# Patient Record
Sex: Female | Born: 1945 | Race: White | Hispanic: No | Marital: Married | Smoking: Never smoker
Health system: Southern US, Community
[De-identification: ages and names within clinical notes are randomized; demographics above are authoritative.]

## PROBLEM LIST (undated history)

## (undated) DIAGNOSIS — M1990 Unspecified osteoarthritis, unspecified site: Secondary | ICD-10-CM

## (undated) DIAGNOSIS — I639 Cerebral infarction, unspecified: Secondary | ICD-10-CM

## (undated) DIAGNOSIS — T7840XA Allergy, unspecified, initial encounter: Secondary | ICD-10-CM

## (undated) HISTORY — DX: Unspecified osteoarthritis, unspecified site: M19.90

## (undated) HISTORY — DX: Allergy, unspecified, initial encounter: T78.40XA

## (undated) HISTORY — DX: Cerebral infarction, unspecified: I63.9

---

## 1998-08-07 ENCOUNTER — Ambulatory Visit (HOSPITAL_BASED_OUTPATIENT_CLINIC_OR_DEPARTMENT_OTHER): Admission: RE | Admit: 1998-08-07 | Discharge: 1998-08-07 | Payer: Self-pay | Admitting: Plastic Surgery

## 1999-01-28 ENCOUNTER — Other Ambulatory Visit: Admission: RE | Admit: 1999-01-28 | Discharge: 1999-01-28 | Payer: Self-pay | Admitting: Obstetrics & Gynecology

## 1999-04-08 ENCOUNTER — Emergency Department (HOSPITAL_COMMUNITY): Admission: EM | Admit: 1999-04-08 | Discharge: 1999-04-08 | Payer: Self-pay | Admitting: Emergency Medicine

## 1999-04-08 ENCOUNTER — Encounter: Payer: Self-pay | Admitting: Emergency Medicine

## 2000-03-09 ENCOUNTER — Other Ambulatory Visit: Admission: RE | Admit: 2000-03-09 | Discharge: 2000-03-09 | Payer: Self-pay | Admitting: Obstetrics & Gynecology

## 2000-03-10 ENCOUNTER — Other Ambulatory Visit: Admission: RE | Admit: 2000-03-10 | Discharge: 2000-03-10 | Payer: Self-pay | Admitting: Obstetrics & Gynecology

## 2000-03-10 ENCOUNTER — Encounter (INDEPENDENT_AMBULATORY_CARE_PROVIDER_SITE_OTHER): Payer: Self-pay

## 2001-07-20 ENCOUNTER — Other Ambulatory Visit: Admission: RE | Admit: 2001-07-20 | Discharge: 2001-07-20 | Payer: Self-pay | Admitting: Obstetrics & Gynecology

## 2001-07-27 ENCOUNTER — Ambulatory Visit (HOSPITAL_BASED_OUTPATIENT_CLINIC_OR_DEPARTMENT_OTHER): Admission: RE | Admit: 2001-07-27 | Discharge: 2001-07-27 | Payer: Self-pay | Admitting: Plastic Surgery

## 2001-07-27 ENCOUNTER — Encounter (INDEPENDENT_AMBULATORY_CARE_PROVIDER_SITE_OTHER): Payer: Self-pay | Admitting: *Deleted

## 2002-07-18 ENCOUNTER — Ambulatory Visit (HOSPITAL_BASED_OUTPATIENT_CLINIC_OR_DEPARTMENT_OTHER): Admission: RE | Admit: 2002-07-18 | Discharge: 2002-07-18 | Payer: Self-pay | Admitting: Plastic Surgery

## 2002-07-19 ENCOUNTER — Encounter (INDEPENDENT_AMBULATORY_CARE_PROVIDER_SITE_OTHER): Payer: Self-pay | Admitting: *Deleted

## 2003-01-21 ENCOUNTER — Other Ambulatory Visit: Admission: RE | Admit: 2003-01-21 | Discharge: 2003-01-21 | Payer: Self-pay | Admitting: Obstetrics & Gynecology

## 2004-04-22 ENCOUNTER — Other Ambulatory Visit: Admission: RE | Admit: 2004-04-22 | Discharge: 2004-04-22 | Payer: Self-pay | Admitting: Obstetrics & Gynecology

## 2004-06-19 ENCOUNTER — Ambulatory Visit (HOSPITAL_COMMUNITY): Admission: RE | Admit: 2004-06-19 | Discharge: 2004-06-19 | Payer: Self-pay | Admitting: Obstetrics & Gynecology

## 2004-06-19 ENCOUNTER — Encounter (INDEPENDENT_AMBULATORY_CARE_PROVIDER_SITE_OTHER): Payer: Self-pay | Admitting: Specialist

## 2004-10-26 ENCOUNTER — Other Ambulatory Visit: Admission: RE | Admit: 2004-10-26 | Discharge: 2004-10-26 | Payer: Self-pay | Admitting: Obstetrics & Gynecology

## 2005-04-22 ENCOUNTER — Ambulatory Visit (HOSPITAL_BASED_OUTPATIENT_CLINIC_OR_DEPARTMENT_OTHER): Admission: RE | Admit: 2005-04-22 | Discharge: 2005-04-22 | Payer: Self-pay | Admitting: Plastic Surgery

## 2005-04-22 ENCOUNTER — Encounter (INDEPENDENT_AMBULATORY_CARE_PROVIDER_SITE_OTHER): Payer: Self-pay | Admitting: Specialist

## 2007-01-07 ENCOUNTER — Emergency Department (HOSPITAL_COMMUNITY): Admission: EM | Admit: 2007-01-07 | Discharge: 2007-01-07 | Payer: Self-pay | Admitting: Emergency Medicine

## 2007-01-25 ENCOUNTER — Ambulatory Visit (HOSPITAL_COMMUNITY): Admission: RE | Admit: 2007-01-25 | Discharge: 2007-01-25 | Payer: Self-pay | Admitting: Cardiology

## 2010-02-14 ENCOUNTER — Encounter: Payer: Self-pay | Admitting: Family Medicine

## 2010-06-09 NOTE — Cardiovascular Report (Signed)
Latoya Wright, Latoya Wright                 ACCOUNT NO.:  [id number]   MEDICAL RECORD NO.:  [id number]          PATIENT TYPE:  OIB   LOCATION:  2899                         FACILITY:  [facility]   PHYSICIAN:  Armanda Magic, M.D.     DATE OF BIRTH:  [birth year]   DATE OF PROCEDURE:  01/25/2007  DATE OF DISCHARGE:                            CARDIAC CATHETERIZATION   PROCEDURE PERFORMED:  Left heart catheterization, coronary  angiography,left ventriculography.   INDICATIONS:  Chest pain.   COMPLICATIONS:  None.   IV ACCESS:  Via right femoral artery 6-French sheath.   IV MEDICATIONS:  Fentanyl 50 mcg IV.   INDICATIONS FOR PROCEDURE:  This is a very pleasant 65 year old female  with no prior cardiac history who presented with episodes of substernal  chest pain felt initially to be musculoskeletal but she did not any  better with Motrin.  Stress Cardiolite study did not show any  significant ischemic area as there was a very small fixed defect in the  inferior apex. Given her persistence of chest pain she now presents for  cardiac catheterization.   DESCRIPTION OF PROCEDURE:  The patient is brought to cardiac  catheterization laboratory in a fasting, nonsedated state.  Informed  consent was obtained.  The patient was connected to continuous heart  rate, pulse oximetry monitoring and blood pressure monitor.  The right  groin was prepped and draped in sterile fashion.  A 1% Xylocaine was  used for local anesthesia.  Using modified Seldinger technique a 6-  French sheath was placed in right femoral artery.  Under fluoroscopic  guidance a 6-French JL-4 catheter was placed left coronary artery.  Multiple cine films were taken at 30 degree  RAO, 40 degree LAO views.  Catheter was exchanged out over a guidewire for 6-French JR-4 catheter  which was placed in fluoroscopic guidance the right coronary artery.  Multiple cine films were taken at 30 degree RAO, 40 degree LAO views.  Catheter was exchanged out  over a guidewire for a 6-French angled  pigtail catheter which was placed with fluoroscopic guidance  into the  left ventricular cavity.  Left ventriculography was performed in the 30  degree RAO view using total of 30 mL of contrast at 15 mL per second.  The catheter was then pulled back across the aortic valve with no  significant gradient noted.  At the end procedure all catheters and  sheaths were removed.  Manual compression was performed until adequate  hemostasis was obtained.  The patient transferred back to room in stable  condition.   RESULTS:  1. The left main coronary artery is widely patent and bifurcates into      left anterior descending artery and left circumflex artery.  2. The left anterior descending artery is widely patent throughout its      course.  The apex giving rise to diagonal branches both of which      are widely patent.  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  3. The left  circumflex is widely patent and traverses the AV groove,      it gives rise to a moderate size OM1 branch which bifurcates into      daughter branches and is widely patent.  4. The right coronary artery is widely patent throughout its course      and bifurcates distally in a posterior descending artery and      posterior lateral artery, both of which are widely patent.  5. Left ventriculography shows hyperdynamic LV function, EF 70%, LV      pressure 148 over 3 mmHg, aortic pressure 147/64 mmHg, LVEDP 10      mmHg.   ASSESSMENT:  1. Normal  coronary arteries.  2. Hyperdynamic left ventricle function.  3. 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   PLAN:  Discharge to home after IV fluid and bedrest. Add Toprol XL 25 mg  a day for LV relaxation to help decrease the extent of the myocardial  bridging of  the left anterior descending artery, start Nexium 40 mg a  day.  Follow up with me in 2 weeks.      Armanda Magic, M.D.  Electronically Signed     TT/MEDQ  D:  01/25/2007  T:  01/25/2007  Job:  [id number]

## 2010-06-12 NOTE — Op Note (Signed)
NAMEAINSLIE, MAZUREK                 ACCOUNT NO.:  [id number]   MEDICAL RECORD NO.:  [id number]          PATIENT TYPE:  AMB   LOCATION:  SDC                           FACILITY:  [facility]   PHYSICIAN:  Gerrit Friends. Aldona Bar, M.D.   DATE OF BIRTH:  March 18, 1945   DATE OF PROCEDURE:  06/19/2004  DATE OF DISCHARGE:                                 OPERATIVE REPORT   The patient is age 65.   PREOPERATIVE DIAGNOSIS:  Dysplasia of the cervix.   POSTOPERATIVE DIAGNOSES:  Dysplasia of the cervix.  Pathology pending.   PROCEDURE:  Conization of the cervix.   SURGEON:  Gerrit Friends. Aldona Bar, M.D.   ANESTHESIA:  Intravenous conscious sedation and local with 1% Xylocaine with  epinephrine circumferentially about the cervix.   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 Colposcopy, biopsy and ECC was  carried out in April 2006. 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 She is now being taken to the operating room  for definitive diagnosis and therapy by having conization of the cervix.   DESCRIPTION OF PROCEDURE:  The patient was taken to the operating room and  after the induction of intravenous conscious sedation, she was placed in the  modified lithotomy position short in short Allen stirrups. A speculum was  placed in the vagina, single tooth tenaculum placed on the anterior lip. The  cervix at this time was circumferentially injected with approximately 18 mL  of 1% Xylocaine with epinephrine. At this time using the large loop, the  exocervix was removed and appropriately identified - the anterior portion  the exocervix was removed followed by the posterior portion of the exocervix  and these were pinned out and appropriately labeled. The os was sounded at  this time and using the medium loop, an endocervical button was taken to  likewise appropriately pinned out. The os was noted to be patent and  essentially the endocervix almost was entirely removed.  Thereafter using  the coagulating ball, the cervical bed was adequately coagulated. Hemostasis  was very adequate.  For insurance, Monsel solution was placed in the cervix.  The procedure at this time was felt to be complete and was terminated.  The  patient was taken to the recovery room in satisfactory condition having  tolerated the procedure well. Estimated blood loss negligible. All counts  correct x2. Pathologic specimen consisted of the cone appropriately pinned  out.  The patient will be discharged to home with appropriate instructions and  return to the office for follow-up in approximately three weeks' time or as  needed.   CONDITION ON ARRIVAL TO RECOVERY:  Satisfactory.      RMW/MEDQ  D:  06/19/2004  T:  06/19/2004  Job:  [id number]

## 2010-11-02 LAB — POCT I-STAT CREATININE
Creatinine, Ser: 0.9
Operator id: 196461

## 2010-11-02 LAB — I-STAT 8, (EC8 V) (CONVERTED LAB)
BUN: 14
Chloride: 109
Hemoglobin: 14.6
Operator id: 196461
Sodium: 140

## 2010-11-02 LAB — CBC
HCT: 41
Hemoglobin: 14.1
MCHC: 34.3
MCV: 88.4
Platelets: 290
RBC: 4.64
RDW: 12.6
WBC: 7.3

## 2010-11-02 LAB — POCT CARDIAC MARKERS
Operator id: 196461
Troponin i, poc: <0.05

## 2011-10-11 ENCOUNTER — Other Ambulatory Visit: Payer: Self-pay | Admitting: Plastic Surgery

## 2012-09-06 LAB — HM DEXA SCAN

## 2012-09-12 LAB — HM COLONOSCOPY

## 2013-01-16 LAB — HM MAMMOGRAPHY

## 2013-10-31 ENCOUNTER — Ambulatory Visit (HOSPITAL_COMMUNITY)
Admission: RE | Admit: 2013-10-31 | Discharge: 2013-10-31 | Disposition: A | Discharge status: Home / Self Care | Payer: Medicare Other | Source: Ambulatory Visit | Attending: Emergency Medicine | Admitting: Emergency Medicine

## 2013-10-31 ENCOUNTER — Ambulatory Visit (INDEPENDENT_AMBULATORY_CARE_PROVIDER_SITE_OTHER): Payer: Medicare Other

## 2013-10-31 ENCOUNTER — Ambulatory Visit (INDEPENDENT_AMBULATORY_CARE_PROVIDER_SITE_OTHER): Payer: Medicare Other | Admitting: Emergency Medicine

## 2013-10-31 ENCOUNTER — Encounter (HOSPITAL_COMMUNITY): Payer: Self-pay

## 2013-10-31 VITALS — BP 138/60 | HR 76 | Temp 98.1°F | Resp 16 | Ht 67.5 in | Wt 110.8 lb

## 2013-10-31 DIAGNOSIS — R0781 Pleurodynia: Secondary | ICD-10-CM | POA: Diagnosis not present

## 2013-10-31 DIAGNOSIS — R05 Cough: Secondary | ICD-10-CM

## 2013-10-31 DIAGNOSIS — R7989 Other specified abnormal findings of blood chemistry: Secondary | ICD-10-CM

## 2013-10-31 DIAGNOSIS — R059 Cough, unspecified: Secondary | ICD-10-CM

## 2013-10-31 DIAGNOSIS — J040 Acute laryngitis: Secondary | ICD-10-CM

## 2013-10-31 DIAGNOSIS — R791 Abnormal coagulation profile: Secondary | ICD-10-CM

## 2013-10-31 LAB — POCT CBC
GRANULOCYTE PERCENT: 76.1 % (ref 37–80)
HEMATOCRIT: 37.7 % (ref 37.7–47.9)
HEMOGLOBIN: 12.1 g/dL — AB (ref 12.2–16.2)
Lymph, poc: 1.9 (ref 0.6–3.4)
MCH, POC: 29 pg (ref 27–31.2)
MCHC: 32.2 g/dL (ref 31.8–35.4)
MCV: 90 fL (ref 80–97)
MID (cbc): 0.8 (ref 0–0.9)
MPV: 8.3 fL (ref 0–99.8)
POC GRANULOCYTE: 8.5 — AB (ref 2–6.9)
POC LYMPH PERCENT: 16.7 %L (ref 10–50)
POC MID %: 7.2 % (ref 0–12)
Platelet Count, POC: 210 10*3/uL (ref 142–424)
RBC: 4.19 M/uL (ref 4.04–5.48)
RDW, POC: 14.4 %
WBC: 11.2 10*3/uL — AB (ref 4.6–10.2)

## 2013-10-31 LAB — D-DIMER, QUANTITATIVE (NOT AT ARMC): D DIMER QUANT: 1.13 ug{FEU}/mL — AB (ref 0.00–0.48)

## 2013-10-31 LAB — POCT I-STAT CREATININE: CREATININE: 0.7 mg/dL (ref 0.50–1.10)

## 2013-10-31 MED ORDER — IOHEXOL 350 MG/ML SOLN
100.0000 mL | Freq: Once | INTRAVENOUS | Status: AC | PRN
  Administered 2013-10-31: 100 mL via INTRAVENOUS

## 2013-10-31 MED ORDER — BENZONATATE 100 MG PO CAPS: 100.0000 mg | ORAL_CAPSULE | Freq: Three times a day (TID) | ORAL | Status: DC | PRN

## 2013-10-31 MED ORDER — LEVOFLOXACIN 500 MG PO TABS: 500.0000 mg | ORAL_TABLET | Freq: Every day | ORAL | Status: AC

## 2013-10-31 NOTE — Patient Instructions (Signed)
Cough, Adult  A cough is a reflex that helps clear your throat and airways. It can help heal the body or may be a reaction to an irritated airway. A cough may only last 2 or 3 weeks (acute) or may last more than 8 weeks (chronic).  CAUSES Acute cough:  Viral or bacterial infections. Chronic cough:  Infections.  Allergies.  Asthma.  Post-nasal drip.  Smoking.  Heartburn or acid reflux.  Some medicines.  Chronic lung problems (COPD).  Cancer. SYMPTOMS   Cough.  Fever.  Chest pain.  Increased breathing rate.  High-pitched whistling sound when breathing (wheezing).  Colored mucus that you cough up (sputum). TREATMENT   A bacterial cough may be treated with antibiotic medicine.  A viral cough must run its course and will not respond to antibiotics.  Your caregiver may recommend other treatments if you have a chronic cough. HOME CARE INSTRUCTIONS   Only take over-the-counter or prescription medicines for pain, discomfort, or fever as directed by your caregiver. Use cough suppressants only as directed by your caregiver.  Use a cold steam vaporizer or humidifier in your bedroom or home to help loosen secretions.  Sleep in a semi-upright position if your cough is worse at night.  Rest as needed.  Stop smoking if you smoke. SEEK IMMEDIATE MEDICAL CARE IF:   You have pus in your sputum.  Your cough starts to worsen.  You cannot control your cough with suppressants and are losing sleep.  You begin coughing up blood.  You have difficulty breathing.  You develop pain which is getting worse or is uncontrolled with medicine.  You have a fever. MAKE SURE YOU:   Understand these instructions.  Will watch your condition.  Will get help right away if you are not doing well or get worse. Document Released: 07/10/2010 Document Revised: 04/05/2011 Document Reviewed: 07/10/2010 [facility] Patient Information 2015 [place], [place]. This information is not intended  to replace advice given to you by your health care provider. Make sure you discuss any questions you have with your health care provider. Pleurisy Pleurisy is an inflammation and swelling of the lining of the lungs (pleura). Because of this inflammation, it hurts to breathe. It can be aggravated by coughing, laughing, or deep breathing. Pleurisy is often caused by an underlying infection or disease.  HOME CARE INSTRUCTIONS  Monitor your pleurisy for any changes. The following actions may help to alleviate any discomfort you are experiencing:  Medicine may help with pain. Only take over-the-counter or prescription medicines for pain, discomfort, or fever as directed by your health care provider.  Only take antibiotic medicine as directed. Make sure to finish it even if you start to feel better. SEEK MEDICAL CARE IF:   Your pain is not controlled with medicine or is increasing.  You have an increase in pus-like (purulent) secretions brought up with coughing. SEEK IMMEDIATE MEDICAL CARE IF:   You have blue or dark lips, fingernails, or toenails.  You are coughing up blood.  You have increased difficulty breathing.  You have continuing pain unrelieved by medicine or pain lasting more than 1 week.  You have pain that radiates into your neck, arms, or jaw.  You develop increased shortness of breath or wheezing.  You develop a fever, rash, vomiting, fainting, or other serious symptoms. MAKE SURE YOU:  Understand these instructions.   Will watch your condition.   Will get help right away if you are not doing well or get worse.  Document Released:  01/11/2005 Document Revised: 09/13/2012 Document Reviewed: 06/25/2012 [facility] Patient Information 2015 [place], [place]. This information is not intended to replace advice given to you by your health care provider. Make sure you discuss any questions you have with your health care provider.

## 2013-10-31 NOTE — Progress Notes (Signed)
Subjective:    Patient ID: Latoya Wright, female    DOB: 10-04-1945, 68 y.o.   MRN: [id number] This chart was scribed for Arlyss Queen, MD by Marti Sleigh, Medical Scribe. This patient was seen in Room 13 and the patient's care was started at 10:55 AM.  HPI HPI Comments: Latoya Wright is a 68 y.o. female who presents to [facility] complaining of constant right-sided chest pain that started last night. Pt states she had sinus congestion and lost her voice on three days ago. Pt states that she also experienced severe constipation that started two weeks ago, and has since improved with regular laxatives. Pt states she is experiencing SOB as an associated symptom of her chest pain. Pt denies long travel by car/train/plane, leg swelling, fever, or chills. Pt endorses sick contacts with two young children.    Review of Systems  Constitutional: Negative for chills, diaphoresis, appetite change and fatigue.  HENT: Positive for congestion, rhinorrhea, sinus pressure and voice change. Negative for ear discharge.   Eyes: Negative for discharge.  Respiratory: Negative for cough.   Cardiovascular: Negative for chest pain.  Gastrointestinal: Positive for abdominal pain (Mild) and constipation. Negative for diarrhea.  Genitourinary: Negative for frequency and hematuria.  Musculoskeletal: Negative for back pain.  Skin: Negative for rash.  Neurological: Negative for seizures and headaches.  Psychiatric/Behavioral: Negative for hallucinations.       Objective:   Physical Exam  Constitutional: She is oriented to person, place, and time. She appears well-developed and well-nourished. No distress.  HENT:  Head: Normocephalic.  Eyes: Conjunctivae and EOM are normal. No scleral icterus.  Neck: Neck supple. No thyromegaly present.  Cardiovascular: Normal rate and regular rhythm.  Exam reveals no gallop and no friction rub.   No murmur heard. Pulmonary/Chest: No stridor.  Decreased breath sounds on right, with  occasional rhonchi.  Abdominal: She exhibits no distension. There is no tenderness. There is no rebound.  Musculoskeletal: Normal range of motion. She exhibits no edema.  Lymphadenopathy:    She has no cervical adenopathy.  Neurological: She is oriented to person, place, and time. She exhibits normal muscle tone. Coordination normal.  Skin: No rash noted. No erythema.  Psychiatric: She has a normal mood and affect. Her behavior is normal.   Results for orders placed in visit on 10/31/13  POCT CBC      Result Value Ref Range   WBC 11.2 (*) 4.6 - 10.2 K/uL   Lymph, poc 1.9  0.6 - 3.4   POC LYMPH PERCENT 16.7  10 - 50 %L   MID (cbc) 0.8  0 - 0.9   POC MID % 7.2  0 - 12 %M   POC Granulocyte 8.5 (*) 2 - 6.9   Granulocyte percent 76.1  37 - 80 %G   RBC 4.19  4.04 - 5.48 M/uL   Hemoglobin 12.1 (*) 12.2 - 16.2 g/dL   HCT, POC 37.7  37.7 - 47.9 %   MCV 90.0  80 - 97 fL   MCH, POC 29.0  27 - 31.2 pg   MCHC 32.2  31.8 - 35.4 g/dL   RDW, POC 14.4     Platelet Count, POC 210  142 - 424 K/uL   MPV 8.3  0 - 99.8 fL   [facility] reading (PRIMARY) by  Dr.Tishawn Friedhoff there is increased AP diameter. There are increased markings seen on the lateral with what appears to be scarring in the right apices. Please comment. She is not a  smoker        Assessment & Plan:  I suspect the patient has a bowel illness with Thursday. We'll do a d-dimer to be sure there is no clot. She will be treated with Tessalon Perles fluids and Aleve 2 twice a day. She will followup with her PCP and pulmonary  function testing done in about 2 weeks. I personally performed the services described in this documentation, which was scribed in my presence. The recorded information has been reviewed and is accurate. D. dimer which was done returned elevated patient sent for CT angiogram to rule out clot  CT scan shows multiple pulmonary nodules with underlying emphysema. There is a infiltrate in the right middle lobe and both lower lobes. 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 I placed pt on Levaquin 500 qd #10.   Pt will followup with Dr. Laney Pastor on Friday if not improving.Marland Kitchen She will return to clinic sooner if worsening.

## 2013-11-01 ENCOUNTER — Telehealth: Payer: Self-pay | Admitting: Emergency Medicine

## 2013-11-01 NOTE — Telephone Encounter (Signed)
Called and spoke with the patient. sHe is feeling much better today. She will followup tomorrow if worsening. if she continues to do well I will see her on Monday

## 2013-11-05 ENCOUNTER — Ambulatory Visit (INDEPENDENT_AMBULATORY_CARE_PROVIDER_SITE_OTHER): Payer: Medicare Other

## 2013-11-05 ENCOUNTER — Ambulatory Visit (INDEPENDENT_AMBULATORY_CARE_PROVIDER_SITE_OTHER): Payer: Medicare Other | Admitting: Emergency Medicine

## 2013-11-05 VITALS — BP 132/68 | HR 91 | Temp 97.8°F | Resp 18 | Ht 67.5 in | Wt 110.0 lb

## 2013-11-05 DIAGNOSIS — J438 Other emphysema: Secondary | ICD-10-CM | POA: Insufficient documentation

## 2013-11-05 DIAGNOSIS — J189 Pneumonia, unspecified organism: Secondary | ICD-10-CM

## 2013-11-05 DIAGNOSIS — R160 Hepatomegaly, not elsewhere classified: Secondary | ICD-10-CM

## 2013-11-05 DIAGNOSIS — E041 Nontoxic single thyroid nodule: Secondary | ICD-10-CM

## 2013-11-05 LAB — POCT CBC
GRANULOCYTE PERCENT: 74.2 % (ref 37–80)
HEMATOCRIT: 40.3 % (ref 37.7–47.9)
HEMOGLOBIN: 12.7 g/dL (ref 12.2–16.2)
LYMPH, POC: 1.5 (ref 0.6–3.4)
MCH, POC: 28.6 pg (ref 27–31.2)
MCHC: 31.6 g/dL — AB (ref 31.8–35.4)
MCV: 90.4 fL (ref 80–97)
MID (cbc): 0.5 (ref 0–0.9)
MPV: 7.5 fL (ref 0–99.8)
POC GRANULOCYTE: 5.8 (ref 2–6.9)
POC LYMPH %: 19.8 % (ref 10–50)
POC MID %: 6 %M (ref 0–12)
Platelet Count, POC: 329 10*3/uL (ref 142–424)
RBC: 4.45 M/uL (ref 4.04–5.48)
RDW, POC: 14.2 %
WBC: 7.8 10*3/uL (ref 4.6–10.2)

## 2013-11-05 LAB — POCT SEDIMENTATION RATE: POCT SED RATE: 40 mm/h — AB (ref 0–22)

## 2013-11-05 LAB — ANGIOTENSIN CONVERTING ENZYME: ANGIOTENSIN-CONVERTING ENZYME: 40 U/L (ref 8–52)

## 2013-11-05 NOTE — Progress Notes (Signed)
Subjective:    Patient ID: Latoya Wright, female    DOB: May 24, 1945, 68 y.o.   MRN: [id number]  This chart was scribed for Arlyss Queen, MD by Marti Sleigh, Medical Scribe. This patient was seen in Room 2 and the patient's care was started at 9:07 AM.   HPI HPI Comments: Latoya Wright is a 68 y.o. female who presents to [facility] for a follow up appointment. Pt complains of continued cough that is worst at night. Pt endorses intermittent CP, and coughing up purulent drainage in the morning, as associated symptoms. Pt states she has been compliant with her abx medication. Pt denies SOB. Pt states she is taking Biotin 1000 mg, Calcium 1200 mg, Vitimin D3 2000 iu, all daily.    Review of Systems  Constitutional: Negative for fever.  HENT: Positive for congestion and voice change.   Respiratory: Positive for chest tightness. Negative for shortness of breath.   Cardiovascular: Positive for chest pain. Negative for leg swelling.  Gastrointestinal: Negative for constipation.  Genitourinary: Negative for dysuria and difficulty urinating.  Musculoskeletal: Negative for neck pain.  Skin: Negative for rash.  Neurological: Negative for numbness and headaches.  Psychiatric/Behavioral: Negative for agitation.       Objective:   Physical Exam  Nursing note and vitals reviewed. Constitutional: She is oriented to person, place, and time. She appears well-developed and well-nourished.  HENT:  Head: Normocephalic and atraumatic.  Eyes: Pupils are equal, round, and reactive to light.  Neck: No JVD present.  No thyroid nodule pappable.  Cardiovascular: Normal rate, regular rhythm and normal heart sounds.   Pulmonary/Chest: Effort normal and breath sounds normal.  Dry rales in both bases.  Neurological: She is alert and oriented to person, place, and time.  Skin: Skin is warm and dry.  Psychiatric: She has a normal mood and affect. Her behavior is normal.   Results for orders placed in visit on 11/05/13    POCT CBC      Result Value Ref Range   WBC 7.8  4.6 - 10.2 K/uL   Lymph, poc 1.5  0.6 - 3.4   POC LYMPH PERCENT 19.8  10 - 50 %L   MID (cbc) 0.5  0 - 0.9   POC MID % 6.0  0 - 12 %M   POC Granulocyte 5.8  2 - 6.9   Granulocyte percent 74.2  37 - 80 %G   RBC 4.45  4.04 - 5.48 M/uL   Hemoglobin 12.7  12.2 - 16.2 g/dL   HCT, POC 40.3  37.7 - 47.9 %   MCV 90.4  80 - 97 fL   MCH, POC 28.6  27 - 31.2 pg   MCHC 31.6 (*) 31.8 - 35.4 g/dL   RDW, POC 14.2     Platelet Count, POC 329  142 - 424 K/uL   MPV 7.5  0 - 99.8 fL   [facility] reading (PRIMARY) by  Dr. Everlene Farrier this shows changes of COPD there is a minimal right middle lobe infiltrate adjacent to the lower right heart border       Assessment & Plan:  Patient is improved on Levaquin and Tessalon Perles. 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 The recorded information has been reviewed and is accurate.

## 2013-11-07 LAB — ALPHA-1-ANTITRYPSIN: A-1 Antitrypsin, Ser: 185 mg/dL (ref 83–199)

## 2013-11-09 ENCOUNTER — Ambulatory Visit (INDEPENDENT_AMBULATORY_CARE_PROVIDER_SITE_OTHER): Payer: Medicare Other | Admitting: Pulmonary Disease

## 2013-11-09 ENCOUNTER — Encounter: Payer: Self-pay | Admitting: Pulmonary Disease

## 2013-11-09 VITALS — BP 122/64 | HR 78 | Ht 68.25 in | Wt 114.0 lb

## 2013-11-09 DIAGNOSIS — R059 Cough, unspecified: Secondary | ICD-10-CM

## 2013-11-09 DIAGNOSIS — J189 Pneumonia, unspecified organism: Secondary | ICD-10-CM

## 2013-11-09 DIAGNOSIS — R05 Cough: Secondary | ICD-10-CM

## 2013-11-09 DIAGNOSIS — J438 Other emphysema: Secondary | ICD-10-CM

## 2013-11-09 NOTE — Assessment & Plan Note (Signed)
Voice rest encouraged

## 2013-11-09 NOTE — Progress Notes (Signed)
Subjective:    Patient ID: Latoya Wright, female    DOB: December 31, 1945, 68 y.o.   MRN: [id number]  HPI Chief Complaint  Patient presents with  . Advice Only    Referred by Latoya Wright for pna Xfew weeks.    Latoya Wright is here to see me for pneumonia.  She had a CT scan recently that showed pneumonia so she was referred to me for the same.  She had a list of other abnormalities from the CT as well..  She started having a hacking cough about 2-3 weeks ago and she had some hoarseness.  She had been to [facility] urgent care at the time for abdominal complaints around this time.  The abdominal complaints resolved but the sinus congestion, hoarseness and cough persisted.  Then suddenly last Tuesday she developed the sudden onset of pressure throughout her right chest.  She had a Chest X-ray that showed pneumonia.  She then had a CT angiogram of her chest that was negative for PE but showed RML pneumonia and possibly emphysema and mild bronchiectasis.  She has had complete resolution of her symptoms with the exception of a mild nagging dry cough.    She had pneumonia many years ago about thirty years ago but was not hospitalized for this.    Her childhood was normal without respiratory illnesses.   Past Medical History  Diagnosis Date  . Allergy   . Arthritis      Family History  Problem Relation Age of Onset  . Heart disease Father   . Heart disease Maternal Grandmother   . Heart disease Maternal Grandfather   . Heart disease Paternal Grandmother   . Heart disease Paternal Grandfather   . Rheum arthritis Sister   . Rheum arthritis Brother      History   Social History  . Marital Status: Married    Spouse Name: N/A    Number of Children: N/A  . Years of Education: N/A   Occupational History  . Not on file.   Social History Main Topics  . Smoking status: Never Smoker   . Smokeless tobacco: Never Used  . Alcohol Use: No  . Drug Use: No  . Sexual Activity: Not on file   Other  Topics Concern  . Not on file   Social History Narrative  . No narrative on file     Allergies  Allergen Reactions  . Dayquil [Pseudoephedrine-Apap-Dm]     jittery     Outpatient Prescriptions Prior to Visit  Medication Sig Dispense Refill  . aspirin 81 MG tablet Take 81 mg by mouth daily.      . benzonatate (TESSALON) 100 MG capsule Take 1-2 capsules (100-200 mg total) by mouth 3 (three) times daily as needed for cough.  40 capsule  0  . Biotin 1 MG CAPS Take by mouth.      . Calcium Carbonate-Vit D-Min (CALCIUM 1200 PO) Take by mouth.      . levofloxacin (LEVAQUIN) 500 MG tablet Take 1 tablet (500 mg total) by mouth daily.  10 tablet  0   No facility-administered medications prior to visit.       Review of Systems  Constitutional: Negative for fever and unexpected weight change.  HENT: Negative for congestion, dental problem, ear pain, nosebleeds, postnasal drip, rhinorrhea, sinus pressure, sneezing, sore throat and trouble swallowing.   Eyes: Negative for redness and itching.  Respiratory: Positive for cough. Negative for chest tightness, shortness of breath and wheezing.  Cardiovascular: Negative for palpitations and leg swelling.  Gastrointestinal: Negative for nausea and vomiting.  Genitourinary: Negative for dysuria.  Musculoskeletal: Negative for joint swelling.  Skin: Negative for rash.  Neurological: Negative for headaches.  Hematological: Does not bruise/bleed easily.  Psychiatric/Behavioral: Negative for dysphoric mood. The patient is not nervous/anxious.        Objective:   Physical Exam Filed Vitals:   11/09/13 1118  BP: 122/64  Pulse: 78  Height: 5' 8.25" (1.734 m)  Weight: 114 lb (51.71 kg)  SpO2: 100%    RA  Gen: well appearing, no acute distress HEENT: NCAT, PERRL, EOMi, OP clear, neck supple without masses PULM: CTA B CV: RRR, no mgr, no JVD AB: BS+, soft, nontender, no hsm Ext: warm, no edema, no clubbing, no cyanosis Derm: no rash or  skin breakdown Neuro: A&Ox4, CN II-XII intact, strength 5/5 in all 4 extremities  10/2013 CXR and CT chest reviewed> RML pneumonia, RLL mild ground glass and nodularity in one focal "patch", no clear emphysema or significant bronchiectasis per my review      Assessment & Plan:   Other emphysema I really cannot appreciate much bronchiectasis or emphysema on the CT chest as suggested by radiology. If she has either they are subtle.  If she does have bronchiectasis it is very subtle and likely due to a case of pneumonia she had about 25 years ago.  Because she is healthy aside from the recent pneumonia I don't see an indication to work this up further aside from an alpha-1 level.  Plan: -send alpha-1 anti-trypsin  CAP (community acquired pneumonia) She had right middle lobe pneumonia seen on her recent chest x-ray.  Currently her symptoms fit the normal natural history of community acquired pneumonia and she is currently getting better.  Looking back, it appears that she had pneumonia in 2009 in the right middle lobe as well.  I wonder if we are looking at a scar that is just not getting better.  It can take weeks for pneumonia to resolve on a chest x-ray.  Plan: -repeat CXR in 2-3 weeks then follow up with me after that -if scar still there, discuss serial CT Scans vs bronchoscopy  Cough Voice rest encouraged    Updated Medication List Outpatient Encounter Prescriptions as of 11/09/2013  Medication Sig  . aspirin 81 MG tablet Take 81 mg by mouth daily.  . benzonatate (TESSALON) 100 MG capsule Take 1-2 capsules (100-200 mg total) by mouth 3 (three) times daily as needed for cough.  . Biotin 1 MG CAPS Take by mouth.  . Calcium Carbonate-Vit D-Min (CALCIUM 1200 PO) Take by mouth.  . cholecalciferol (VITAMIN D) 1000 UNITS tablet Take 2,000 Units by mouth daily.  Marland Kitchen levofloxacin (LEVAQUIN) 500 MG tablet Take 1 tablet (500 mg total) by mouth daily.

## 2013-11-09 NOTE — Assessment & Plan Note (Addendum)
I really cannot appreciate much bronchiectasis or emphysema on the CT chest as suggested by radiology. If she has either they are subtle.  If she does have bronchiectasis it is very subtle and likely due to a case of pneumonia she had about 25 years ago.  Because she is healthy aside from the recent pneumonia I don't see an indication to work this up further aside from an alpha-1 level.  Plan: -send alpha-1 anti-trypsin

## 2013-11-09 NOTE — Patient Instructions (Signed)
We will get a chest x-ray at the end of the month. We will see you back after that.

## 2013-11-09 NOTE — Assessment & Plan Note (Signed)
She had right middle lobe pneumonia seen on her recent chest x-ray.  Currently her symptoms fit the normal natural history of community acquired pneumonia and she is currently getting better.  Looking back, it appears that she had pneumonia in 2009 in the right middle lobe as well.  I wonder if we are looking at a scar that is just not getting better.  It can take weeks for pneumonia to resolve on a chest x-ray.  Plan: -repeat CXR in 2-3 weeks then follow up with me after that -if scar still there, discuss serial CT Scans vs bronchoscopy

## 2013-11-19 ENCOUNTER — Ambulatory Visit
Admission: RE | Admit: 2013-11-19 | Discharge: 2013-11-19 | Disposition: A | Discharge status: Home / Self Care | Payer: Medicare Other | Source: Ambulatory Visit | Attending: Emergency Medicine | Admitting: Emergency Medicine

## 2013-11-19 ENCOUNTER — Ambulatory Visit
Admission: RE | Admit: 2013-11-19 | Discharge: 2013-11-19 | Disposition: A | Discharge status: Home / Self Care | Payer: 59 | Source: Ambulatory Visit | Attending: Emergency Medicine | Admitting: Emergency Medicine

## 2013-11-19 ENCOUNTER — Other Ambulatory Visit: Payer: Self-pay | Admitting: Radiology

## 2013-11-19 DIAGNOSIS — R935 Abnormal findings on diagnostic imaging of other abdominal regions, including retroperitoneum: Secondary | ICD-10-CM

## 2013-11-19 DIAGNOSIS — E041 Nontoxic single thyroid nodule: Secondary | ICD-10-CM

## 2013-11-19 DIAGNOSIS — R160 Hepatomegaly, not elsewhere classified: Secondary | ICD-10-CM

## 2013-11-21 ENCOUNTER — Ambulatory Visit (INDEPENDENT_AMBULATORY_CARE_PROVIDER_SITE_OTHER)
Admission: RE | Admit: 2013-11-21 | Discharge: 2013-11-21 | Disposition: A | Discharge status: Home / Self Care | Payer: Medicare Other | Source: Ambulatory Visit | Attending: Pulmonary Disease | Admitting: Pulmonary Disease

## 2013-11-21 DIAGNOSIS — J438 Other emphysema: Secondary | ICD-10-CM

## 2013-11-27 ENCOUNTER — Encounter: Payer: Self-pay | Admitting: Pulmonary Disease

## 2013-11-27 ENCOUNTER — Ambulatory Visit (INDEPENDENT_AMBULATORY_CARE_PROVIDER_SITE_OTHER): Payer: Medicare Other | Admitting: Pulmonary Disease

## 2013-11-27 VITALS — BP 136/70 | HR 67 | Temp 98.0°F | Ht 68.0 in | Wt 116.0 lb

## 2013-11-27 DIAGNOSIS — J189 Pneumonia, unspecified organism: Secondary | ICD-10-CM

## 2013-11-27 NOTE — Patient Instructions (Signed)
We will see you back in a year with a chest x-ray If you have a case of bronchitis between now and then come back to see [place]

## 2013-11-27 NOTE — Progress Notes (Signed)
   Subjective:    Patient ID: MILYN STAPLETON, female    DOB: 1946/01/19, 68 y.o.   MRN: [id number]  Synopsis: Referred in 2015 for evaluation of right middle lobe and right lower lobe pneumonia. Per radiology there is a suggestion of bronchiectasis and emphysema, however these changes were subtle and not evident on my review (McQuaid). Alpha-1 antitrypsin testing sent and was normal. Lifelong nonsmoker.  HPI  Chief Complaint  Patient presents with  . Follow-up    Pt denies cough, SOB. Pt still has some hoarseness.    11/27/2013 routine follow-up> Mrs. Stonesifer has been doing well since the last visit. She has no shortness of breath, no cough.  She has been active and is feeling well.she does not have chest pain, her weight has been stable.  Past Medical History  Diagnosis Date  . Allergy   . Arthritis      Review of Systems     Objective:   Physical Exam Filed Vitals:   11/27/13 1520  BP: 136/70  Pulse: 67  Temp: 98 F (36.7 C)  TempSrc: Oral  Height: 5\' 8"  (1.727 m)  Weight: 116 lb (52.617 kg)  SpO2: 100%  RA  Gen: well appearing, no acute distress HEENT: NCAT, EOMi, OP clear,  PULM: CTA B CV: RRR, no mgr, no JVD AB: BS+, soft, nontender Ext: warm, no edema, no clubbing, no cyanosis Derm: no rash or skin breakdown Neuro: A&Ox4, MAEW  11/21/2013 CXR> near complete resolution of RML pneumonia      Assessment & Plan:   CAP (community acquired pneumonia) This problem has resolved. She has a mild scar in her right lung which I believe is related to an episode of pneumonia 25 years ago. I do not believe she has bronchiectasis or emphysema as these were not evident to me on her chest CT and her alpha-1 antitrypsin level was normal.  Because of the mild scar in her right lung I would like for her to follow-up in one year with a repeat chest x-ray to make sure that there is no evidence of change.    Updated Medication List Outpatient Encounter Prescriptions as of  11/27/2013  Medication Sig  . aspirin 81 MG tablet Take 81 mg by mouth daily.  . Biotin 1 MG CAPS Take by mouth.  . Calcium Carbonate-Vit D-Min (CALCIUM 1200 PO) Take by mouth.  . cholecalciferol (VITAMIN D) 1000 UNITS tablet Take 2,000 Units by mouth daily.  . benzonatate (TESSALON) 100 MG capsule Take 1-2 capsules (100-200 mg total) by mouth 3 (three) times daily as needed for cough.

## 2013-11-27 NOTE — Progress Notes (Signed)
Quick Note:  Spoke with pt, she is aware of results. ______

## 2013-11-27 NOTE — Assessment & Plan Note (Signed)
This problem has resolved. She has a mild scar in her right lung which I believe is related to an episode of pneumonia 25 years ago. I do not believe she has bronchiectasis or emphysema as these were not evident to me on her chest CT and her alpha-1 antitrypsin level was normal.  Because of the mild scar in her right lung I would like for her to follow-up in one year with a repeat chest x-ray to make sure that there is no evidence of change.

## 2013-12-04 ENCOUNTER — Ambulatory Visit
Admission: RE | Admit: 2013-12-04 | Discharge: 2013-12-04 | Disposition: A | Discharge status: Home / Self Care | Payer: 59 | Source: Ambulatory Visit | Attending: Emergency Medicine | Admitting: Emergency Medicine

## 2013-12-04 DIAGNOSIS — R935 Abnormal findings on diagnostic imaging of other abdominal regions, including retroperitoneum: Secondary | ICD-10-CM

## 2013-12-04 MED ORDER — GADOBENATE DIMEGLUMINE 529 MG/ML IV SOLN
10.0000 mL | Freq: Once | INTRAVENOUS | Status: AC | PRN
  Administered 2013-12-04: 10 mL via INTRAVENOUS

## 2013-12-10 ENCOUNTER — Telehealth: Payer: Self-pay

## 2013-12-10 NOTE — Telephone Encounter (Signed)
When do you recommend follow up?

## 2013-12-10 NOTE — Telephone Encounter (Signed)
Pt will be in on Wednesday to see Dr. Everlene Farrier

## 2013-12-10 NOTE — Telephone Encounter (Signed)
Maybe she can come in Wednesday or this weekend and we can review everything that is been done and arrange for follow-up

## 2013-12-10 NOTE — Telephone Encounter (Signed)
Patient wants to know if Dr. Everlene Farrier needs her to RTC after her latest round of CT Scans and tests.    [phone]

## 2013-12-12 ENCOUNTER — Ambulatory Visit (INDEPENDENT_AMBULATORY_CARE_PROVIDER_SITE_OTHER): Payer: Medicare Other | Admitting: Emergency Medicine

## 2013-12-12 ENCOUNTER — Encounter: Payer: Self-pay | Admitting: Emergency Medicine

## 2013-12-12 VITALS — BP 120/77 | HR 72 | Temp 98.6°F | Resp 12 | Ht 67.75 in | Wt 114.0 lb

## 2013-12-12 DIAGNOSIS — J189 Pneumonia, unspecified organism: Secondary | ICD-10-CM

## 2013-12-12 DIAGNOSIS — Z23 Encounter for immunization: Secondary | ICD-10-CM

## 2013-12-12 DIAGNOSIS — E079 Disorder of thyroid, unspecified: Secondary | ICD-10-CM

## 2013-12-12 DIAGNOSIS — D1803 Hemangioma of intra-abdominal structures: Secondary | ICD-10-CM

## 2013-12-12 DIAGNOSIS — E041 Nontoxic single thyroid nodule: Secondary | ICD-10-CM

## 2013-12-12 LAB — T4, FREE: FREE T4: 0.82 ng/dL (ref 0.80–1.80)

## 2013-12-12 LAB — TSH: TSH: 0.535 u[IU]/mL (ref 0.350–4.500)

## 2013-12-12 NOTE — Progress Notes (Signed)
   Subjective:  This chart was scribed for Darlyne Russian, MD by Ladene Artist, ED Scribe. The patient was seen in room 13. Patient's care was started at 8:44 AM.   Patient ID: Latoya Wright, female    DOB: 03-16-1945, 68 y.o.   MRN: [id number]  Chief Complaint  Patient presents with  . Follow-up    follow up from CT, MRI referral visits for PNA, blood clot, liver issues   HPI HPI Comments: Latoya Wright is a 68 y.o. female who presents to the [facility] for follow-up regarding CT. Pt had MRI of abdomen and [place] of neck done. 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 Pt denies family h/o thyroid disease. She also denies major exposure to radiation or extensive dental work. Pt was also referred to pulmonologist Dr. Lake Bells. Pt reports that she was told that pneumonia had cleared up. He advises that pt have another CXR and [place] in 1 year. Pt reports that she is doing fine overall. She denies SOB or any other complications at this time.   Immunizations  Pt states that she does not get the flu vaccine. Pt has had shingle vaccine. She reports that she had previous pneumococcal vaccine a few years ago. Pt has not had Prevnar but wishes to get vaccine today.   Preventative Maintenance  Pt is scheduled to see GYN in January 2016.   Past Medical History  Diagnosis Date  . Allergy   . Arthritis    Current Outpatient Prescriptions on File Prior to Visit  Medication Sig Dispense Refill  . aspirin 81 MG tablet Take 81 mg by mouth daily.    . Biotin 1 MG CAPS Take by mouth.    . Calcium Carbonate-Vit D-Min (CALCIUM 1200 PO) Take by mouth.     No current facility-administered medications on file prior to visit.   Allergies  Allergen Reactions  . Dayquil [Pseudoephedrine-Apap-Dm]     jittery    Review of Systems  Constitutional: Negative for fever.  Respiratory: Negative  for shortness of breath.       Objective:   Physical Exam CONSTITUTIONAL: Well developed/well nourished HEAD: Normocephalic/atraumatic EYES: EOMI/PERRL ENMT: Mucous membranes moist NECK: supple no meningeal signs, no thyroid nodule palpable  SPINE/BACK:entire spine nontender CV: S1/S2 noted, no murmurs/rubs/gallops noted LUNGS: Lungs are clear to auscultation bilaterally, no apparent distress ABDOMEN: soft, nontender, no rebound or guarding, bowel sounds noted throughout abdomen GU:no cva tenderness NEURO: Pt is awake/alert/appropriate, moves all extremitiesx4.  No facial droop.   EXTREMITIES: pulses normal/equal, full ROM SKIN: warm, color normal PSYCH: no abnormalities of mood noted, alert and oriented to situation    Assessment & Plan:  Pt doing well post treatment for pneumonia. She has had pulmonary follow-up with Dr. Lake Bells. Her MRI of abdomen showed hemangioma of liver and benign renal lesion but no suspicious lesion. [place] of neck showed dominant R sided nodule. [place] recommended to be repeated in 1 year. Prevnar to be given today. She has had the previous pneumococcal vaccine.   I personally performed the services described in this documentation, which was scribed in my presence. The recorded information has been reviewed and is accurate.

## 2013-12-31 ENCOUNTER — Telehealth: Payer: Self-pay | Admitting: Radiology

## 2013-12-31 NOTE — Telephone Encounter (Signed)
Latoya Wright has Left message for patient to see if she wants to come in for her flu shot

## 2014-03-06 ENCOUNTER — Other Ambulatory Visit: Payer: Self-pay | Admitting: Plastic Surgery

## 2014-03-12 ENCOUNTER — Telehealth: Payer: Self-pay

## 2014-03-12 NOTE — Telephone Encounter (Signed)
Spoke with pt, she states she was having a constipation problem when she had pneumonia. Taking different laxatives, 5 gummies daily, stool softners and Ducolax. She states this is the third day and she has not made a bowel movement. She would like to see if you can Rx something for her constipation to help her. Please advise.

## 2014-03-12 NOTE — Telephone Encounter (Signed)
Have the patient take a dose of MiraLAX every day and if not improved by this weekend I will be happy to see her. I work Friday Saturday and Sunday 8 to 4

## 2014-03-12 NOTE — Telephone Encounter (Signed)
Pt would like to speak with Dr.Daub asst about an issue that has been bothering her, didn't want to go into detail with me. Please call [phone]

## 2014-03-13 NOTE — Telephone Encounter (Signed)
Spoke with pt, advised message from Dr. Everlene Farrier, pt agreed.

## 2014-03-17 ENCOUNTER — Ambulatory Visit (INDEPENDENT_AMBULATORY_CARE_PROVIDER_SITE_OTHER): Payer: Medicare Other | Admitting: Emergency Medicine

## 2014-03-17 ENCOUNTER — Ambulatory Visit (INDEPENDENT_AMBULATORY_CARE_PROVIDER_SITE_OTHER): Payer: Medicare Other

## 2014-03-17 VITALS — BP 132/66 | HR 82 | Temp 97.9°F | Resp 16 | Ht 67.5 in | Wt 115.0 lb

## 2014-03-17 DIAGNOSIS — K5900 Constipation, unspecified: Secondary | ICD-10-CM

## 2014-03-17 LAB — POC HEMOCCULT BLD/STL (OFFICE/1-CARD/DIAGNOSTIC): Fecal Occult Blood, POC: NEGATIVE

## 2014-03-17 NOTE — Patient Instructions (Signed)
Take your stool softener twice a day. Take a dose of MiraLAX twice a day. Use a fleets enema twice a day. When constipation improves start on Citrucel one packet a day.

## 2014-03-17 NOTE — Progress Notes (Signed)
   This chart was scribed for Darlyne Russian, MD by Edison Simon, ED Scribe. This patient was seen in room 12 and the patient's care was started at 8:35 AM.   Subjective:    Patient ID: Latoya Wright, female    DOB: [birth year], 69 y.o.   MRN: [id number]  HPI  HPI Comments: Latoya Wright is a 69 y.o. female who presents to the [facility] for follow up for constipation. She states used Miralax, stool softener, and fiber supplement without remission. She states she has had "some little" bowel movements. She states her stool is not hard or dark. She does report one bowel movement after using Dulcolax pills. She states her abdomen sometimes feels bloated/distended. She denies any weight gain. She denies any other symptoms. She states she has not been drinking a lot of fluids. She states her last colonoscopy was a few years ago; 3 polyps were removed and it was otherwise normal. She notes she saw her gynecologist last week and had angiolipomas removed.  Review of Systems  Constitutional: Negative for unexpected weight change.  Gastrointestinal: Positive for constipation and abdominal distention.  All other systems reviewed and are negative.     Objective:   Physical Exam  Nursing note and vitals reviewed.   CONSTITUTIONAL: Well developed/well nourished HEAD: Normocephalic/atraumatic EYES: EOMI/PERRL ENMT: Mucous membranes moist NECK: supple no meningeal signs SPINE/BACK:entire spine nontender CV: S1/S2 noted, no murmurs/rubs/gallops noted LUNGS: Lungs are clear to auscultation bilaterally, no apparent distress ABDOMEN: soft, flat, nontender, no rebound or guarding, bowel sounds noted throughout abdomen, stool palpable in LLQ GU:no cva tenderness NEURO: Pt is awake/alert/appropriate, moves all extremitiesx4.  No facial droop.   EXTREMITIES: pulses normal/equal, full ROM SKIN: warm, color normal PSYCH: no abnormalities of mood noted, alert and oriented to situation [facility]  reading (PRIMARY) by  Dr. Everlene Farrier no evidence of bowel obstruction no free air there is a large stool burden. Results for orders placed or performed in visit on 03/17/14  POC Hemoccult Bld/Stl (1-Cd Office Dx)  Result Value Ref Range   Card #1 Date 03/17/2014    Fecal Occult Blood, POC Negative       Assessment & Plan:  Patient is having issues with constipation. She will do MiraLAX twice a day. She will continue her stool softener. She will use a Dulcolax suppository.I personally performed the services described in this documentation, which was scribed in my presence. The recorded information has been reviewed and is accurate.

## 2014-03-28 ENCOUNTER — Ambulatory Visit (INDEPENDENT_AMBULATORY_CARE_PROVIDER_SITE_OTHER): Payer: Medicare Other | Admitting: Family Medicine

## 2014-03-28 ENCOUNTER — Ambulatory Visit (INDEPENDENT_AMBULATORY_CARE_PROVIDER_SITE_OTHER): Payer: Medicare Other

## 2014-03-28 VITALS — BP 142/70 | HR 83 | Temp 98.2°F | Resp 16 | Ht 67.5 in | Wt 115.8 lb

## 2014-03-28 DIAGNOSIS — R05 Cough: Secondary | ICD-10-CM

## 2014-03-28 DIAGNOSIS — J040 Acute laryngitis: Secondary | ICD-10-CM

## 2014-03-28 DIAGNOSIS — Z8701 Personal history of pneumonia (recurrent): Secondary | ICD-10-CM

## 2014-03-28 DIAGNOSIS — R0989 Other specified symptoms and signs involving the circulatory and respiratory systems: Secondary | ICD-10-CM

## 2014-03-28 DIAGNOSIS — J189 Pneumonia, unspecified organism: Secondary | ICD-10-CM | POA: Diagnosis not present

## 2014-03-28 DIAGNOSIS — R059 Cough, unspecified: Secondary | ICD-10-CM

## 2014-03-28 DIAGNOSIS — R0781 Pleurodynia: Secondary | ICD-10-CM

## 2014-03-28 LAB — POCT CBC
Granulocyte percent: 69.7 %G (ref 37–80)
HCT, POC: 36.5 % — AB (ref 37.7–47.9)
Hemoglobin: 11.5 g/dL — AB (ref 12.2–16.2)
LYMPH, POC: 1.5 (ref 0.6–3.4)
MCH, POC: 28.5 pg (ref 27–31.2)
MCHC: 31.6 g/dL — AB (ref 31.8–35.4)
MCV: 90 fL (ref 80–97)
MID (cbc): 0.6 (ref 0–0.9)
MPV: 8.1 fL (ref 0–99.8)
PLATELET COUNT, POC: 235 10*3/uL (ref 142–424)
POC Granulocyte: 5 (ref 2–6.9)
POC LYMPH PERCENT: 21.5 %L (ref 10–50)
POC MID %: 8.8 %M (ref 0–12)
RBC: 4.06 M/uL (ref 4.04–5.48)
RDW, POC: 15.3 %
WBC: 7.2 10*3/uL (ref 4.6–10.2)

## 2014-03-28 MED ORDER — CLARITHROMYCIN 500 MG PO TABS: 500.0000 mg | ORAL_TABLET | Freq: Two times a day (BID) | ORAL | Status: DC

## 2014-03-28 MED ORDER — BENZONATATE 100 MG PO CAPS: 100.0000 mg | ORAL_CAPSULE | Freq: Three times a day (TID) | ORAL | Status: DC | PRN

## 2014-03-28 NOTE — Patient Instructions (Signed)
Drink plenty of fluids and get enough rest  Take the antibiotic one pill twice daily at breakfast and supper, clarithromycin 500 mg  Use the benzonatate cough pills one or 2 pills 3 times daily as needed for cough  Return at anytime if concern of more shortness of breath or fever or generally getting worse  Plan to return in about 7-10 days for recheck. Because of your recurrences of pneumonia think it is very important that we follow this out until it is completely healed. Come in sooner if needed.

## 2014-03-28 NOTE — Progress Notes (Signed)
Subjective 69 year old lady who is here with a history of about a 4 day episode of laryngitis developed into a cough. She's coughing some creamy phlegm. She hurts in her right chest wall. She has not been able to take her temperature since they are getting ready to move her thermometer was packed away. She is feels some hot flashes. She hurts in her right anterior chest wall underneath her right breast when she breathes deep or coughs or bends over. She does not smoke. Last fall she did have an episode of pneumonia in this area which was a little slow to resolve. They worked her up pretty extensively with CT scan etc. The pulmonologist did not think she had any bronchiectasis.  Objective: O2 sat good. Pleasant lady who appears healthy. Throat clear. TMs normal. Neck supple without nodes. Chest is clear to auscultation with the exception of the right lower lobe laterally just below the lower outer edge of the breast. Abdomen soft without masses or tenderness. Chest wall is nontender.  Assessment: Cough Chest rales History of pneumonia Chest wall pleuritic pain  Plan: Chest x-ray and CBC and decide treatment accordingly  [facility] reading (PRIMARY) by  Dr. Linna Darner Infiltrate medially in the right lower lung field, probably right middle lobe. Visible on lateral anteriorly. Definitely not present on a chest x-ray that was done with abdominal series a few weeks ago.Marland Kitchen Also has some increased prominence of the bronchial markings on the left.  Results for orders placed or performed in visit on 03/28/14  POCT CBC  Result Value Ref Range   WBC 7.2 4.6 - 10.2 K/uL   Lymph, poc 1.5 0.6 - 3.4   POC LYMPH PERCENT 21.5 10 - 50 %L   MID (cbc) 0.6 0 - 0.9   POC MID % 8.8 0 - 12 %M   POC Granulocyte 5.0 2 - 6.9   Granulocyte percent 69.7 37 - 80 %G   RBC 4.06 4.04 - 5.48 M/uL   Hemoglobin 11.5 (A) 12.2 - 16.2 g/dL   HCT, POC 36.5 (A) 37.7 - 47.9 %   MCV 90.0 80 - 97 fL   MCH, POC 28.5 27 - 31.2 pg   MCHC  31.6 (A) 31.8 - 35.4 g/dL   RDW, POC 15.3 %   Platelet Count, POC 235 142 - 424 K/uL   MPV 8.1 0 - 99.8 fL

## 2014-04-03 ENCOUNTER — Ambulatory Visit (INDEPENDENT_AMBULATORY_CARE_PROVIDER_SITE_OTHER): Payer: Medicare Other | Admitting: Emergency Medicine

## 2014-04-03 VITALS — BP 114/68 | HR 75 | Temp 98.2°F | Resp 17 | Ht 67.5 in | Wt 116.0 lb

## 2014-04-03 DIAGNOSIS — J189 Pneumonia, unspecified organism: Secondary | ICD-10-CM | POA: Diagnosis not present

## 2014-04-03 NOTE — Progress Notes (Signed)
   This chart was scribed for Darlyne Russian, MD by Edison Simon, ED Scribe. This patient was seen in room 12 and the patient's care was started at 8:29 AM.   Subjective:    Patient ID: Latoya Wright, female    DOB: [birth year], 69 y.o.   MRN: [id number]  HPI  HPI Comments: Latoya Wright is a 69 y.o. female who presents to the [facility] for follow up. She was seen 2 days ago by Dr. Linna Darner and started on Bactrim and Tessalon for pneumonia. Chest x-ray showed a new right middle lobe pneumonia. She had a CT of her chest in October 2015 consistent with pneumonia. She states she feels better now. She states she still coughs some but it is not productive. She denies SOB. She notes she moved into an apartment in August and has been ill often since then; she denies other major changes. She states she felt well up until last getting sick.  Review of Systems  Respiratory: Positive for cough. Negative for shortness of breath.   Cardiovascular: Negative for chest pain.       Objective:   Physical Exam  Nursing note and vitals reviewed.   CONSTITUTIONAL: Well developed/well nourished HEAD: Normocephalic/atraumatic EYES: EOMI/PERRL ENMT: Mucous membranes moist NECK: supple no meningeal signs SPINE/BACK:entire spine nontender CV: S1/S2 noted, no murmurs/rubs/gallops noted LUNGS: no apparent distress, few rhonchi on right ABDOMEN: soft, nontender, no rebound or guarding, bowel sounds noted throughout abdomen GU:no cva tenderness NEURO: Pt is awake/alert/appropriate, moves all extremitiesx4.  No facial droop.   EXTREMITIES: pulses normal/equal, full ROM SKIN: warm, color normal PSYCH: no abnormalities of mood noted, alert and oriented to situation     Assessment & Plan:  Patient tolerating Biaxin well. She does have some apparent scarring in the right middle lobe. Will recheck x-ray in 1 week. I suspect we should go ahead and refer her back to Dr. Curt Jews who saw her previously  with her abnormal CT.I personally performed the services described in this documentation, which was scribed in my presence. The recorded information has been reviewed and is accurate.

## 2014-04-10 ENCOUNTER — Telehealth: Payer: Self-pay

## 2014-04-10 ENCOUNTER — Ambulatory Visit (INDEPENDENT_AMBULATORY_CARE_PROVIDER_SITE_OTHER): Payer: Medicare Other

## 2014-04-10 ENCOUNTER — Ambulatory Visit (INDEPENDENT_AMBULATORY_CARE_PROVIDER_SITE_OTHER): Payer: Medicare Other | Admitting: Emergency Medicine

## 2014-04-10 VITALS — BP 138/58 | HR 81 | Temp 98.1°F | Resp 16 | Ht 67.5 in | Wt 116.0 lb

## 2014-04-10 DIAGNOSIS — J189 Pneumonia, unspecified organism: Secondary | ICD-10-CM | POA: Diagnosis not present

## 2014-04-10 DIAGNOSIS — R918 Other nonspecific abnormal finding of lung field: Secondary | ICD-10-CM | POA: Diagnosis not present

## 2014-04-10 NOTE — Progress Notes (Addendum)
Subjective:  This chart was scribed for Latoya Jordan, MD by Mercy Moore, Medial Scribe. This patient was seen in room 2 and the patient's care was started at 8:28 AM.    Patient ID: Latoya Wright, female    DOB: 1945-05-04, 69 y.o.   MRN: [id number] Chief Complaint  Patient presents with  . Follow-up  . Pneumonia    HPI HPI Comments: Latoya Wright is a 69 y.o. female who presents to the [facility] for follow evaluation after recent diagnosis of pnuemoia. Patient states that she feels well and specifically denies difficulty breathing or productive cough.  Patient with history of pneumonia reports that she is not scheduled to visit with her pulmonary specialist until one year.   Patient Active Problem List   Diagnosis Date Noted  . Thyroid nodule 12/12/2013  . 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 CAP (community acquired pneumonia) 11/09/2013  . Cough 11/09/2013   Past Medical History  Diagnosis Date  . Allergy   . Arthritis    No past surgical history on file. Allergies  Allergen Reactions  . Dayquil [Pseudoephedrine-Apap-Dm]     jittery   Prior to Admission medications   Medication Sig Start Date End Date Taking? Authorizing Provider  aspirin 81 MG tablet Take 81 mg by mouth daily.   Yes Historical Provider, MD  Biotin 1 MG CAPS Take by mouth.   Yes Historical Provider, MD  Calcium-Magnesium-Vitamin D (CALCIUM 500 PO) Take 3 tablets by mouth daily.   Yes Historical Provider, MD  Cholecalciferol 2000 UNITS CAPS Take 1 capsule by mouth daily.   Yes Historical Provider, MD  OVER THE COUNTER MEDICATION Take 1 tablet by mouth daily. kerotin  100 mg   Yes Historical Provider, MD   History   Social History  . Marital Status: Married    Spouse Name: N/A  . Number of Children: N/A  . Years of Education: N/A   Occupational History  . Not on file.   Social History Main Topics  . Smoking status: Never Smoker   . Smokeless tobacco: Never Used  . Alcohol Use:  No  . Drug Use: No  . Sexual Activity: Not on file   Other Topics Concern  . Not on file   Social History Narrative      Review of Systems  Constitutional: Negative for fever and chills.  Respiratory: Positive for cough.        Objective:   Physical Exam  CONSTITUTIONAL: Well developed/well nourished HEAD: Normocephalic/atraumatic EYES: EOMI/PERRL ENMT: Mucous membranes moist NECK: supple, no meningeal signs SPINE/BACK:entire spine nontender CV: S1/S2 noted, no murmurs/rubs/gallops noted LUNGS: Lungs are clear to auscultation bilaterally, no apparent distress; few rhonchi on right, but breath symmetrical and lungs otherwise clear ABDOMEN: soft, nontender, no rebound or guarding, bowel sounds noted throughout abdomen GU:no cva tenderness NEURO: Pt is awake/alert/appropriate, moves all extremitiesx4.  No facial droop.   EXTREMITIES: pulses normal/equal, full ROM SKIN: warm, color normal PSYCH: no abnormalities of mood noted, alert and oriented to situation  Filed Vitals:   04/10/14 0817  BP: 138/58  Pulse: 81  Temp: 98.1 F (36.7 C)  TempSrc: Oral  Resp: 16  Height: 5' 7.5" (1.715 m)  Weight: 116 lb (52.617 kg)  SpO2: 100%   [facility] reading (PRIMARY) by  Dr. Everlene Farrier clearing of the previously noted right middle lobe pneumonia. There is one prominent bronchial tubes on the right      Assessment & Plan:  Patient will recheck first of April. At that visit will need to be scheduled for ultrasound the thyroid and repeat CT of the chest..I personally performed the services described in this documentation, which was scribed in my presence. The recorded information has been reviewed and is accurate.

## 2014-04-10 NOTE — Telephone Encounter (Signed)
-----   Message from Juanito Doom, MD sent at 04/10/2014  3:07 PM EDT ----- Caryl Pina,  Can you schedule a f/u appointment for this patient?  Recent pneumonia.  Thanks Ruby Cola ----- Message -----    From: Darlyne Russian, MD    Sent: 04/10/2014   9:09 AM      To: Juanito Doom, MD  Patient had another episode of pneumonia which responded to Biaxin

## 2014-04-10 NOTE — Telephone Encounter (Signed)
lmtcb X1 to schedule rov with pt.

## 2014-04-10 NOTE — Telephone Encounter (Signed)
Latoya Wright returned call. Latoya Wright refused to make an appointment. Latoya Wright states she does not want to come in because dr Everlene Farrier is treating her. She rather wait until at least august

## 2014-04-10 NOTE — Telephone Encounter (Signed)
Will make Dr. Lake Bells aware

## 2014-04-11 ENCOUNTER — Telehealth: Payer: Self-pay

## 2014-04-11 NOTE — Telephone Encounter (Signed)
If she wants to wait until April that is fine but I do thank it would be good for her to follow-up with Dr. Curt Jews since she had another episode of pneumonia.

## 2014-04-11 NOTE — Telephone Encounter (Signed)
Left message for pt to call back  °

## 2014-04-11 NOTE — Telephone Encounter (Signed)
Spoke with pt, advised message from Dr. Daub. Pt understood. 

## 2014-04-11 NOTE — Telephone Encounter (Signed)
Patient wanted to let Dr Everlene Farrier know Dr. Lake Bells office contacted her in regards to follow up for another episode of pneumonia which responded to Biaxin. Patient declined scheduling at this time. She wants to wait until at least April and wants to know if this is ok with Dr Everlene Farrier. Patients call back number is [phone]

## 2014-04-12 NOTE — Telephone Encounter (Signed)
I am not sure why she does not want to come in. Maybe she is afraid. I will see her back next week and hopefully convince her to follow-up with you. Thanks again for all your help.

## 2014-04-12 NOTE — Telephone Encounter (Signed)
OK, just double check because Daub sent me a lot of messages requesting I see her.

## 2014-04-16 ENCOUNTER — Telehealth: Payer: Self-pay | Admitting: Pulmonary Disease

## 2014-04-16 NOTE — Telephone Encounter (Signed)
Called and spoke to pt. Pt questioned if the f/u with BQ was for another reason besides the pna. Informed pt that the phone note from 3/16- pna is the only reason for the appt. Pt verbalized understanding and denied any further questions or concerns at this time.

## 2014-05-14 ENCOUNTER — Ambulatory Visit (INDEPENDENT_AMBULATORY_CARE_PROVIDER_SITE_OTHER): Payer: Medicare Other | Admitting: Emergency Medicine

## 2014-05-14 ENCOUNTER — Encounter: Payer: Self-pay | Admitting: Emergency Medicine

## 2014-05-14 VITALS — BP 120/58 | HR 54 | Temp 98.1°F | Resp 16 | Ht 67.5 in | Wt 113.2 lb

## 2014-05-14 DIAGNOSIS — D1803 Hemangioma of intra-abdominal structures: Secondary | ICD-10-CM

## 2014-05-14 DIAGNOSIS — E041 Nontoxic single thyroid nodule: Secondary | ICD-10-CM

## 2014-05-14 DIAGNOSIS — E049 Nontoxic goiter, unspecified: Secondary | ICD-10-CM | POA: Diagnosis not present

## 2014-05-14 DIAGNOSIS — E559 Vitamin D deficiency, unspecified: Secondary | ICD-10-CM | POA: Diagnosis not present

## 2014-05-14 DIAGNOSIS — E079 Disorder of thyroid, unspecified: Secondary | ICD-10-CM

## 2014-05-14 DIAGNOSIS — J189 Pneumonia, unspecified organism: Secondary | ICD-10-CM | POA: Diagnosis not present

## 2014-05-14 LAB — CBC WITH DIFFERENTIAL/PLATELET
Basophils Absolute: 0 10*3/uL (ref 0.0–0.1)
Basophils Relative: 0 % (ref 0–1)
Eosinophils Absolute: 0.1 10*3/uL (ref 0.0–0.7)
Eosinophils Relative: 1 % (ref 0–5)
HCT: 36.6 % (ref 36.0–46.0)
Hemoglobin: 12.2 g/dL (ref 12.0–15.0)
LYMPHS PCT: 28 % (ref 12–46)
Lymphs Abs: 1.8 10*3/uL (ref 0.7–4.0)
MCH: 29.4 pg (ref 26.0–34.0)
MCHC: 33.3 g/dL (ref 30.0–36.0)
MCV: 88.2 fL (ref 78.0–100.0)
MONO ABS: 0.4 10*3/uL (ref 0.1–1.0)
MPV: 10.3 fL (ref 8.6–12.4)
Monocytes Relative: 7 % (ref 3–12)
NEUTROS ABS: 4 10*3/uL (ref 1.7–7.7)
Neutrophils Relative %: 64 % (ref 43–77)
Platelets: 304 10*3/uL (ref 150–400)
RBC: 4.15 MIL/uL (ref 3.87–5.11)
RDW: 14.3 % (ref 11.5–15.5)
WBC: 6.3 10*3/uL (ref 4.0–10.5)

## 2014-05-14 LAB — TSH: TSH: 0.89 u[IU]/mL (ref 0.350–4.500)

## 2014-05-14 LAB — COMPLETE METABOLIC PANEL WITH GFR
ALK PHOS: 61 U/L (ref 39–117)
ALT: 15 U/L (ref 0–35)
AST: 19 U/L (ref 0–37)
Albumin: 4.3 g/dL (ref 3.5–5.2)
BILIRUBIN TOTAL: 0.7 mg/dL (ref 0.2–1.2)
BUN: 20 mg/dL (ref 6–23)
CO2: 25 meq/L (ref 19–32)
CREATININE: 0.73 mg/dL (ref 0.50–1.10)
Calcium: 9.2 mg/dL (ref 8.4–10.5)
Chloride: 105 mEq/L (ref 96–112)
GFR, Est Non African American: 85 mL/min
Glucose, Bld: 83 mg/dL (ref 70–99)
Potassium: 5.3 mEq/L (ref 3.5–5.3)
Sodium: 141 mEq/L (ref 135–145)
Total Protein: 6.8 g/dL (ref 6.0–8.3)

## 2014-05-14 LAB — LIPID PANEL
CHOL/HDL RATIO: 2.3 ratio
Cholesterol: 205 mg/dL — ABNORMAL HIGH (ref 0–200)
HDL: 90 mg/dL (ref >=46)
LDL CALC: 105 mg/dL — AB (ref 0–99)
Triglycerides: 52 mg/dL (ref <150)
VLDL: 10 mg/dL (ref 0–40)

## 2014-05-14 LAB — POCT URINALYSIS DIPSTICK
Glucose, UA: NEGATIVE
Leukocytes, UA: NEGATIVE
Nitrite, UA: NEGATIVE
PH UA: 5
Protein, UA: NEGATIVE
RBC UA: NEGATIVE
SPEC GRAV UA: 1.025
Urobilinogen, UA: 0.2

## 2014-05-14 LAB — T4, FREE: FREE T4: 1.03 ng/dL (ref 0.80–1.80)

## 2014-05-14 NOTE — Progress Notes (Signed)
Subjective:  This chart was scribed for Darlyne Russian, MD by Tamsen Roers, at [facility].  This patient was seen in room 23 and the patient's care was started at 2:14 PM.    Patient ID: Latoya Wright, female    DOB: 06-19-1945, 69 y.o.   MRN: [id number]  HPI  HPI Comments: Latoya Wright is a 69 y.o. female who presents to [facility] for an annual exam.  She is complaining of itching on her neck and abdomen which she thinks may be poison ivy.   Person states that [place] very congested and was doing weed eating 7 days ago.  She does not take any medication for allergies.  Patient is up to date with her Prevnar vaccination, shingles vaccination, mammogram and colonoscopy. She does not want a pap smear today because she will be getting one at her GYN visit. (Dr. Stann Mainland). Patient sees her eye doctor once a year. 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 Her MRI was done in November.  She is moving to [place] this summer.  She has not eaten since 5:00 AM.      Patient Active Problem List   Diagnosis Date Noted  . Pulmonary nodules 04/10/2014  . Thyroid nodule 12/12/2013  . 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 CAP (community acquired pneumonia) 11/09/2013  . Cough 11/09/2013   Past Medical History  Diagnosis Date  . Allergy   . Arthritis    No past surgical history on file. Allergies  Allergen Reactions  . Dayquil [Pseudoephedrine-Apap-Dm]     jittery   Prior to Admission medications   Medication Sig Start Date End Date Taking? Authorizing Provider  aspirin 81 MG tablet Take 81 mg by mouth daily.   Yes Historical Provider, MD  Biotin 1 MG CAPS Take by mouth.   Yes Historical Provider, MD  Calcium-Magnesium-Vitamin D (CALCIUM 500 PO) Take 3 tablets by mouth daily.   Yes Historical Provider, MD  Cholecalciferol 2000 UNITS  CAPS Take 1 capsule by mouth daily.   Yes Historical Provider, MD  OVER THE COUNTER MEDICATION Take 1 tablet by mouth daily. kerotin  100 mg   Yes Historical Provider, MD  OVER THE COUNTER MEDICATION 2 (two) times a week.   Yes Historical Provider, MD   History   Social History  . Marital Status: Married    Spouse Name: N/A  . Number of Children: N/A  . Years of Education: N/A   Occupational History  . Not on file.   Social History Main Topics  . Smoking status: Never Smoker   . Smokeless tobacco: Never Used  . Alcohol Use: No  . Drug Use: No  . Sexual Activity: Not on file   Other Topics Concern  . Not on file   Social History Narrative     Review of Systems  Constitutional: Negative for fever and chills.  HENT: Positive for congestion and rhinorrhea. Negative for ear discharge, ear pain, facial swelling, mouth sores, nosebleeds and sore throat.   Respiratory: Positive for cough. Negative for choking and chest tightness.   Cardiovascular: Negative for chest pain and leg swelling.  Skin: Positive for rash. Negative for color change and wound.  Neurological: Negative for syncope.       Objective:   Physical Exam  Skin:  There  are raised firm red nodular areas on the nape of the neck and left side of the neck consistent with contact dermatitis.   CONSTITUTIONAL: Well developed/well nourished, She has a raspy voice  HEAD: Normocephalic/atraumatic EYES: EOMI/PERRL ENMT: Mucous membranes moist NECK: supple no meningeal signs SPINE/BACK:entire spine nontender CV: S1/S2 noted, no murmurs/rubs/gallops noted LUNGS: Lungs are clear to auscultation bilaterally, no apparent distress, Breath sounds symmetrical no wheezes or rales.  ABDOMEN: soft, nontender, no rebound or guarding, bowel sounds noted throughout abdomen GU:no cva tenderness NEURO: Pt is awake/alert/appropriate, moves all extremitiesx4.  No facial droop.   EXTREMITIES: pulses normal/equal, full ROM SKIN: warm,  color normal PSYCH: no abnormalities of mood noted, alert and oriented to situation Breast and pelvic exam deferred.   Filed Vitals:   05/14/14 1333  BP: 120/58  Pulse: 54  Temp: 98.1 F (36.7 C)  TempSrc: Oral  Resp: 16  Height: 5' 7.5" (1.715 m)  Weight: 113 lb 3.2 oz (51.347 kg)  SpO2: 100%         Assessment & Plan:  Patient will return to see me in September. Her routine labs were done today. She has had both pneumococcal vaccines. She does have thyroid nodules a hemangioma on the liver and pulmonary nodules all of which will need to be followed up on. She has had the shingles vaccine. Routine labs were done today. I advised her to take Zyrtec and use Flonase for an used steroid cream for her recent allergies and contact dermatitis.

## 2014-05-14 NOTE — Patient Instructions (Addendum)
Take Zyrtec 10 mg 1 a day. Use Flonase 2 puffs each nares once a day. Apply steroid cream tear contact dermatitis twice a day. Please see me in September so we can arrange follow-up imaging studies of your thyroid, chest, and liver.     Health Maintenance Adopting a healthy lifestyle and getting preventive care can go a long way to promote health and wellness. Talk with your health care provider about what schedule of regular examinations is right for you. This is a good chance for you to check in with your provider about disease prevention and staying healthy. In between checkups, there are plenty of things you can do on your own. Experts have done a lot of research about which lifestyle changes and preventive measures are most likely to keep you healthy. Ask your health care provider for more information. WEIGHT AND DIET  Eat a healthy diet  Be sure to include plenty of vegetables, fruits, low-fat dairy products, and lean protein.  Do not eat a lot of foods high in solid fats, added sugars, or salt.  Get regular exercise. This is one of the most important things you can do for your health.  Most adults should exercise for at least 150 minutes each week. The exercise should increase your heart rate and make you sweat (moderate-intensity exercise).  Most adults should also do strengthening exercises at least twice a week. This is in addition to the moderate-intensity exercise.  Maintain a healthy weight  Body mass index (BMI) is a measurement that can be used to identify possible weight problems. It estimates body fat based on height and weight. Your health care provider can help determine your BMI and help you achieve or maintain a healthy weight.  For females 49 years of age and older:   A BMI below 18.5 is considered underweight.  A BMI of 18.5 to 24.9 is normal.  A BMI of 25 to 29.9 is considered overweight.  A BMI of 30 and above is considered obese.  Watch levels of  cholesterol and blood lipids  You should start having your blood tested for lipids and cholesterol at 69 years of age, then have this test every 5 years.  You may need to have your cholesterol levels checked more often if:  Your lipid or cholesterol levels are high.  You are older than 69 years of age.  You are at high risk for heart disease.  CANCER SCREENING   Lung Cancer  Lung cancer screening is recommended for adults 36-77 years old who are at high risk for lung cancer because of a history of smoking.  A yearly low-dose CT scan of the lungs is recommended for people who:  Currently smoke.  Have quit within the past 15 years.  Have at least a 30-pack-year history of smoking. A pack year is smoking an average of one pack of cigarettes a day for 1 year.  Yearly screening should continue until it has been 15 years since you quit.  Yearly screening should stop if you develop a health problem that would prevent you from having lung cancer treatment.  Breast Cancer  Practice breast self-awareness. This means understanding how your breasts normally appear and feel.  It also means doing regular breast self-exams. Let your health care provider know about any changes, no matter how small.  If you are in your 20s or 30s, you should have a clinical breast exam (CBE) by a health care provider every 1-3 years as part of a  regular health exam.  If you are 40 or older, have a CBE every year. Also consider having a breast X-ray (mammogram) every year.  If you have a family history of breast cancer, talk to your health care provider about genetic screening.  If you are at high risk for breast cancer, talk to your health care provider about having an MRI and a mammogram every year.  Breast cancer gene (BRCA) assessment is recommended for women who have family members with BRCA-related cancers. BRCA-related cancers include:  Breast.  Ovarian.  Tubal.  Peritoneal  cancers.  Results of the assessment will determine the need for genetic counseling and BRCA1 and BRCA2 testing. Cervical Cancer Routine pelvic examinations to screen for cervical cancer are no longer recommended for nonpregnant women who are considered low risk for cancer of the pelvic organs (ovaries, uterus, and vagina) and who do not have symptoms. A pelvic examination may be necessary if you have symptoms including those associated with pelvic infections. Ask your health care provider if a screening pelvic exam is right for you.   The Pap test is the screening test for cervical cancer for women who are considered at risk.  If you had a hysterectomy for a problem that was not cancer or a condition that could lead to cancer, then you no longer need Pap tests.  If you are older than 65 years, and you have had normal Pap tests for the past 10 years, you no longer need to have Pap tests.  If you have had past treatment for cervical cancer or a condition that could lead to cancer, you need Pap tests and screening for cancer for at least 20 years after your treatment.  If you no longer get a Pap test, assess your risk factors if they change (such as having a new sexual partner). This can affect whether you should start being screened again.  Some women have medical problems that increase their chance of getting cervical cancer. If this is the case for you, your health care provider may recommend more frequent screening and Pap tests.  The human papillomavirus (HPV) test is another test that may be used for cervical cancer screening. The HPV test looks for the virus that can cause cell changes in the cervix. The cells collected during the Pap test can be tested for HPV.  The HPV test can be used to screen women 49 years of age and older. Getting tested for HPV can extend the interval between normal Pap tests from three to five years.  An HPV test also should be used to screen women of any age who  have unclear Pap test results.  After 69 years of age, women should have HPV testing as often as Pap tests.  Colorectal Cancer  This type of cancer can be detected and often prevented.  Routine colorectal cancer screening usually begins at 69 years of age and continues through 69 years of age.  Your health care provider may recommend screening at an earlier age if you have risk factors for colon cancer.  Your health care provider may also recommend using home test kits to check for hidden blood in the stool.  A small camera at the end of a tube can be used to examine your colon directly (sigmoidoscopy or colonoscopy). This is done to check for the earliest forms of colorectal cancer.  Routine screening usually begins at age 66.  Direct examination of the colon should be repeated every 5-10 years through 69  years of age. However, you may need to be screened more often if early forms of precancerous polyps or small growths are found. Skin Cancer  Check your skin from head to toe regularly.  Tell your health care provider about any new moles or changes in moles, especially if there is a change in a mole's shape or color.  Also tell your health care provider if you have a mole that is larger than the size of a pencil eraser.  Always use sunscreen. Apply sunscreen liberally and repeatedly throughout the day.  Protect yourself by wearing long sleeves, pants, a wide-brimmed hat, and sunglasses whenever you are outside. HEART DISEASE, DIABETES, AND HIGH BLOOD PRESSURE   Have your blood pressure checked at least every 1-2 years. High blood pressure causes heart disease and increases the risk of stroke.  If you are between 70 years and 39 years old, ask your health care provider if you should take aspirin to prevent strokes.  Have regular diabetes screenings. This involves taking a blood sample to check your fasting blood sugar level.  If you are at a normal weight and have a low risk for  diabetes, have this test once every three years after 69 years of age.  If you are overweight and have a high risk for diabetes, consider being tested at a younger age or more often. PREVENTING INFECTION  Hepatitis B  If you have a higher risk for hepatitis B, you should be screened for this virus. You are considered at high risk for hepatitis B if:  You were born in a country where hepatitis B is common. Ask your health care provider which countries are considered high risk.  Your parents were born in a high-risk country, and you have not been immunized against hepatitis B (hepatitis B vaccine).  You have HIV or AIDS.  You use needles to inject street drugs.  You live with someone who has hepatitis B.  You have had sex with someone who has hepatitis B.  You get hemodialysis treatment.  You take certain medicines for conditions, including cancer, organ transplantation, and autoimmune conditions. Hepatitis C  Blood testing is recommended for:  Everyone born from [birth year] through 1965.  Anyone with known risk factors for hepatitis C. Sexually transmitted infections (STIs)  You should be screened for sexually transmitted infections (STIs) including gonorrhea and chlamydia if:  You are sexually active and are younger than 69 years of age.  You are older than 69 years of age and your health care provider tells you that you are at risk for this type of infection.  Your sexual activity has changed since you were last screened and you are at an increased risk for chlamydia or gonorrhea. Ask your health care provider if you are at risk.  If you do not have HIV, but are at risk, it may be recommended that you take a prescription medicine daily to prevent HIV infection. This is called pre-exposure prophylaxis (PrEP). You are considered at risk if:  You are sexually active and do not regularly use condoms or know the HIV status of your partner(s).  You take drugs by injection.  You are  sexually active with a partner who has HIV. Talk with your health care provider about whether you are at high risk of being infected with HIV. If you choose to begin PrEP, you should first be tested for HIV. You should then be tested every 3 months for as long as you are taking PrEP.  PREGNANCY   If you are premenopausal and you may become pregnant, ask your health care provider about preconception counseling.  If you may become pregnant, take 400 to 800 micrograms (mcg) of folic acid every day.  If you want to prevent pregnancy, talk to your health care provider about birth control (contraception). OSTEOPOROSIS AND MENOPAUSE   Osteoporosis is a disease in which the bones lose minerals and strength with aging. This can result in serious bone fractures. Your risk for osteoporosis can be identified using a bone density scan.  If you are 13 years of age or older, or if you are at risk for osteoporosis and fractures, ask your health care provider if you should be screened.  Ask your health care provider whether you should take a calcium or vitamin D supplement to lower your risk for osteoporosis.  Menopause may have certain physical symptoms and risks.  Hormone replacement therapy may reduce some of these symptoms and risks. Talk to your health care provider about whether hormone replacement therapy is right for you.  HOME CARE INSTRUCTIONS   Schedule regular health, dental, and eye exams.  Stay current with your immunizations.   Do not use any tobacco products including cigarettes, chewing tobacco, or electronic cigarettes.  If you are pregnant, do not drink alcohol.  If you are breastfeeding, limit how much and how often you drink alcohol.  Limit alcohol intake to no more than 1 drink per day for nonpregnant women. One drink equals 12 ounces of beer, 5 ounces of wine, or 1 ounces of hard liquor.  Do not use street drugs.  Do not share needles.  Ask your health care provider for  help if you need support or information about quitting drugs.  Tell your health care provider if you often feel depressed.  Tell your health care provider if you have ever been abused or do not feel safe at home. Document Released: 07/27/2010 Document Revised: 05/28/2013 Document Reviewed: 12/13/2012 [facility] Patient Information 2015 [place], [place]. This information is not intended to replace advice given to you by your health care provider. Make sure you discuss any questions you have with your health care provider.  and abdomen.

## 2014-06-21 ENCOUNTER — Encounter: Payer: Self-pay | Admitting: *Deleted

## 2014-08-09 ENCOUNTER — Encounter: Payer: Self-pay | Admitting: *Deleted

## 2014-10-15 ENCOUNTER — Ambulatory Visit: Admission: RE | Admit: 2014-10-15 | Payer: Self-pay | Source: Ambulatory Visit

## 2014-10-15 ENCOUNTER — Telehealth: Payer: Self-pay | Admitting: *Deleted

## 2014-10-15 ENCOUNTER — Encounter: Payer: Self-pay | Admitting: Emergency Medicine

## 2014-10-15 ENCOUNTER — Ambulatory Visit (INDEPENDENT_AMBULATORY_CARE_PROVIDER_SITE_OTHER): Payer: Medicare Other | Admitting: Emergency Medicine

## 2014-10-15 ENCOUNTER — Ambulatory Visit
Admission: RE | Admit: 2014-10-15 | Discharge: 2014-10-15 | Disposition: A | Discharge status: Home / Self Care | Payer: Medicare Other | Source: Ambulatory Visit | Attending: Emergency Medicine | Admitting: Emergency Medicine

## 2014-10-15 ENCOUNTER — Ambulatory Visit (INDEPENDENT_AMBULATORY_CARE_PROVIDER_SITE_OTHER): Payer: Medicare Other

## 2014-10-15 VITALS — BP 150/76 | HR 75 | Temp 98.1°F | Resp 16 | Ht 68.0 in | Wt 109.6 lb

## 2014-10-15 DIAGNOSIS — R918 Other nonspecific abnormal finding of lung field: Secondary | ICD-10-CM

## 2014-10-15 DIAGNOSIS — M25511 Pain in right shoulder: Secondary | ICD-10-CM

## 2014-10-15 DIAGNOSIS — S0990XA Unspecified injury of head, initial encounter: Secondary | ICD-10-CM

## 2014-10-15 DIAGNOSIS — D1803 Hemangioma of intra-abdominal structures: Secondary | ICD-10-CM

## 2014-10-15 DIAGNOSIS — E079 Disorder of thyroid, unspecified: Secondary | ICD-10-CM | POA: Diagnosis not present

## 2014-10-15 DIAGNOSIS — Z8249 Family history of ischemic heart disease and other diseases of the circulatory system: Secondary | ICD-10-CM | POA: Diagnosis not present

## 2014-10-15 LAB — THYROID PANEL WITH TSH
FREE THYROXINE INDEX: 1.7 (ref 1.4–3.8)
T3 Uptake: 30 % (ref 22–35)
T4 TOTAL: 5.8 ug/dL (ref 4.5–12.0)
TSH: 1.425 u[IU]/mL (ref 0.350–4.500)

## 2014-10-15 NOTE — Telephone Encounter (Signed)
Dr. [facility] called stating the call report was in for this pt

## 2014-10-15 NOTE — Progress Notes (Signed)
Patient ID: Latoya Wright, female   DOB: 24-Apr-1945, 69 y.o.   MRN: [id number]     This chart was scribed for Arlyss Queen, MD by Zola Button, Medical Scribe. This patient was seen in room 21 and the patient's care was started at 8:17 AM.   Chief Complaint:  Chief Complaint  Patient presents with  . Follow-up  . Thyroid    HPI: Latoya Wright is a 69 y.o. female with a history of thyroid nodule who reports to [facility] today for a follow-up. Patient has been doing well overall. She recently moved to [facility]; she was against the move. She has been mowing yards twice a week.  Patient did have a fall off of a stepladder about 3 weeks ago. She has had some right shoulder numbness. She has not had any XR imaging.  Patient states since her fall she has also had some dizziness. She states that when she goes from lying to standing or moves her head quickly she feels unsteady. She has not had any vomiting or headache or extremity weakness associated with this.  Patient is UTD on colonoscopy and mammogram. She will see her endocrinologist in December. She declines the influenza vaccine.  Her brother was recently diagnosed with A Fib right before he was about to have a knee replacement surgery. She is concerned she may also have A Fib.  Past Medical History  Diagnosis Date  . Allergy   . Arthritis    No past surgical history on file. Social History   Social History  . Marital Status: Married    Spouse Name: N/A  . Number of Children: N/A  . Years of Education: N/A   Social History Main Topics  . Smoking status: Never Smoker   . Smokeless tobacco: Never Used  . Alcohol Use: No  . Drug Use: No  . Sexual Activity: Not Asked   Other Topics Concern  . None   Social History Narrative   Family History  Problem Relation Age of Onset  . Heart disease Father   . Heart disease Maternal Grandmother   . Heart disease Maternal Grandfather   . Heart disease Paternal Grandmother   . Heart disease  Paternal Grandfather   . Rheum arthritis Sister   . Rheum arthritis Brother    Allergies  Allergen Reactions  . Dayquil [Pseudoephedrine-Apap-Dm]     jittery   Prior to Admission medications   Medication Sig Start Date End Date Taking? Authorizing Provider  aspirin 81 MG tablet Take 81 mg by mouth daily.   Yes Historical Provider, MD  Biotin 1 MG CAPS Take by mouth.   Yes Historical Provider, MD  Calcium-Magnesium-Vitamin D (CALCIUM 500 PO) Take 3 tablets by mouth daily.   Yes Historical Provider, MD  Cholecalciferol 2000 UNITS CAPS Take 1 capsule by mouth daily.   Yes Historical Provider, MD     ROS: The patient denies fevers, chills, night sweats, unintentional weight loss, chest pain, palpitations, wheezing, dyspnea on exertion, nausea, vomiting, abdominal pain, dysuria, hematuria, melena, numbness, weakness, or tingling.   All other systems have been reviewed and were otherwise negative with the exception of those mentioned in the HPI and as above.    PHYSICAL EXAM: Filed Vitals:   10/15/14 0808  BP: 140/70  Pulse: 65  Temp: 98.1 F (36.7 C)  Resp: 16   Body mass index is 16.67 kg/(m^2).   General: Alert, no acute distress HEENT:  Normocephalic, atraumatic, oropharynx patent. Eye: [facility]  Cardiovascular:  Regular rate and rhythm, no rubs murmurs or gallops.  No Carotid bruits, radial pulse intact. No pedal edema.  Respiratory: Clear to auscultation bilaterally.  No wheezes, rales, or rhonchi.  No cyanosis, no use of accessory musculature Abdominal: No organomegaly, abdomen is soft and non-tender, positive bowel sounds.  No masses. Musculoskeletal: Gait intact. No edema, tenderness Skin: No rashes. Neurologic: Facial musculature symmetric. Psychiatric: Patient acts appropriately throughout our interaction. Neck: There is a right-sided thyroid nodule.     LABS:    EKG/XRAY:    Primary read interpreted by Dr. Everlene Farrier at [facility]. No fracture seen good  alignment  ASSESSMENT/PLAN: 1. Thyroid disorder  follow-up ultrasound ordered. She has an appointment with ENT in December. - EKG 12-Lead - Thyroid Panel With TSH - US Soft Tissue Head/Neck; Future  2. Hemangioma of liver  - US Abdomen Limited RUQ; Future  3. Pulmonary nodules  referral placed for Dr. Lake Bells to follow-up her abnormal chest CT - Ambulatory referral to Pulmonology  4. Pain in joint, shoulder region, right  no fracture seen - DG Shoulder Right; Future  5. FH: atrial fibrillation  EKG is normal no evidence of atrial fib - EKG 12-Lead  6. Head injury, initial encounter  neurological exam is normal we'll proceed with CT head. - CT Head Wo Contrast; Future   I personally performed the services described in this documentation, which was scribed in my presence. The recorded information has been reviewed and is accurate.  Arlyss Queen, MD  [facility]  10/15/2014 9:41 AM    Gross sideeffects, risk and benefits, and alternatives of medications d/w patient. Patient is aware that all medications have potential sideeffects and we are unable to predict every sideeffect or drug-drug interaction that may occur.  Arlyss Queen MD 10/15/2014 8:30 AM

## 2014-10-15 NOTE — Patient Instructions (Signed)
Scheduled for CT head appt. today at 4:10 pm, at [facility], located at [address]

## 2014-10-16 NOTE — Telephone Encounter (Signed)
I called patient and gave her results of a normal scan.

## 2014-10-29 ENCOUNTER — Encounter: Payer: Self-pay | Admitting: Emergency Medicine

## 2014-12-09 ENCOUNTER — Ambulatory Visit: Payer: Medicare Other | Admitting: Pulmonary Disease

## 2015-04-15 ENCOUNTER — Ambulatory Visit: Payer: Medicare Other | Admitting: Emergency Medicine

## 2015-04-15 ENCOUNTER — Telehealth: Payer: Self-pay | Admitting: Family Medicine

## 2015-04-15 NOTE — Telephone Encounter (Signed)
Patient declined flu shot.  She has moved to [place] and will no longer be seeing [place] for primary care.

## 2015-04-17 ENCOUNTER — Ambulatory Visit: Payer: Medicare Other | Admitting: Emergency Medicine

## 2015-04-22 ENCOUNTER — Ambulatory Visit: Payer: Medicare Other | Admitting: Emergency Medicine

## 2015-04-23 ENCOUNTER — Ambulatory Visit: Payer: Medicare Other | Admitting: Pulmonary Disease

## 2015-08-02 IMAGING — US US ABDOMEN COMPLETE
1 series · 13 of 25 positions shown · non-contrast
Comparison: CT scan of the chest [DATE]/fifth

CLINICAL DATA: Possible liver hemangioma CT scan of the chest
10/31/2013

EXAM:
ULTRASOUND ABDOMEN COMPLETE

[Series 1: us abdomen complete · 0.19mm/px · 13 of 110 slices shown]
[im 1/110]
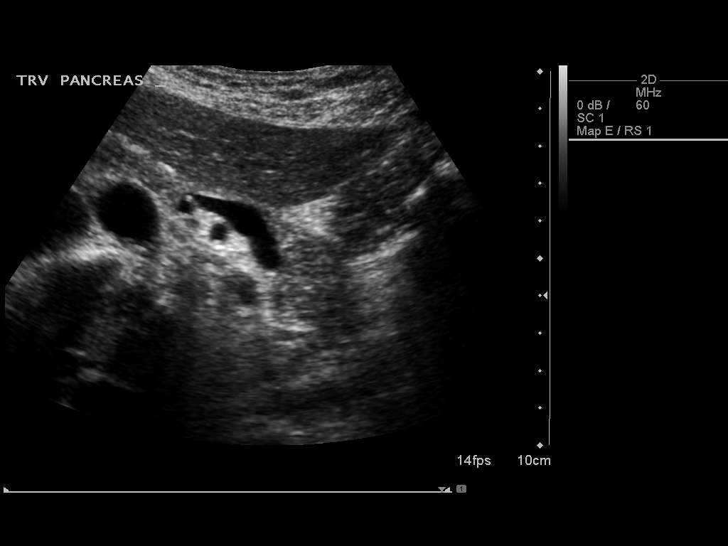
[im 10/110]
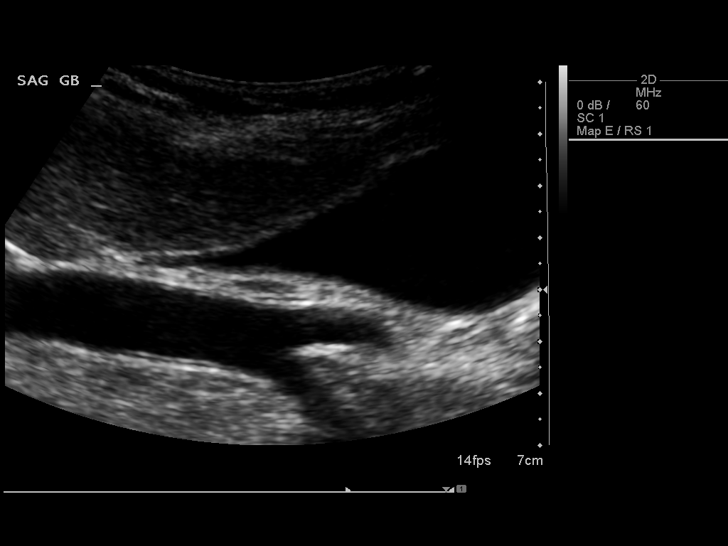
[im 19/110]
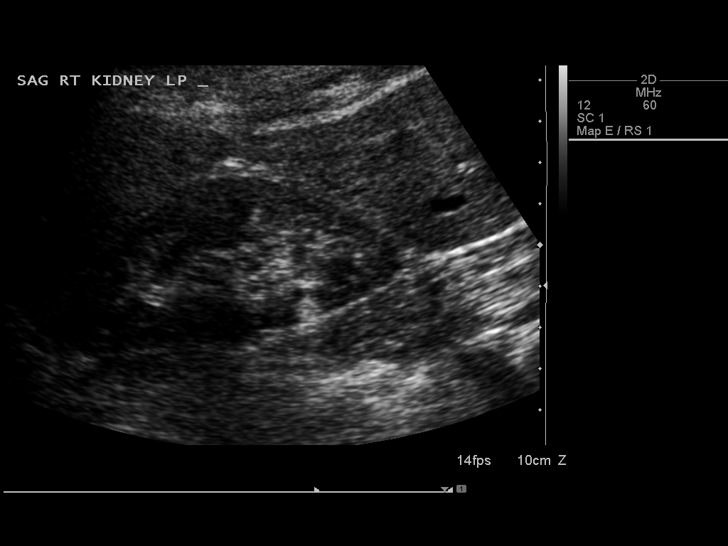
[im 28/110]
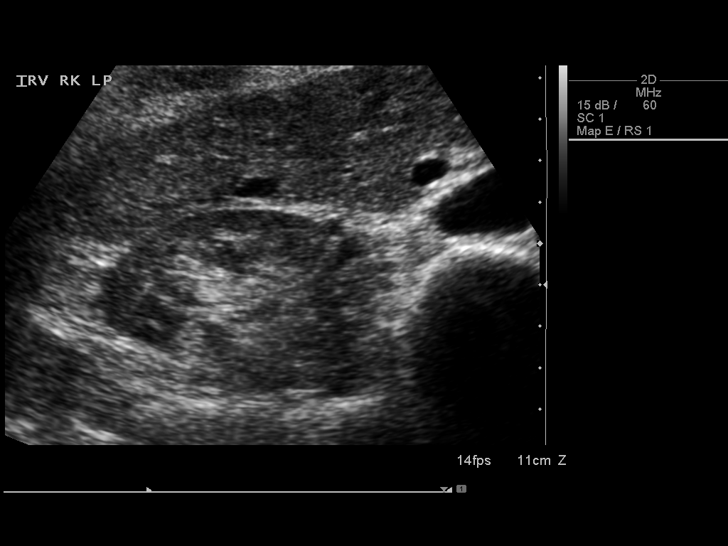
[im 37/110]
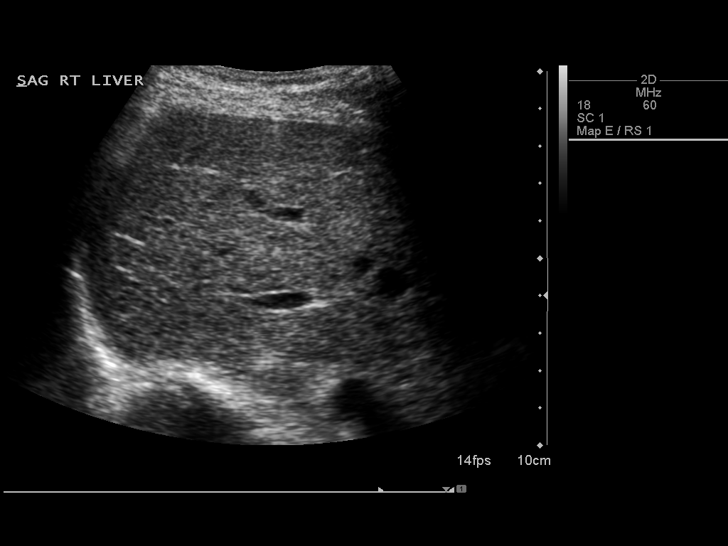
[im 46/110]
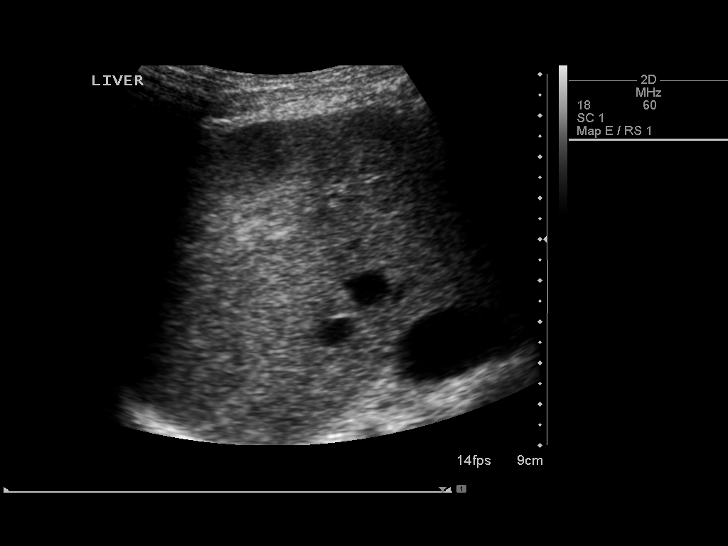
[im 55/110]
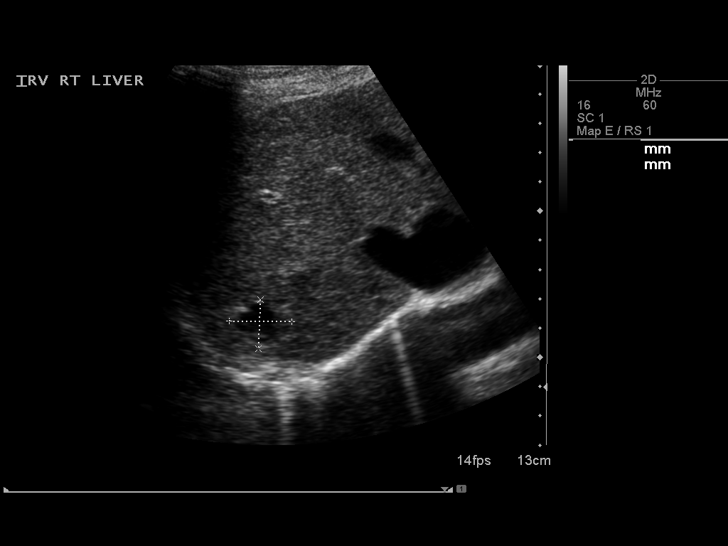
[im 64/110]
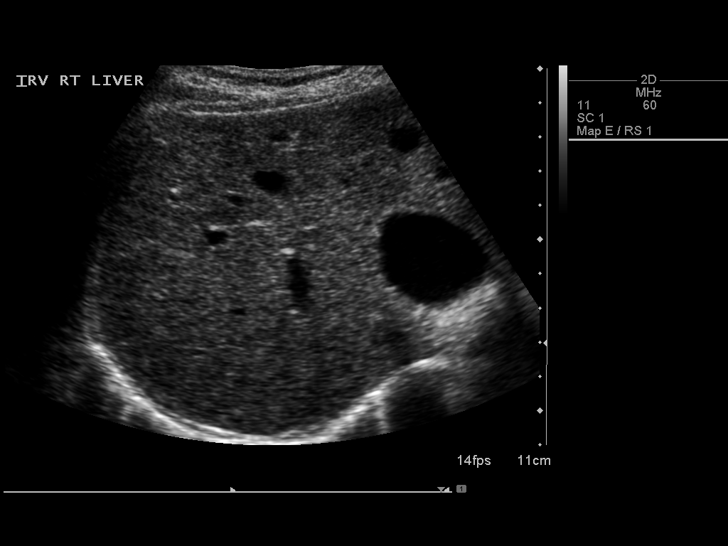
[im 73/110]
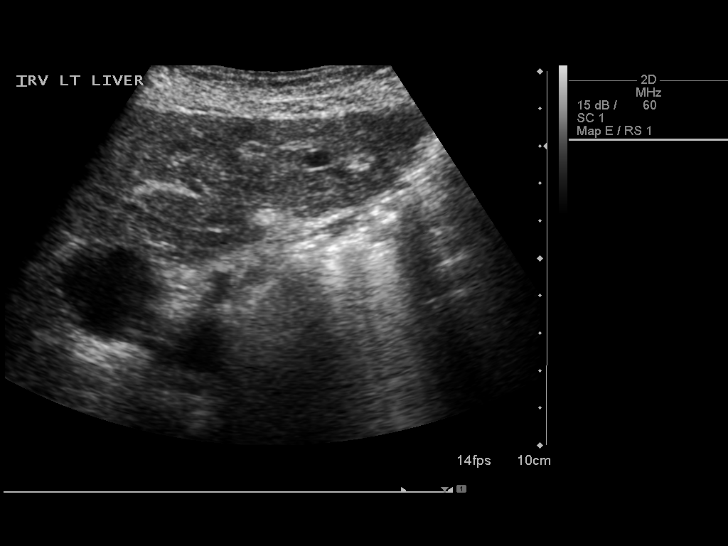
[im 82/110]
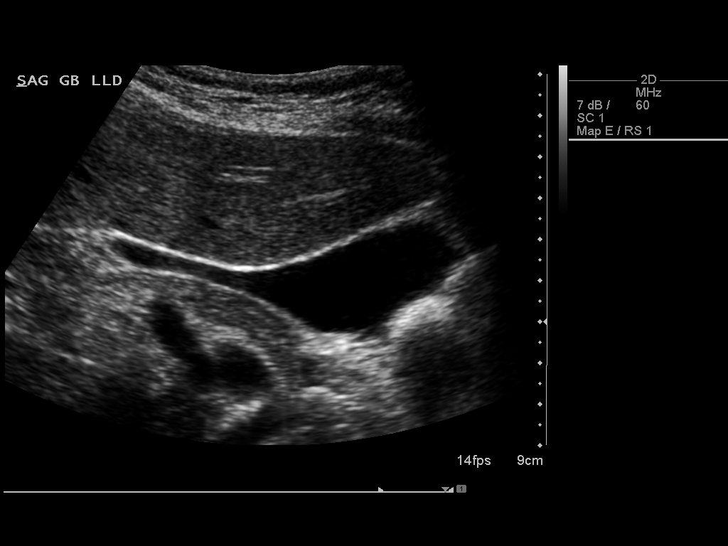
[im 91/110]
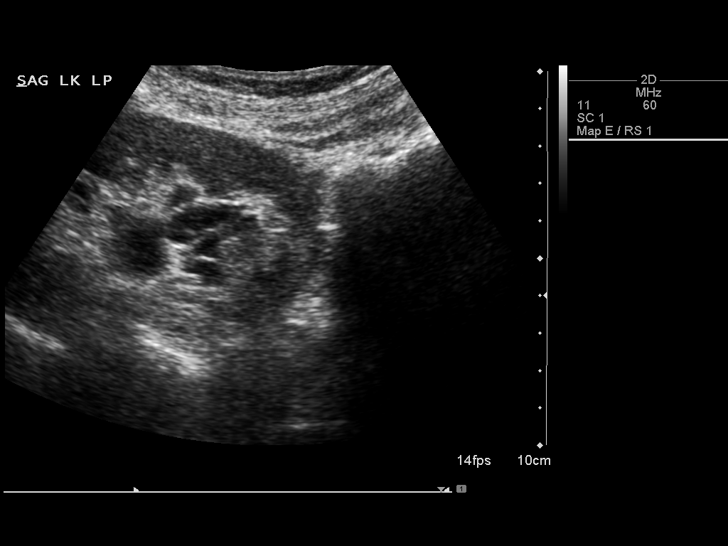
[im 100/110]
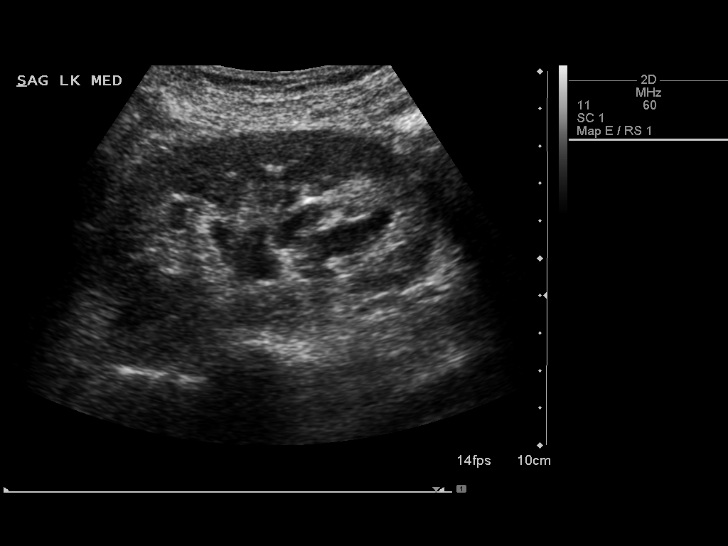
[im 110/110]
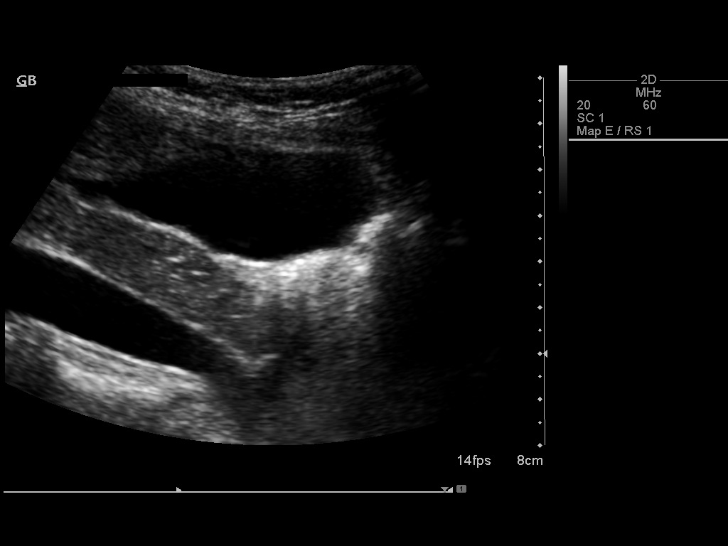

[13 of 25 positions shown; findings below may reference images not displayed]

FINDINGS: Gallbladder: No gallstones or wall thickening visualized. No
sonographic Murphy sign noted.

Common bile duct: Diameter: 2.2 mm in diameter within normal limits.

Liver: The liver shows heterogeneous echogenicity. There is a
hypoechoic lesion with peripheral hyperechoic foci measures 4.5 by
3.6 cm in right hepatic lobe. This may represent a hemangioma.
Follow-up nonemergent enhanced CT or MRI is recommended for
confirmation. A cyst is noted in right hepatic lobe inferiorly
measures 1.9 x 2.1 cm. A focal echogenic focus lower aspect of right
lobe measures 6 mm probable hemangioma.

IVC: No abnormality visualized.

Pancreas: Visualized portion unremarkable.

Spleen: Size and appearance within normal limits. Measures 5.2 cm in
length.

Right Kidney: Length: 9.7 cm.. No hydronephrosis. There is
hyperechoic lesion in lower pole measures 8 x 9 mm. This may
represent a angiomyolipoma. Attention should be given on follow-up
CT scan.

Left Kidney: Length: 10.4 cm in length. There is mild left
hydronephrosis. Less likely multiple left parapelvic cysts.. No
focal solid lesion.

Abdominal aorta: No aneurysm visualized.

Other findings: None.
IMPRESSION: 1. There is hypoechoic lesion with peripheral increased echogenicity
right hepatic lobe measures 4.5 x 3.6 cm. This may represent a
hemangioma. Follow-up enhanced CT or MRI is recommended for
confirmation.
2. Question angiomyolipoma lower pole of the right kidney measures 9
mm.
3. Mild left hydronephrosis.  Less likely multiple parapelvic cysts.

## 2015-08-02 IMAGING — US US SOFT TISSUE HEAD/NECK
1 series · 13 of 25 positions shown · non-contrast
Comparison: CT chest 10/31/2013.

CLINICAL DATA: 68-year-old female with incidentally discovered
thyroid nodules on prior CT scan completed 10/31/2013.

EXAM:
THYROID ULTRASOUND
TECHNIQUE: Ultrasound examination of the thyroid gland and adjacent soft
tissues was performed.

[Series 1: us soft tissue head/neck · 0.09mm/px · 13 of 76 slices shown]
[im 1/76]
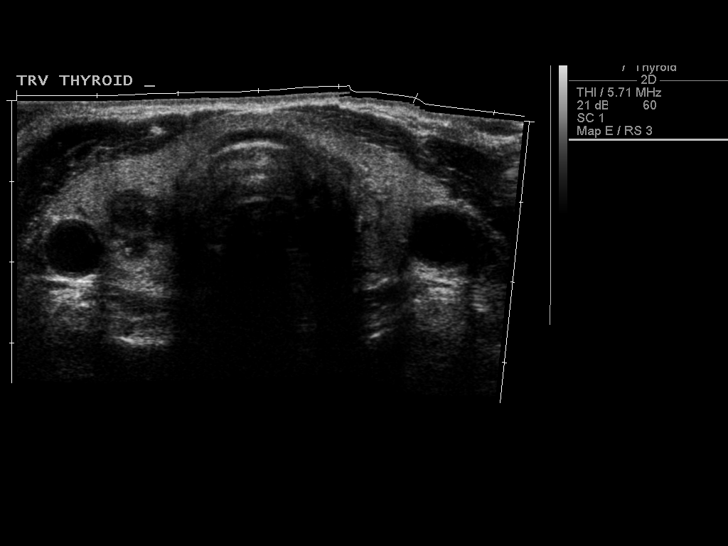
[im 7/76]
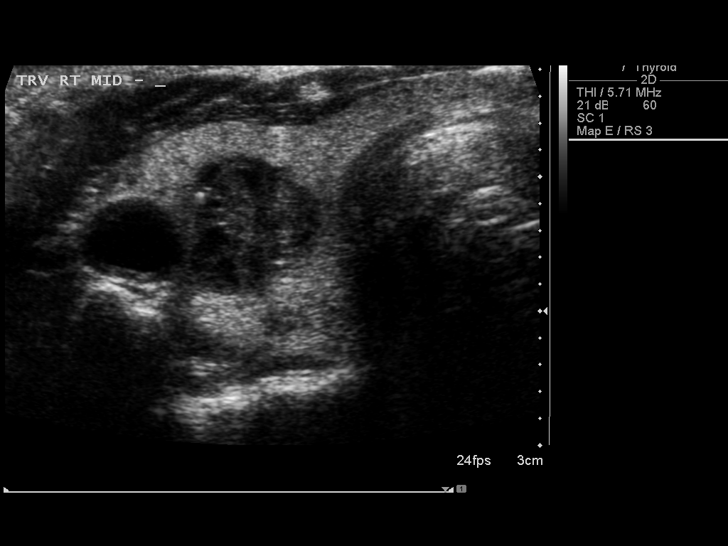
[im 13/76]
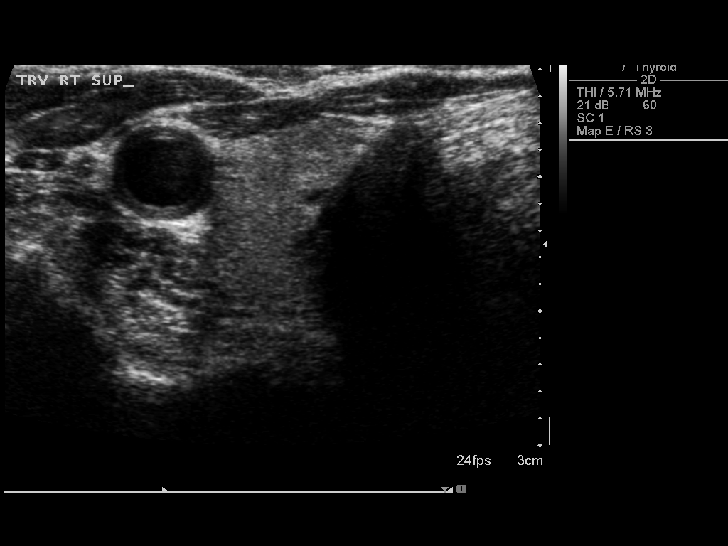
[im 19/76]
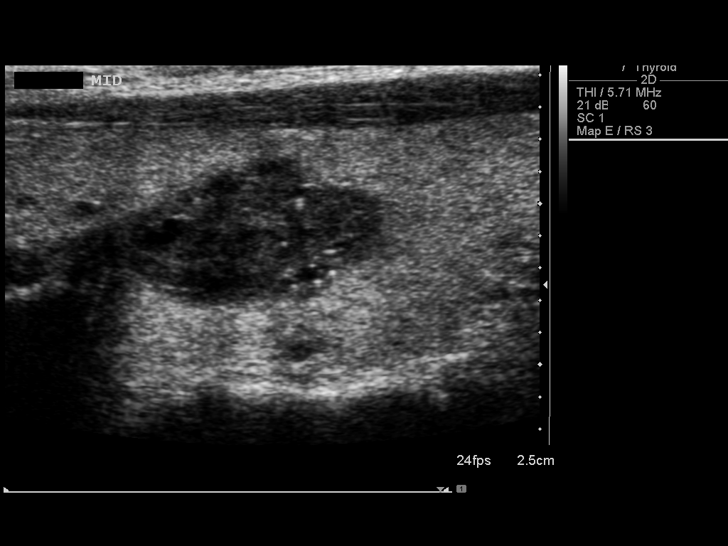
[im 26/76]
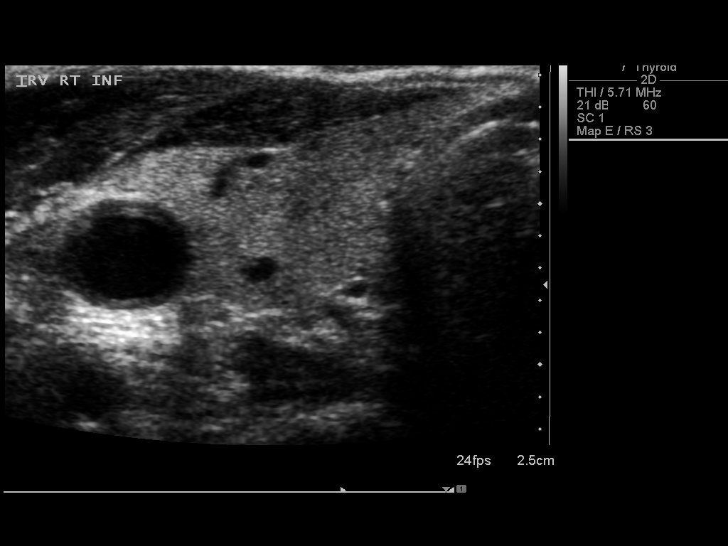
[im 32/76]
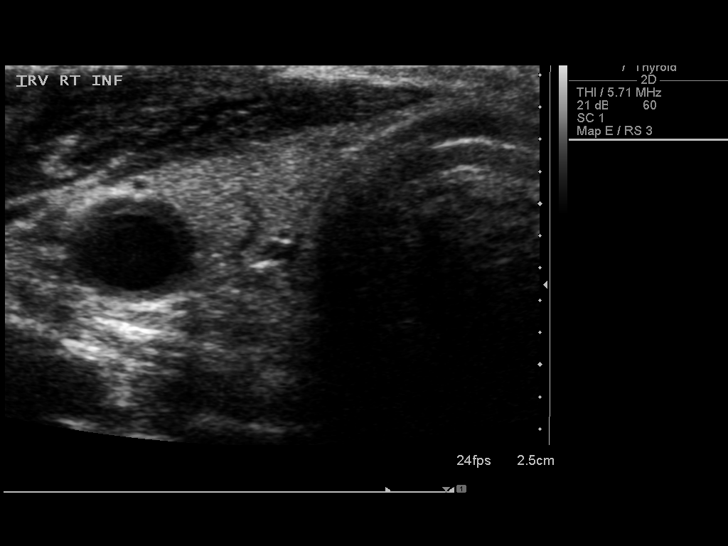
[im 38/76]
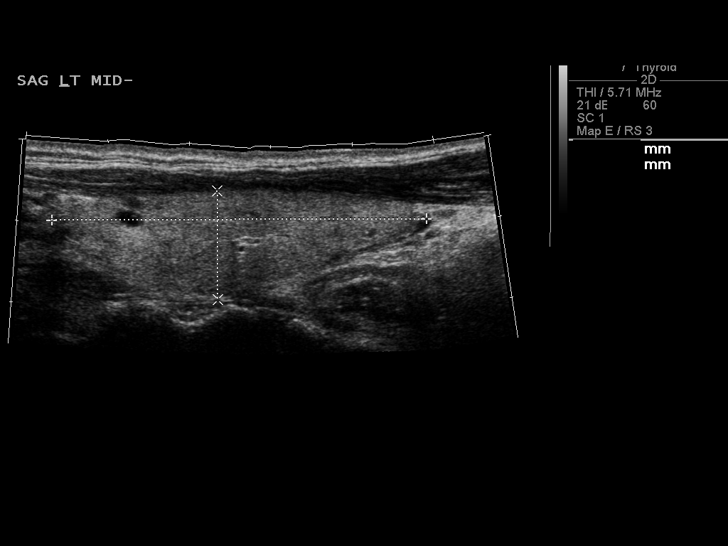
[im 44/76]
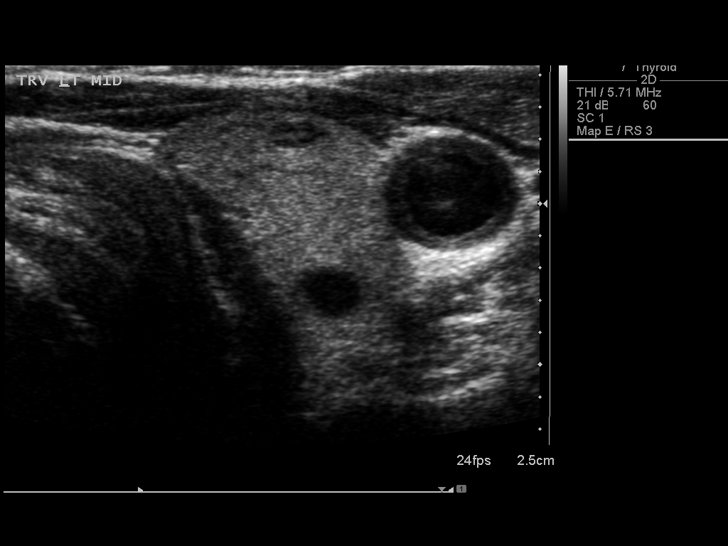
[im 51/76]
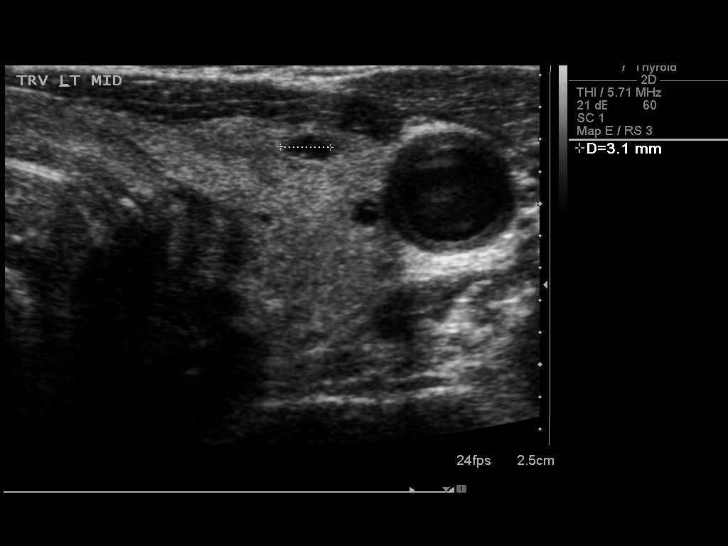
[im 57/76]
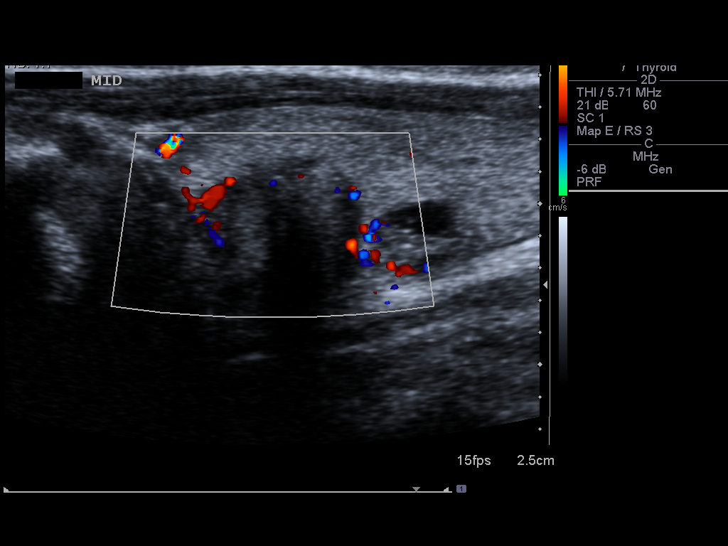
[im 63/76]
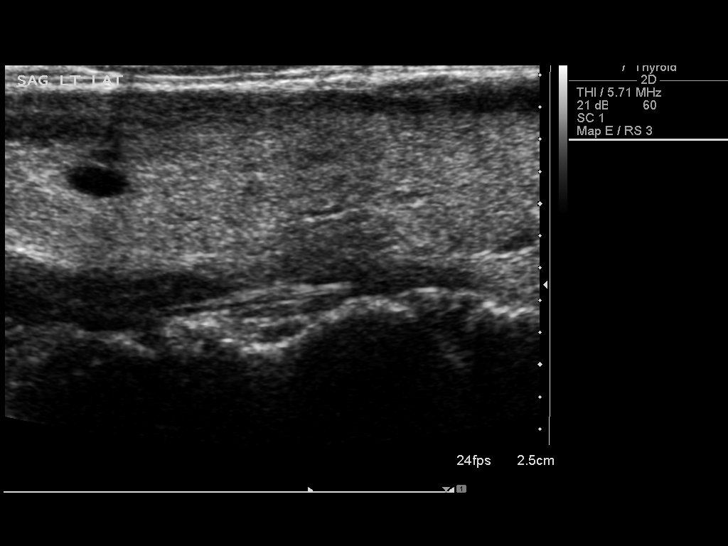
[im 69/76]
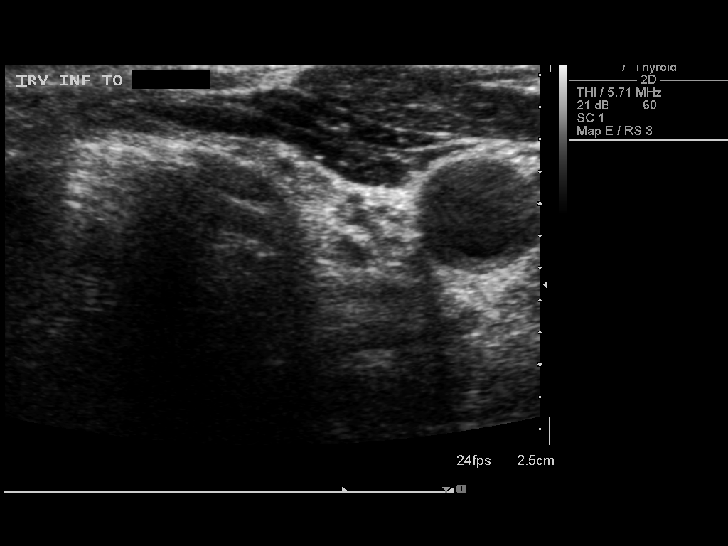
[im 76/76]
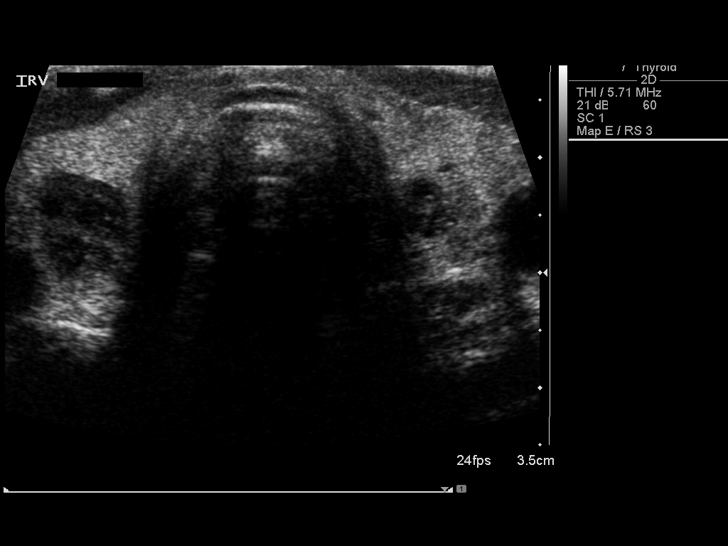

[13 of 25 positions shown; findings below may reference images not displayed]

FINDINGS: Right thyroid lobe

Measurements: 5.8 cm x 1.9 cm x 1.9 cm. Multiple nodules of the
right thyroid, with the dominant complex nodule measuring 1.1 cm x
1.1 cm x 2.0 cm. This nodule demonstrates internal reflectors,
compatible with microcalcifications and/or colloid. Increased
internal flow compared to the adjacent tissue. This nodules located
entirely within the right thyroid tissue. Additional right-sided
nodules all 1 cm.

Left thyroid lobe

Measurements: 4.6 cm x 1.3 cm x 1.5 cm. Multiple left-sided thyroid
nodules, all of which are relatively hypoechoic to the adjacent
tissue. Dominant nodule on the left measures 9 mm x 8 mm by 13 mm
with evidence of coarse calcifications.

Isthmus

Thickness: 4 mm.  No nodules visualized.

Lymphadenopathy

None visualized.
IMPRESSION: Multinodular thyroid, with the dominant right-sided nodule measuring
2 cm.

Findings do not meet current SRU consensus criteria for biopsy.
Follow-up by clinical exam is recommended. If patient has known risk
factors for thyroid carcinoma, consider follow-up ultrasound in 12
months. If patient is clinically hyperthyroid, consider nuclear
medicine thyroid uptake and scan.Reference: Management of Thyroid
Nodules Detected at US: Society of Radiologists in Ultrasound

## 2015-12-22 IMAGING — CR DG CHEST 2V
2 series · 2 of 2 positions shown · non-contrast
Comparison: March 28, 2014.

CLINICAL DATA: Pneumonia, organism unspecified.

EXAM:
CHEST  2 VIEW

[PA]
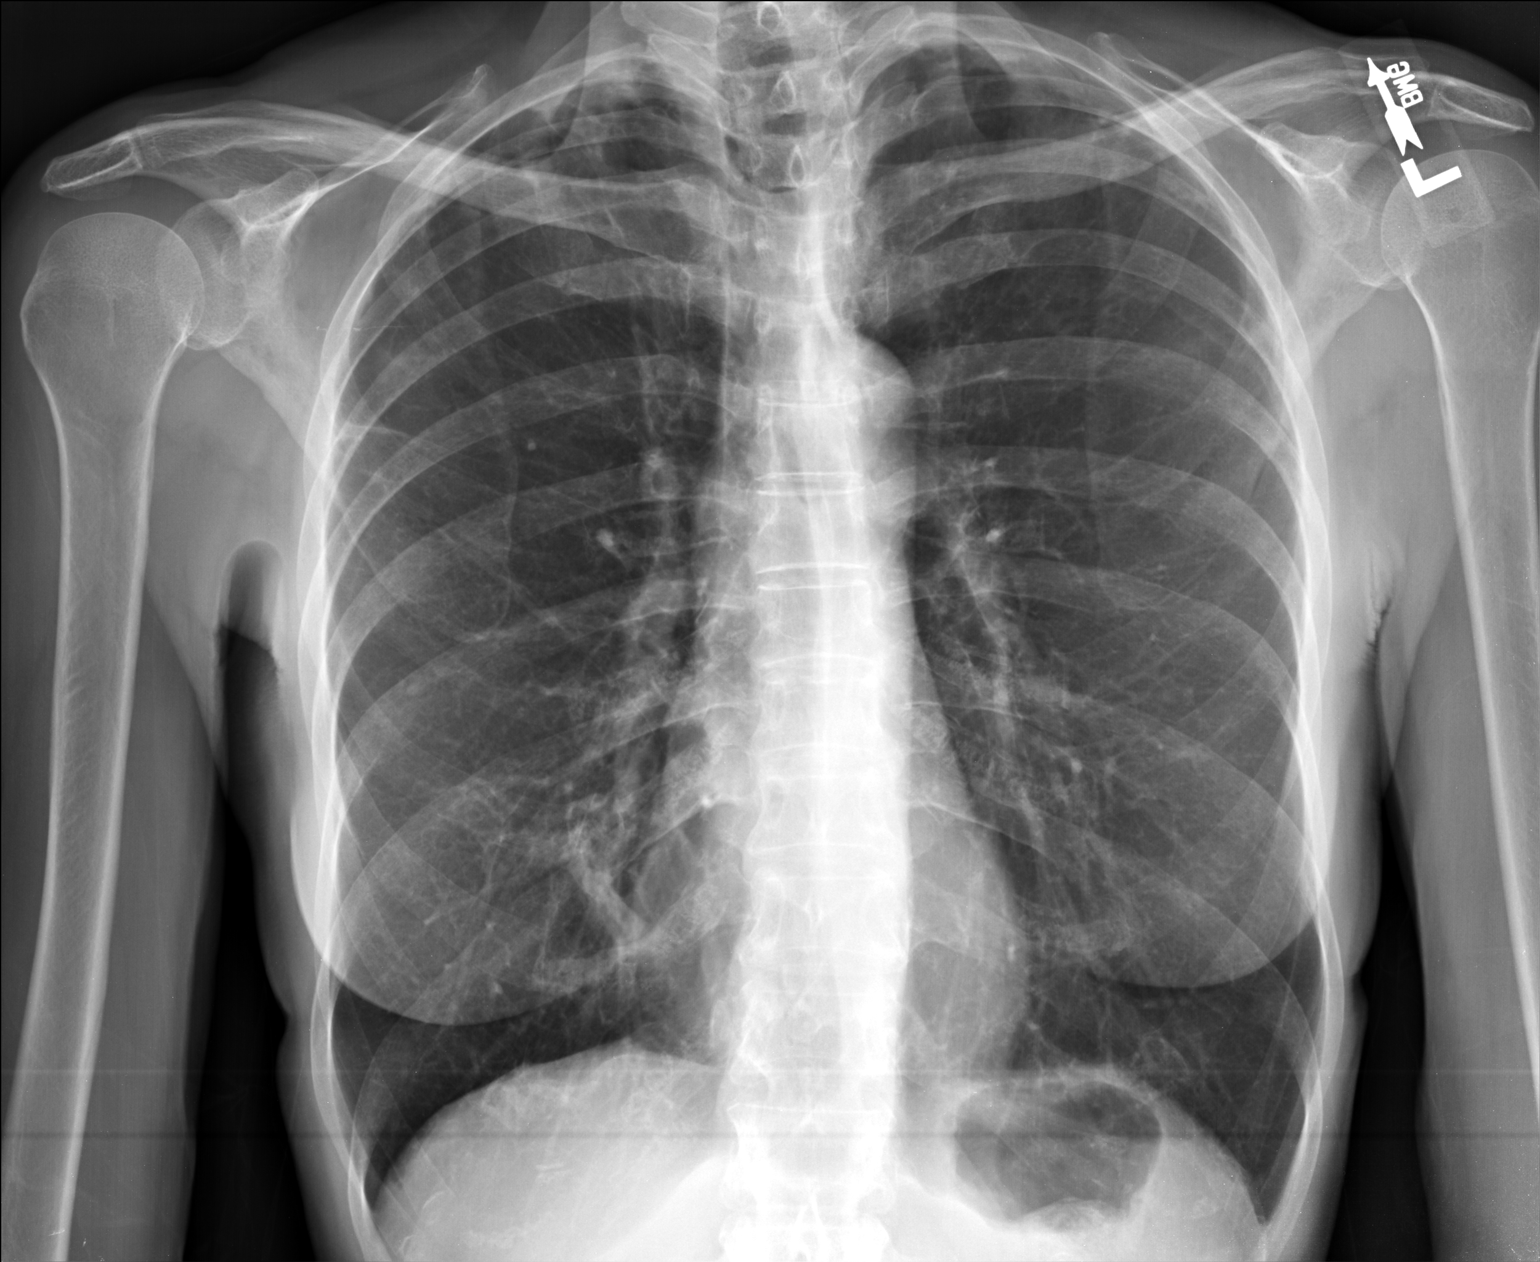

[lateral]
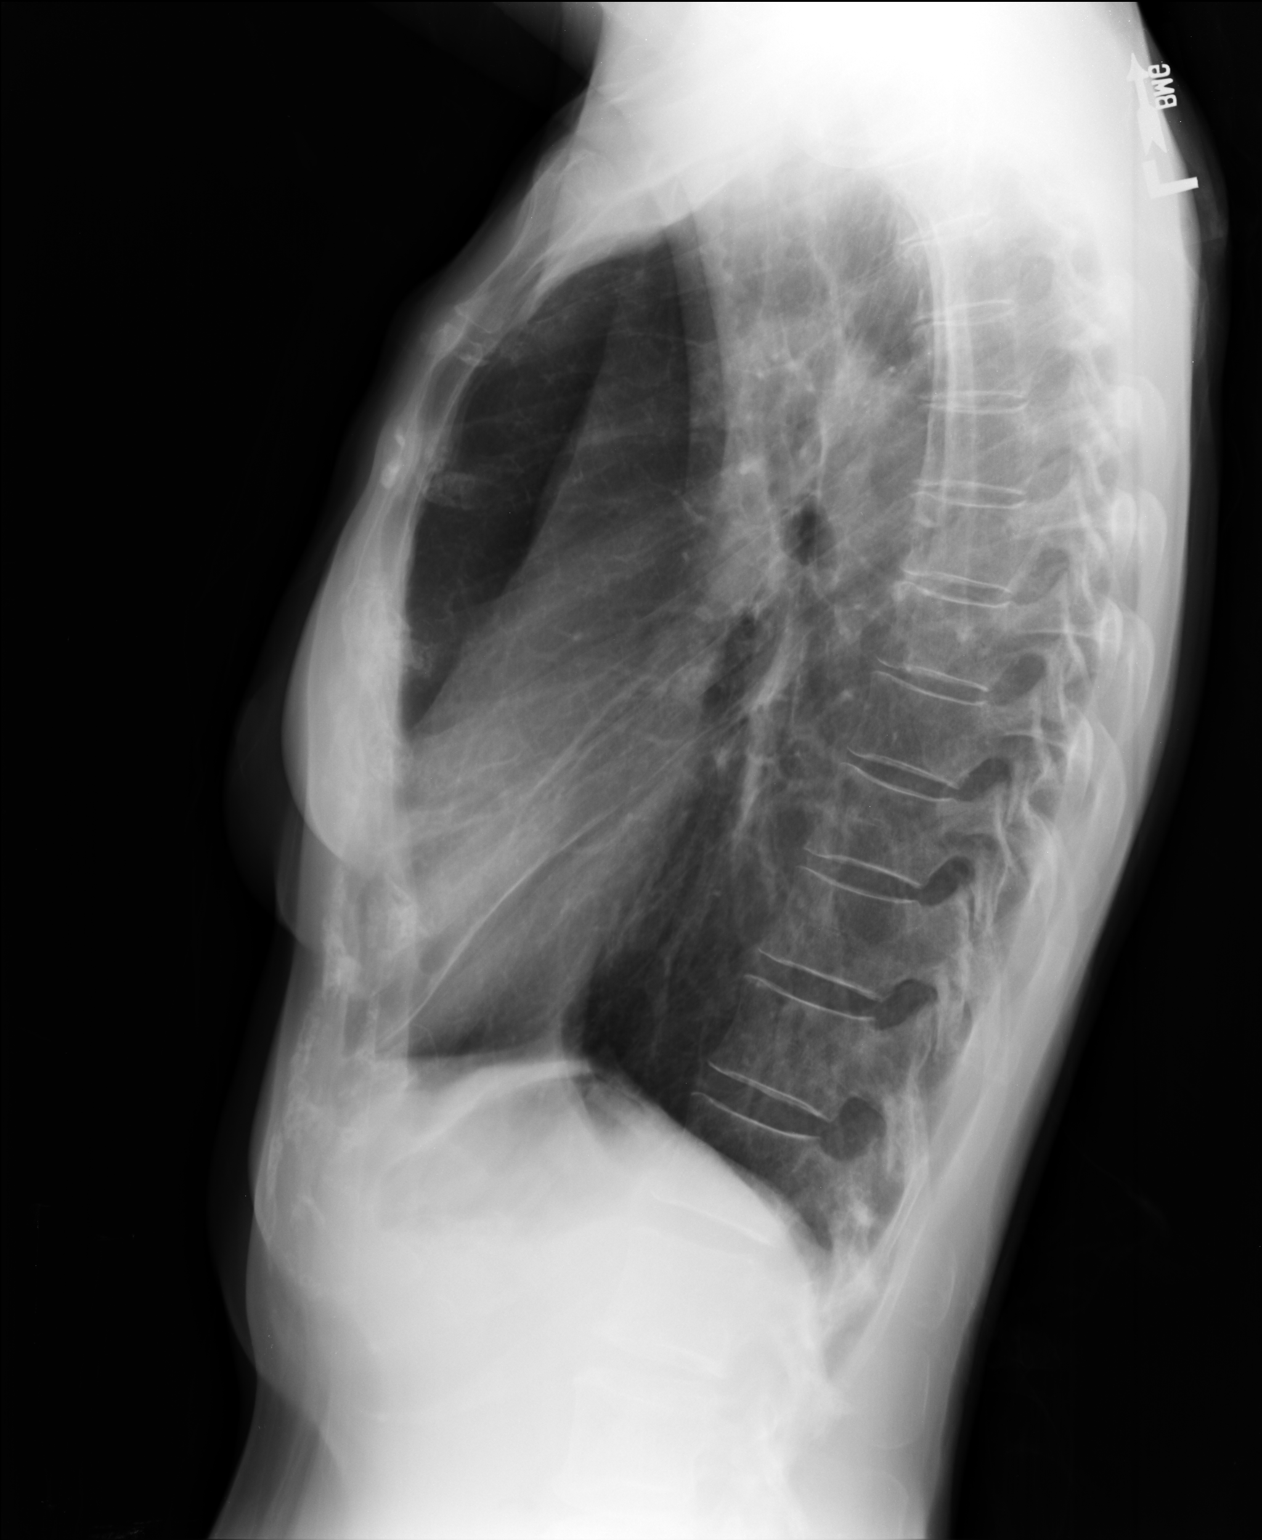

[2 of 2 positions shown; findings below may reference images not displayed]

FINDINGS: The heart size and mediastinal contours are within normal limits. No
pneumothorax or pleural effusion is noted. Hyperexpansion of the
lungs is noted. Right middle lobe opacity noted on prior exam has
nearly resolved. Left lung is clear. The visualized skeletal
structures are unremarkable.
IMPRESSION: Significantly improved right middle lobe opacity is noted consistent
with resolving pneumonia.

## 2017-01-16 ENCOUNTER — Emergency Department (HOSPITAL_BASED_OUTPATIENT_CLINIC_OR_DEPARTMENT_OTHER)
Admission: EM | Admit: 2017-01-16 | Discharge: 2017-01-16 | Disposition: A | Discharge status: Home / Self Care | Payer: Medicare Other | Attending: Emergency Medicine | Admitting: Emergency Medicine

## 2017-01-16 ENCOUNTER — Encounter (HOSPITAL_BASED_OUTPATIENT_CLINIC_OR_DEPARTMENT_OTHER): Payer: Self-pay | Admitting: Emergency Medicine

## 2017-01-16 ENCOUNTER — Other Ambulatory Visit: Payer: Self-pay

## 2017-01-16 ENCOUNTER — Emergency Department (HOSPITAL_BASED_OUTPATIENT_CLINIC_OR_DEPARTMENT_OTHER): Payer: Medicare Other

## 2017-01-16 DIAGNOSIS — S46911A Strain of unspecified muscle, fascia and tendon at shoulder and upper arm level, right arm, initial encounter: Secondary | ICD-10-CM

## 2017-01-16 DIAGNOSIS — R079 Chest pain, unspecified: Secondary | ICD-10-CM | POA: Insufficient documentation

## 2017-01-16 DIAGNOSIS — Z7982 Long term (current) use of aspirin: Secondary | ICD-10-CM | POA: Diagnosis not present

## 2017-01-16 DIAGNOSIS — R0789 Other chest pain: Secondary | ICD-10-CM

## 2017-01-16 LAB — CBC WITH DIFFERENTIAL/PLATELET
BASOS ABS: 0 10*3/uL (ref 0.0–0.1)
Basophils Relative: 0 %
Eosinophils Absolute: 0.1 10*3/uL (ref 0.0–0.7)
Eosinophils Relative: 2 %
HEMATOCRIT: 40.9 % (ref 36.0–46.0)
Hemoglobin: 13.5 g/dL (ref 12.0–15.0)
LYMPHS ABS: 1.6 10*3/uL (ref 0.7–4.0)
LYMPHS PCT: 30 %
MCH: 29.6 pg (ref 26.0–34.0)
MCHC: 33 g/dL (ref 30.0–36.0)
MCV: 89.7 fL (ref 78.0–100.0)
MONO ABS: 0.6 10*3/uL (ref 0.1–1.0)
MONOS PCT: 13 %
NEUTROS ABS: 2.8 10*3/uL (ref 1.7–7.7)
Neutrophils Relative %: 55 %
Platelets: 233 10*3/uL (ref 150–400)
RBC: 4.56 MIL/uL (ref 3.87–5.11)
RDW: 13 % (ref 11.5–15.5)
WBC: 5.1 10*3/uL (ref 4.0–10.5)

## 2017-01-16 LAB — BASIC METABOLIC PANEL
ANION GAP: 9 (ref 5–15)
BUN: 17 mg/dL (ref 6–20)
CHLORIDE: 106 mmol/L (ref 101–111)
CO2: 28 mmol/L (ref 22–32)
Calcium: 9.8 mg/dL (ref 8.9–10.3)
Creatinine, Ser: 0.87 mg/dL (ref 0.44–1.00)
GFR calc Af Amer: >60 mL/min (ref >60)
GFR calc non Af Amer: >60 mL/min (ref >60)
GLUCOSE: 88 mg/dL (ref 65–99)
POTASSIUM: 3.9 mmol/L (ref 3.5–5.1)
Sodium: 143 mmol/L (ref 135–145)

## 2017-01-16 LAB — TROPONIN I: Troponin I: <0.03 ng/mL (ref <0.03)

## 2017-01-16 MED ORDER — ASPIRIN 81 MG PO CHEW
324.0000 mg | CHEWABLE_TABLET | Freq: Once | ORAL | Status: AC
  Administered 2017-01-16: 324 mg via ORAL
  Filled 2017-01-16: qty 4

## 2017-01-16 NOTE — ED Provider Notes (Signed)
Pt signed out by Dr. Roxanne Mins pending troponin results.  Troponin is negative.  The pt's sx are not typical for CAD and are likely due to holding the baby with her right arm.  She is encouraged to f/u with her pcp and with her cardiologist.  She knows to return if worse.  She is stable for d/c.   Isla Pence, MD 01/16/17 [id number]

## 2017-01-16 NOTE — ED Triage Notes (Signed)
Pt presents with c/o right shoulder tightness for a couple days.

## 2017-01-16 NOTE — ED Notes (Signed)
ED Provider at bedside. 

## 2017-01-16 NOTE — ED Provider Notes (Signed)
[facility] EMERGENCY DEPARTMENT Provider Note   CSN: [id number] Arrival date & time: 01/16/17  0609     History   Chief Complaint Chief Complaint  Patient presents with  . Shoulder Pain    HPI Latoya Wright is a 71 y.o. female.  The history is provided by the patient.  She has no significant past history.  For the last 2 days, she has been having a vague discomfort in the right upper chest.  She describes it as a tight feeling that comes and goes.  There is no radiation of the discomfort.  She denies dyspnea, nausea, diaphoresis.  It is not affected by eating, movement, exertion.  Nothing seems to make it better.  She rates it at 5/10.  She states she has had pneumonia in the past and is worried that she might have pneumonia.  She denies any cough or fever.  She also relates that she has been taking care of a 22-month-old grandchild and had been holding her grandchild and her left arm until she developed some pain in her left elbow and recently switched to holding it in her right arm.  She has taken ibuprofen, without any discernible relief.  She has no history of diabetes, hypertension, hyperlipidemia.  She is a non-smoker.  Past Medical History:  Diagnosis Date  . Allergy   . Arthritis     Patient Active Problem List   Diagnosis Date Noted  . Pulmonary nodules 04/10/2014  . Thyroid nodule 12/12/2013  . 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 CAP (community acquired pneumonia) 11/09/2013  . Cough 11/09/2013    No past surgical history on file.  OB History    No data available       Home Medications    Prior to Admission medications   Medication Sig Start Date End Date Taking? Authorizing Provider  aspirin 81 MG tablet Take 81 mg by mouth daily.    [provider]  Biotin 1 MG CAPS Take by mouth.    [provider]  Calcium-Magnesium-Vitamin D (CALCIUM 500 PO) Take 3 tablets by mouth daily.    [provider]  Cholecalciferol 2000  UNITS CAPS Take 1 capsule by mouth daily.    [provider]  OVER THE COUNTER MEDICATION Calcium 400mg , Magnesium 400 mg, Zinc 15 mg w/Vitamin D 3 600 iu taking    [provider]    Family History Family History  Problem Relation Age of Onset  . Heart disease Father   . Heart disease Maternal Grandmother   . Heart disease Maternal Grandfather   . Heart disease Paternal Grandmother   . Heart disease Paternal Grandfather   . Rheum arthritis Sister   . Rheum arthritis Brother     Social History Social History   Tobacco Use  . Smoking status: Never Smoker  . Smokeless tobacco: Never Used  Substance Use Topics  . Alcohol use: No  . Drug use: No     Allergies   Dayquil [pseudoephedrine-apap-dm]   Review of Systems Review of Systems  All other systems reviewed and are negative.    Physical Exam Updated Vital Signs BP (!) 174/83 (BP Location: Right Arm)   Pulse 86   Temp 98 F (36.7 C) (Oral)   Resp 18   Ht 5\' 8"  (1.727 m)   Wt 50.8 kg (112 lb)   SpO2 99%   BMI 17.03 kg/m   Physical Exam  Nursing note and vitals reviewed.  71 year old  female, resting comfortably and in no acute distress. Vital signs are significant for hypertension. Oxygen saturation is 99%, which is normal. Head is normocephalic and atraumatic. PERRLA, EOMI. Oropharynx is clear. Neck is nontender and supple without adenopathy or JVD. Back is nontender and there is no CVA tenderness. Lungs are clear without rales, wheezes, or rhonchi. Chest is nontender. Heart has regular rate and rhythm without murmur. Abdomen is soft, flat, nontender without masses or hepatosplenomegaly and peristalsis is normoactive. Extremities have no cyanosis or edema, full range of motion is present. Skin is warm and dry without rash. Neurologic: Mental status is normal, cranial nerves are intact, there are no motor or sensory deficits.  ED Treatments / Results  Labs (all labs ordered are listed,  but only abnormal results are displayed) Labs Reviewed  CBC WITH DIFFERENTIAL/PLATELET  TROPONIN I  BASIC METABOLIC PANEL    EKG  EKG Interpretation  Date/Time:  Sunday January 16 2017 06:35:02 EST Ventricular Rate:  74 PR Interval:    QRS Duration: 80 QT Interval:  398 QTC Calculation: 442 R Axis:   80 Text Interpretation:  Sinus rhythm Normal ECG When compared with ECG of 01/07/2007, No significant change was found Confirmed by Delora Fuel ([id number]) on 01/16/2017 6:39:06 AM       Radiology Dg Chest 2 View  Result Date: 01/16/2017 CLINICAL DATA:  Acute onset of right chest and arm pressure. EXAM: CHEST  2 VIEW COMPARISON:  Chest radiograph performed 04/10/2014 FINDINGS: The lungs are well-aerated. Mild right midlung scarring is noted. Mild biapical scarring is seen. There is no evidence of pleural effusion or pneumothorax. The heart is normal in size; the mediastinal contour is within normal limits. No acute osseous abnormalities are seen. IMPRESSION: No acute cardiopulmonary process seen. Mild right midlung and biapical scarring noted. Electronically Signed   By: Garald Balding M.D.   On: 01/16/2017 06:53    Procedures Procedures (including critical care time)  Medications Ordered in ED Medications  aspirin chewable tablet 324 mg (324 mg Oral Given 01/16/17 1941)     Initial Impression / Assessment and Plan / ED Course  I have reviewed the triage vital signs and the nursing notes.  Pertinent labs & imaging results that were available during my care of the patient were reviewed by me and considered in my medical decision making (see chart for details).  Chest discomfort which is somewhat atypical for ACS.  Possibly musculoskeletal pain.  Old records are reviewed and she had been treated for pneumonia in March 2016.  She is given a dose of aspirin and will initiate cardiac evaluation including ECG, troponin, chest x-ray.  Heart score is 2, which puts her at low risk for  major adverse cardiac events in the next 30 days.  ECG shows no ischemic changes.  Chest x-ray shows no evidence of pneumonia.  Troponin is pending.  At this point, I feel her pain is musculoskeletal, but she did have a cardiology evaluation for possible outpatient stress testing.  Case is signed out to Dr. Gilford Raid pending troponin result.  Final Clinical Impressions(s) / ED Diagnoses   Final diagnoses:  Atypical chest pain    ED Discharge Orders    None       Delora Fuel, MD 74/08/14 [phone]

## 2017-09-07 ENCOUNTER — Encounter (HOSPITAL_BASED_OUTPATIENT_CLINIC_OR_DEPARTMENT_OTHER): Payer: Self-pay

## 2017-09-07 ENCOUNTER — Emergency Department (HOSPITAL_BASED_OUTPATIENT_CLINIC_OR_DEPARTMENT_OTHER)
Admission: EM | Admit: 2017-09-07 | Discharge: 2017-09-07 | Disposition: A | Discharge status: Home / Self Care | Payer: Medicare Other | Attending: Emergency Medicine | Admitting: Emergency Medicine

## 2017-09-07 ENCOUNTER — Other Ambulatory Visit: Payer: Self-pay

## 2017-09-07 DIAGNOSIS — R112 Nausea with vomiting, unspecified: Secondary | ICD-10-CM | POA: Diagnosis not present

## 2017-09-07 DIAGNOSIS — Z7982 Long term (current) use of aspirin: Secondary | ICD-10-CM | POA: Insufficient documentation

## 2017-09-07 DIAGNOSIS — Z79899 Other long term (current) drug therapy: Secondary | ICD-10-CM | POA: Insufficient documentation

## 2017-09-07 DIAGNOSIS — R197 Diarrhea, unspecified: Secondary | ICD-10-CM | POA: Diagnosis not present

## 2017-09-07 LAB — URINALYSIS, ROUTINE W REFLEX MICROSCOPIC
BILIRUBIN URINE: NEGATIVE
GLUCOSE, UA: NEGATIVE mg/dL
HGB URINE DIPSTICK: NEGATIVE
KETONES UR: 15 mg/dL — AB
Leukocytes, UA: NEGATIVE
NITRITE: NEGATIVE
PH: 6.5 (ref 5.0–8.0)
Protein, ur: NEGATIVE mg/dL
Specific Gravity, Urine: 1.02 (ref 1.005–1.030)

## 2017-09-07 LAB — COMPREHENSIVE METABOLIC PANEL
ALBUMIN: 4.2 g/dL (ref 3.5–5.0)
ALT: 16 U/L (ref 0–44)
AST: 27 U/L (ref 15–41)
Alkaline Phosphatase: 55 U/L (ref 38–126)
Anion gap: 11 (ref 5–15)
BILIRUBIN TOTAL: 1.2 mg/dL (ref 0.3–1.2)
BUN: 19 mg/dL (ref 8–23)
CO2: 25 mmol/L (ref 22–32)
Calcium: 9.1 mg/dL (ref 8.9–10.3)
Chloride: 108 mmol/L (ref 98–111)
Creatinine, Ser: 0.91 mg/dL (ref 0.44–1.00)
GFR calc Af Amer: >60 mL/min (ref >60)
GFR calc non Af Amer: >60 mL/min (ref >60)
GLUCOSE: 129 mg/dL — AB (ref 70–99)
POTASSIUM: 5.1 mmol/L (ref 3.5–5.1)
Sodium: 144 mmol/L (ref 135–145)
TOTAL PROTEIN: 7 g/dL (ref 6.5–8.1)

## 2017-09-07 LAB — CBC
HEMATOCRIT: 43.1 % (ref 36.0–46.0)
Hemoglobin: 14.1 g/dL (ref 12.0–15.0)
MCH: 29.7 pg (ref 26.0–34.0)
MCHC: 32.7 g/dL (ref 30.0–36.0)
MCV: 90.7 fL (ref 78.0–100.0)
Platelets: 208 10*3/uL (ref 150–400)
RBC: 4.75 MIL/uL (ref 3.87–5.11)
RDW: 13.7 % (ref 11.5–15.5)
WBC: 10.3 10*3/uL (ref 4.0–10.5)

## 2017-09-07 LAB — LIPASE, BLOOD: Lipase: 30 U/L (ref 11–51)

## 2017-09-07 MED ORDER — ONDANSETRON HCL 4 MG PO TABS: 4.0000 mg | ORAL_TABLET | Freq: Four times a day (QID) | ORAL | 0 refills | Status: DC

## 2017-09-07 MED ORDER — ONDANSETRON 4 MG PO TBDP
4.0000 mg | ORAL_TABLET | Freq: Once | ORAL | Status: AC | PRN
  Administered 2017-09-07: 4 mg via ORAL
  Filled 2017-09-07: qty 1

## 2017-09-07 MED ORDER — ONDANSETRON HCL 4 MG/2ML IJ SOLN
4.0000 mg | Freq: Once | INTRAMUSCULAR | Status: AC
  Administered 2017-09-07: 4 mg via INTRAVENOUS
  Filled 2017-09-07: qty 2

## 2017-09-07 MED ORDER — SODIUM CHLORIDE 0.9 % IV BOLUS
1000.0000 mL | Freq: Once | INTRAVENOUS | Status: AC
  Administered 2017-09-07: 1000 mL via INTRAVENOUS

## 2017-09-07 NOTE — Discharge Instructions (Signed)
You were evaluated in the emergency department for nausea and vomiting.  We did lab work and gave you some IV fluids and some nausea medications.  You were able to hold down some fluids here.  We are sending you home with some nausea medication to use there if needed.  We recommend a clear liquid diet and advance as tolerated.  Please return if any fever or worsening symptoms.

## 2017-09-07 NOTE — ED Provider Notes (Signed)
[facility] EMERGENCY DEPARTMENT Provider Note   CSN: [id number] Arrival date & time: 09/07/17  1742     History   Chief Complaint Chief Complaint  Patient presents with  . Abdominal Pain    HPI Latoya Wright is a 72 y.o. female.  She presents here today with nausea that started last night and today and was up with about 10 episodes of vomiting.  She figured it was a stomach bug and so taken some medication for diarrhea but is still had a couple of stools.  There is no particular abdominal pain.  She denies a fever but has felt cold and hot.  No chest pain or shortness of breath.  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  She has not had any recent antibiotics.  The history is provided by the patient.  Emesis   This is a new problem. The current episode started yesterday. The problem occurs more than 10 times per day. The problem has not changed since onset.The emesis has an appearance of stomach contents. There has been no fever. Associated symptoms include chills, diarrhea, a fever and sweats. Pertinent negatives include no abdominal pain, no arthralgias, no cough, no headaches, no myalgias and no URI.    Past Medical History:  Diagnosis Date  . Allergy   . Arthritis     Patient Active Problem List   Diagnosis Date Noted  . Pulmonary nodules 04/10/2014  . Thyroid nodule 12/12/2013  . 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 CAP (community acquired pneumonia) 11/09/2013  . Cough 11/09/2013    History reviewed. No pertinent surgical history.   OB History   None      Home Medications    Prior to Admission medications   Medication Sig Start Date End Date Taking? Authorizing Provider  aspirin 81 MG tablet Take 81 mg by mouth daily.    [provider]  Biotin 1 MG CAPS Take by mouth.    [provider]  Calcium-Magnesium-Vitamin D (CALCIUM 500 PO) Take 3 tablets by mouth daily.    [provider]    Cholecalciferol 2000 UNITS CAPS Take 1 capsule by mouth daily.    [provider]  OVER THE COUNTER MEDICATION Calcium 400mg , Magnesium 400 mg, Zinc 15 mg w/Vitamin D 3 600 iu taking    [provider]    Family History Family History  Problem Relation Age of Onset  . Heart disease Father   . Heart disease Maternal Grandmother   . Heart disease Maternal Grandfather   . Heart disease Paternal Grandmother   . Heart disease Paternal Grandfather   . Rheum arthritis Sister   . Rheum arthritis Brother     Social History Social History   Tobacco Use  . Smoking status: Never Smoker  . Smokeless tobacco: Never Used  Substance Use Topics  . Alcohol use: No  . Drug use: No     Allergies   Dayquil [pseudoephedrine-apap-dm]   Review of Systems Review of Systems  Constitutional: Positive for chills and fever.  HENT: Negative for ear pain and sore throat.   Eyes: Negative for pain and visual disturbance.  Respiratory: Negative for cough and shortness of breath.   Cardiovascular: Negative for chest pain and palpitations.  Gastrointestinal: Positive for diarrhea, nausea and vomiting. Negative for abdominal pain, anal bleeding and blood in stool.  Genitourinary: Negative for dysuria and hematuria.  Musculoskeletal: Negative for arthralgias, back pain and myalgias.  Skin: Negative  for color change and rash.  Neurological: Negative for seizures, syncope and headaches.  All other systems reviewed and are negative.    Physical Exam Updated Vital Signs BP 129/67 (BP Location: Left Arm)   Pulse 82   Temp 98.4 F (36.9 C) (Oral)   Resp 16   Ht 5\' 8"  (1.727 m)   Wt 48.5 kg   SpO2 99%   BMI 16.27 kg/m   Physical Exam  Constitutional: She appears well-developed and well-nourished. No distress.  HENT:  Head: Normocephalic and atraumatic.  Eyes: Conjunctivae are normal.  Neck: Neck supple.  Cardiovascular: Normal rate and regular rhythm.  No murmur  heard. Pulmonary/Chest: Effort normal and breath sounds normal. No respiratory distress.  Abdominal: Soft. Bowel sounds are normal. There is no tenderness. There is no rigidity, no rebound and no guarding.  Musculoskeletal: Normal range of motion. She exhibits no edema or deformity.  Neurological: She is alert.  Skin: Skin is warm and dry. Capillary refill takes less than 2 seconds.  Psychiatric: She has a normal mood and affect.  Nursing note and vitals reviewed.    ED Treatments / Results  Labs (all labs ordered are listed, but only abnormal results are displayed) Labs Reviewed  COMPREHENSIVE METABOLIC PANEL - Abnormal; Notable for the following components:      Result Value   Glucose, Bld 129 (*)    All other components within normal limits  URINALYSIS, ROUTINE W REFLEX MICROSCOPIC - Abnormal; Notable for the following components:   Ketones, ur 15 (*)    All other components within normal limits  LIPASE, BLOOD  CBC    EKG None  Radiology No results found.  Procedures Procedures (including critical care time)  Medications Ordered in ED Medications  ondansetron (ZOFRAN-ODT) disintegrating tablet 4 mg (4 mg Oral Given 09/07/17 1822)  sodium chloride 0.9 % bolus 1,000 mL (0 mLs Intravenous Stopped 09/07/17 2045)  ondansetron (ZOFRAN) injection 4 mg (4 mg Intravenous Given 09/07/17 1936)     Initial Impression / Assessment and Plan / ED Course  I have reviewed the triage vital signs and the nursing notes.  Pertinent labs & imaging results that were available during my care of the patient were reviewed by me and considered in my medical decision making (see chart for details).  Clinical Course as of Sep 10 1327  Wed Sep 07, 2017  1924 Healthy 72 year old female here with nausea since last night and multiple episodes of vomiting today with a little bit of loose stool.  No recent antibiotics.  No blood from either end.  She is otherwise well-appearing she just states that  she does not want to get any worse.  Her lab work is been unremarkable other than some ketones in her urine.  Giving her IV fluids some nausea medicine and will try to p.o. trial her.   [MB]  2038 Patient's work-up here has been unremarkable.  Her symptoms of nausea have improved she is had ice chips and is tolerating some ginger ale.  She is comfortable going home and I told her we prescribe her some nausea medicine.  She understands to watch out for any fever or worsening of her symptoms and to follow-up with your doctor or return to the ED.   [MB]    Clinical Course User Index [MB] Hayden Rasmussen, MD     Final Clinical Impressions(s) / ED Diagnoses   Final diagnoses:  Nausea vomiting and diarrhea    ED Discharge Orders  Ordered    ondansetron (ZOFRAN) 4 MG tablet  Every 6 hours     09/07/17 2040           Hayden Rasmussen, MD 09/09/17 1330

## 2017-09-07 NOTE — ED Triage Notes (Signed)
Pt c/o N/V/D and abd pain that started about midnight last night. In regards to the diarrhea pt does endorse taking a laxative.

## 2018-06-05 ENCOUNTER — Encounter: Payer: Self-pay | Admitting: Family Medicine

## 2018-06-05 ENCOUNTER — Ambulatory Visit (INDEPENDENT_AMBULATORY_CARE_PROVIDER_SITE_OTHER): Payer: Medicare Other | Admitting: Family Medicine

## 2018-06-05 VITALS — Ht 68.0 in | Wt 107.0 lb

## 2018-06-05 DIAGNOSIS — M810 Age-related osteoporosis without current pathological fracture: Secondary | ICD-10-CM

## 2018-06-05 NOTE — Progress Notes (Signed)
Chief Complaint:  Latoya Wright is a 73 y.o. female who presents for a telephone visit with a chief complaint of osteoporosis and to establish care.  Assessment/Plan:  Osteoporosis Stable.  Continue Fosamax 70mg  weekly. Will finish 5 year cycle in 2-3 years.   Preventative Healthcare Patient was instructed to return soon for CPE. Health Maintenance Due  Topic Date Due  . Hepatitis C Screening  09-18-1945  . MAMMOGRAM  12/26/2015     Subjective:  HPI:  Osteoporosis  Diagnosed 2 to 3 years ago.  Currently on Fosamax 70 mg weekly.  Tolerating well.  She is also on calcium and vitamin D supplementation.  ROS: Per HPI, otherwise a complete review of systems was negative.   PMH:  The following were reviewed and entered/updated in epic: Past Medical History:  Diagnosis Date  . Allergy   . Arthritis    Patient Active Problem List   Diagnosis Date Noted  . Pulmonary nodules 04/10/2014  . Thyroid nodule 12/12/2013  . 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 No pertinent surgical history.  Family History  Problem Relation Age of Onset  . Heart disease Father   . Heart disease Maternal Grandmother   . Heart disease Maternal Grandfather   . Heart disease Paternal Grandmother   . Heart disease Paternal Grandfather   . Rheum arthritis Sister   . Rheum arthritis Brother     Medications- reviewed and updated Current Outpatient Medications  Medication Sig Dispense Refill  . alendronate (FOSAMAX) 70 MG tablet Take in AM with a full glass of water, on an empty stomach, and do not take anything else po or lie down for the next 30 min.    Marland Kitchen aspirin 81 MG tablet Take 81 mg by mouth daily.    . Biotin 1 MG CAPS Take by mouth.    . Calcium-Magnesium-Vitamin D (CALCIUM 500 PO) Take 3 tablets by mouth daily.    . Cholecalciferol 2000 UNITS CAPS Take 1 capsule by mouth daily.    Marland Kitchen OVER THE COUNTER MEDICATION Calcium 400mg , Magnesium 400 mg, Zinc 15 mg w/Vitamin D 3 600 iu  taking     No current facility-administered medications for this visit.     Allergies-reviewed and updated Allergies  Allergen Reactions  . Dayquil [Pseudoephedrine-Apap-Dm]     jittery    Social History   Socioeconomic History  . Marital status: Married    Spouse name: Not on file  . Number of children: Not on file  . Years of education: Not on file  . Highest education level: Not on file  Occupational History  . Not on file  Social Needs  . Financial resource strain: Not on file  . Food insecurity:    Worry: Not on file    Inability: Not on file  . Transportation needs:    Medical: Not on file    Non-medical: Not on file  Tobacco Use  . Smoking status: Never Smoker  . Smokeless tobacco: Never Used  Substance and Sexual Activity  . Alcohol use: No  . Drug use: No  . Sexual activity: Not on file  Lifestyle  . Physical activity:    Days per week: Not on file    Minutes per session: Not on file  . Stress: Not on file  Relationships  . Social connections:    Talks on phone: Not on file    Gets together: Not on file    Attends religious service: Not on file  Active member of club or organization: Not on file    Attends meetings of clubs or organizations: Not on file    Relationship status: Not on file  Other Topics Concern  . Not on file  Social History Narrative  . Not on file          Objective/Observations   NAD, speaking in full sentences, normal affect and thought content  Telephone Visit   I connected with Latoya Wright on 06/05/18 at  2:20 PM EDT via telephone and verified that I am speaking with the correct person using two identifiers. I discussed the limitations of evaluation and management by telemedicine and the availability of in person appointments. The patient expressed understanding and agreed to proceed.   Patient location: Home Provider location: [place] participating in the virtual visit: Myself and  patient      Algis Greenhouse. Jerline Pain, MD 06/05/2018 4:12 PM

## 2018-09-23 ENCOUNTER — Emergency Department (HOSPITAL_BASED_OUTPATIENT_CLINIC_OR_DEPARTMENT_OTHER): Payer: Medicare Other

## 2018-09-23 ENCOUNTER — Inpatient Hospital Stay (HOSPITAL_BASED_OUTPATIENT_CLINIC_OR_DEPARTMENT_OTHER)
Admission: EM | Admit: 2018-09-23 | Discharge: 2018-09-26 | DRG: 470 | Disposition: A | Discharge status: Home Health | Payer: Medicare Other | Source: Other Acute Inpatient Hospital | Attending: Internal Medicine | Admitting: Internal Medicine

## 2018-09-23 ENCOUNTER — Other Ambulatory Visit: Payer: Self-pay

## 2018-09-23 ENCOUNTER — Encounter (HOSPITAL_BASED_OUTPATIENT_CLINIC_OR_DEPARTMENT_OTHER): Payer: Self-pay | Admitting: Emergency Medicine

## 2018-09-23 DIAGNOSIS — S72002A Fracture of unspecified part of neck of left femur, initial encounter for closed fracture: Secondary | ICD-10-CM | POA: Diagnosis present

## 2018-09-23 DIAGNOSIS — S72102A Unspecified trochanteric fracture of left femur, initial encounter for closed fracture: Secondary | ICD-10-CM | POA: Diagnosis not present

## 2018-09-23 DIAGNOSIS — Z79899 Other long term (current) drug therapy: Secondary | ICD-10-CM | POA: Diagnosis not present

## 2018-09-23 DIAGNOSIS — R030 Elevated blood-pressure reading, without diagnosis of hypertension: Secondary | ICD-10-CM | POA: Diagnosis present

## 2018-09-23 DIAGNOSIS — Z96642 Presence of left artificial hip joint: Secondary | ICD-10-CM | POA: Diagnosis not present

## 2018-09-23 DIAGNOSIS — Z8261 Family history of arthritis: Secondary | ICD-10-CM | POA: Diagnosis not present

## 2018-09-23 DIAGNOSIS — M8588 Other specified disorders of bone density and structure, other site: Secondary | ICD-10-CM | POA: Diagnosis present

## 2018-09-23 DIAGNOSIS — W11XXXA Fall on and from ladder, initial encounter: Secondary | ICD-10-CM | POA: Diagnosis present

## 2018-09-23 DIAGNOSIS — Z01818 Encounter for other preprocedural examination: Secondary | ICD-10-CM

## 2018-09-23 DIAGNOSIS — M810 Age-related osteoporosis without current pathological fracture: Secondary | ICD-10-CM | POA: Diagnosis present

## 2018-09-23 DIAGNOSIS — Z888 Allergy status to other drugs, medicaments and biological substances status: Secondary | ICD-10-CM | POA: Diagnosis not present

## 2018-09-23 DIAGNOSIS — Z20828 Contact with and (suspected) exposure to other viral communicable diseases: Secondary | ICD-10-CM | POA: Diagnosis present

## 2018-09-23 DIAGNOSIS — M1990 Unspecified osteoarthritis, unspecified site: Secondary | ICD-10-CM | POA: Diagnosis present

## 2018-09-23 DIAGNOSIS — S72042A Displaced fracture of base of neck of left femur, initial encounter for closed fracture: Secondary | ICD-10-CM | POA: Diagnosis not present

## 2018-09-23 DIAGNOSIS — S72009A Fracture of unspecified part of neck of unspecified femur, initial encounter for closed fracture: Secondary | ICD-10-CM

## 2018-09-23 DIAGNOSIS — S7290XA Unspecified fracture of unspecified femur, initial encounter for closed fracture: Secondary | ICD-10-CM | POA: Diagnosis present

## 2018-09-23 LAB — CBC WITH DIFFERENTIAL/PLATELET
Abs Immature Granulocytes: 0.06 10*3/uL (ref 0.00–0.07)
Basophils Absolute: 0 10*3/uL (ref 0.0–0.1)
Basophils Relative: 0 %
Eosinophils Absolute: 0 10*3/uL (ref 0.0–0.5)
Eosinophils Relative: 0 %
HCT: 41 % (ref 36.0–46.0)
Hemoglobin: 13.1 g/dL (ref 12.0–15.0)
Immature Granulocytes: 0 %
Lymphocytes Relative: 7 %
Lymphs Abs: 0.9 10*3/uL (ref 0.7–4.0)
MCH: 28.5 pg (ref 26.0–34.0)
MCHC: 32 g/dL (ref 30.0–36.0)
MCV: 89.1 fL (ref 80.0–100.0)
Monocytes Absolute: 0.8 10*3/uL (ref 0.1–1.0)
Monocytes Relative: 6 %
Neutro Abs: 11.8 10*3/uL — ABNORMAL HIGH (ref 1.7–7.7)
Neutrophils Relative %: 87 %
Platelets: 196 10*3/uL (ref 150–400)
RBC: 4.6 MIL/uL (ref 3.87–5.11)
RDW: 12.9 % (ref 11.5–15.5)
WBC: 13.6 10*3/uL — ABNORMAL HIGH (ref 4.0–10.5)
nRBC: 0 % (ref 0.0–0.2)

## 2018-09-23 LAB — BASIC METABOLIC PANEL
Anion gap: 11 (ref 5–15)
BUN: 19 mg/dL (ref 8–23)
CO2: 23 mmol/L (ref 22–32)
Calcium: 9.2 mg/dL (ref 8.9–10.3)
Chloride: 106 mmol/L (ref 98–111)
Creatinine, Ser: 0.88 mg/dL (ref 0.44–1.00)
GFR calc Af Amer: >60 mL/min (ref >60)
GFR calc non Af Amer: >60 mL/min (ref >60)
Glucose, Bld: 118 mg/dL — ABNORMAL HIGH (ref 70–99)
Potassium: 4.2 mmol/L (ref 3.5–5.1)
Sodium: 140 mmol/L (ref 135–145)

## 2018-09-23 MED ORDER — MORPHINE SULFATE (PF) 4 MG/ML IV SOLN
4.0000 mg | Freq: Once | INTRAVENOUS | Status: AC
  Administered 2018-09-23: 4 mg via INTRAVENOUS
  Filled 2018-09-23: qty 1

## 2018-09-23 MED ORDER — ONDANSETRON HCL 4 MG/2ML IJ SOLN
4.0000 mg | Freq: Once | INTRAMUSCULAR | Status: AC
  Administered 2018-09-23: 4 mg via INTRAVENOUS
  Filled 2018-09-23: qty 2

## 2018-09-23 MED ORDER — ENOXAPARIN SODIUM 40 MG/0.4ML ~~LOC~~ SOLN: 40.0000 mg | SUBCUTANEOUS | Status: DC

## 2018-09-23 NOTE — Progress Notes (Signed)
Patient ID: Latoya Wright, female   DOB: 05/17/1945, 73 y.o.   MRN: [id number] I was called in consultation for this patient who is at [facility].  According to the notes she sustained a mechanical fall injuring her left hip.  I have reviewed the x-rays.  It does show a left hip femoral neck fracture.  The usual recommendation for a fracture such as this would be an operative intervention with potentially cannulated screw fixation versus a total hip replacement.  She can eat today from our standpoint.  Surgery will likely be tomorrow pending medical clearance and since she will require transfer to one of the hospitals in [place].  I would like to be at least informed when she arrives to the hospital so she can be examined and surgical planning can be made.

## 2018-09-23 NOTE — ED Notes (Signed)
Patient stated that she does not need any pain med at this time.

## 2018-09-23 NOTE — ED Triage Notes (Signed)
Pt fell off of a ladder, 3 steps up, landed on L side. C/o L hip pain. Denies LOC. Did not hit her head.

## 2018-09-23 NOTE — ED Notes (Signed)
[facility] notified (Tammy) - patient ready for transport 

## 2018-09-23 NOTE — ED Notes (Addendum)
EMT Belenda Cruise and RN Chanin placed purewick on pt. Pt comfortable.

## 2018-09-23 NOTE — ED Provider Notes (Signed)
[facility] EMERGENCY DEPARTMENT Provider Note   CSN: [id number] Arrival date & time: 09/23/18  1625     History   Chief Complaint Chief Complaint  Patient presents with  . Fall    HPI Latoya Wright is a 73 y.o. female presents today following fall.  Patient reports she was helping her neighbor with the garden just prior to arrival when she fell backwards off the third step of a ladder landing on her left buttocks.  Patient reports she had immediate pain to her left hip, moderate intensity worsened with movement and palpation and improved with rest, nonradiating pain that has slowly improved since onset.  Patient reports that if she lies still she is in no pain.  Patient denies head injury, loss of consciousness, blood thinner use, neck pain, back pain or pain to her extremities x4.  She denies any nausea, vomiting, abdominal or chest pain.  Patient denies any additional concerns today.    HPI  Past Medical History:  Diagnosis Date  . Allergy   . Arthritis     Patient Active Problem List   Diagnosis Date Noted  . Left displaced femoral neck fracture ([place]) 09/23/2018  . Pulmonary nodules 04/10/2014  . Thyroid nodule 12/12/2013  . 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 No pertinent surgical history.   OB History   No obstetric history on file.      Home Medications    Prior to Admission medications   Medication Sig Start Date End Date Taking? Authorizing Provider  alendronate (FOSAMAX) 70 MG tablet Take in AM with a full glass of water, on an empty stomach, and do not take anything else po or lie down for the next 30 min. 09/21/12   [provider]  Biotin 1 MG CAPS Take by mouth.    [provider]  Calcium-Magnesium-Vitamin D (CALCIUM 500 PO) Take 3 tablets by mouth daily.    [provider]  Cholecalciferol 2000 UNITS CAPS Take 1 capsule by mouth daily.    [provider]  OVER THE COUNTER MEDICATION Calcium  400mg , Magnesium 400 mg, Zinc 15 mg w/Vitamin D 3 600 iu taking    [provider]    Family History Family History  Problem Relation Age of Onset  . Heart disease Father   . Heart disease Maternal Grandmother   . Heart disease Maternal Grandfather   . Heart disease Paternal Grandmother   . Heart disease Paternal Grandfather   . Rheum arthritis Sister   . Rheum arthritis Brother     Social History Social History   Tobacco Use  . Smoking status: Never Smoker  . Smokeless tobacco: Never Used  Substance Use Topics  . Alcohol use: No  . Drug use: No     Allergies   Dayquil [pseudoephedrine-apap-dm]   Review of Systems Review of Systems Ten systems are reviewed and are negative for acute change except as noted in the HPI  Physical Exam Updated Vital Signs BP (!) 157/77 (BP Location: Right Arm)   Pulse 89   Temp 99 F (37.2 C) (Oral)   Resp 16   Ht 5\' 8"  (1.727 m)   Wt 48.1 kg   SpO2 97%   BMI 16.12 kg/m   Physical Exam Constitutional:      General: She is not in acute distress.    Appearance: Normal appearance. She is well-developed. She is not ill-appearing or diaphoretic.  HENT:     Head: Normocephalic and  atraumatic.     Right Ear: External ear normal.     Left Ear: External ear normal.     Nose: Nose normal.  Eyes:     General: Vision grossly intact. Gaze aligned appropriately.     Pupils: Pupils are equal, round, and reactive to light.  Neck:     Musculoskeletal: Normal range of motion.     Trachea: Trachea and phonation normal. No tracheal deviation.  Cardiovascular:     Pulses:          Dorsalis pedis pulses are 2+ on the right side and 2+ on the left side.  Pulmonary:     Effort: Pulmonary effort is normal. No respiratory distress.  Chest:     Chest wall: No deformity, tenderness or crepitus.  Abdominal:     General: There is no distension.     Palpations: Abdomen is soft.     Tenderness: There is no abdominal tenderness. There is no  guarding or rebound.  Musculoskeletal:     Left hip: She exhibits decreased range of motion and tenderness. She exhibits no swelling.     Right lower leg: Normal.     Left lower leg: Normal.     Comments: Patient with tenderness of the left hip without obvious deformity.  No visible shortening of the left leg.  Pedal pulses and capillary refill intact bilaterally.  Sensation intact to bilateral lower extremities.  Compartments soft to palpation. - No midline C/T/L spinal tenderness to palpation, no paraspinal muscle tenderness, no deformity, crepitus, or step-off noted. No sign of injury to the neck or back.  Feet:     Right foot:     Protective Sensation: 3 sites tested. 3 sites sensed.     Left foot:     Protective Sensation: 3 sites tested. 3 sites sensed.  Skin:    General: Skin is warm and dry.  Neurological:     Mental Status: She is alert.     GCS: GCS eye subscore is 4. GCS verbal subscore is 5. GCS motor subscore is 6.     Comments: Speech is clear and goal oriented, follows commands Major Cranial nerves without deficit, no facial droop Normal strength in upper and lower extremities bilaterally including dorsiflexion and plantar flexion, strong and equal grip strength Sensation normal to light and sharp touch Moves extremities without ataxia, coordination intact  Psychiatric:        Behavior: Behavior normal.    ED Treatments / Results  Labs (all labs ordered are listed, but only abnormal results are displayed) Labs Reviewed  CBC WITH DIFFERENTIAL/PLATELET - Abnormal; Notable for the following components:      Result Value   WBC 13.6 (*)    Neutro Abs 11.8 (*)    All other components within normal limits  BASIC METABOLIC PANEL - Abnormal; Notable for the following components:   Glucose, Bld 118 (*)    All other components within normal limits  SARS CORONAVIRUS 2 (TAT 6-12 HRS)    EKG None  Radiology Dg Hip Unilat With Pelvis 2-3 Views Left  Result Date:  09/23/2018 CLINICAL DATA:  Acute LEFT hip pain following fall today. Initial encounter. EXAM: DG HIP (WITH OR WITHOUT PELVIS) 2-3V LEFT COMPARISON:  None. FINDINGS: A minimally impacted LEFT femoral neck fracture is identified. No evidence of dislocation. No other acute bony abnormality noted. IMPRESSION: Minimally impacted LEFT femoral neck fracture. Electronically Signed   By: Margarette Canada M.D.   On: 09/23/2018 17:38  Procedures Procedures (including critical care time)  Medications Ordered in ED Medications  morphine 4 MG/ML injection 4 mg (has no administration in time range)  morphine 4 MG/ML injection 4 mg (4 mg Intravenous Given 09/23/18 2002)  ondansetron (ZOFRAN) injection 4 mg (4 mg Intravenous Given 09/23/18 2002)     Initial Impression / Assessment and Plan / ED Course  I have reviewed the triage vital signs and the nursing notes.  Pertinent labs & imaging results that were available during my care of the patient were reviewed by me and considered in my medical decision making (see chart for details).    DG Pelvis w/ Left Hip:  IMPRESSION:  Minimally impacted LEFT femoral neck fracture.  - Tenderness of the left hip with palpation.  Decreased range of motion secondary to pain.  Neurovascular intact to the bilateral lower extremities.  No sign of significant head, neck, back, chest or abdominal injury.  She is overall well-appearing in no acute distress.  She is refusing pain medication at this time as she is without pain while lying still.  She states understanding of imaging results today and need for admission and orthopedic consultation and she is agreeable. - Discussed case with orthopedic surgeon Dr. Ninfa Linden who has asked for hospitalist, orthopedics team to see patient. - Discussed case with Dr. Linda Hedges from hospital service will be admitting patient to the hospital. - CBC with leukocytosis of 13.6 with left shift, patient without infectious-like symptoms, suspect  secondary to pain and injury today BMP nonacute COVID-19 pending - Patient now requesting pain medication, 4 mg morphine and 4 mg Zofran ordered. - Patient reassessed resting comfortably no acute distress states improvement of pain after morphine she has no further complaints at this time she states understanding of care plan and is agreeable. - 9:45 PM: Patient reevaluated resting comfortably no acute distress.  She reports some worsening of her left hip pain and is requesting additional pain medication.  Additional 4 mg morphine ordered.  Patient is awaiting transportation at this time.  Patient has been admitted to hospital service for further evaluation management.   Patient seen and evaluated by Dr. Maryan Rued during this visit.  Note: Portions of this report may have been transcribed using voice recognition software. Every effort was made to ensure accuracy; however, inadvertent computerized transcription errors may still be present. Final Clinical Impressions(s) / ED Diagnoses   Final diagnoses:  Closed fracture of neck of left femur, initial encounter [facility])    ED Discharge Orders    None       Gari Crown 09/23/18 2154    Blanchie Dessert, MD 09/23/18 2326

## 2018-09-24 ENCOUNTER — Inpatient Hospital Stay (HOSPITAL_COMMUNITY): Payer: Medicare Other

## 2018-09-24 ENCOUNTER — Inpatient Hospital Stay (HOSPITAL_COMMUNITY): Payer: Medicare Other | Admitting: Certified Registered"

## 2018-09-24 ENCOUNTER — Encounter (HOSPITAL_COMMUNITY): Payer: Self-pay | Admitting: *Deleted

## 2018-09-24 ENCOUNTER — Encounter (HOSPITAL_COMMUNITY)
Admission: EM | Disposition: A | Discharge status: Home Health | Payer: Self-pay | Source: Other Acute Inpatient Hospital | Attending: Internal Medicine

## 2018-09-24 DIAGNOSIS — S72042A Displaced fracture of base of neck of left femur, initial encounter for closed fracture: Secondary | ICD-10-CM

## 2018-09-24 DIAGNOSIS — S7290XA Unspecified fracture of unspecified femur, initial encounter for closed fracture: Secondary | ICD-10-CM | POA: Diagnosis present

## 2018-09-24 DIAGNOSIS — S72002A Fracture of unspecified part of neck of left femur, initial encounter for closed fracture: Principal | ICD-10-CM

## 2018-09-24 DIAGNOSIS — S72102A Unspecified trochanteric fracture of left femur, initial encounter for closed fracture: Secondary | ICD-10-CM

## 2018-09-24 DIAGNOSIS — Z01818 Encounter for other preprocedural examination: Secondary | ICD-10-CM

## 2018-09-24 HISTORY — PX: TOTAL HIP ARTHROPLASTY: SHX124

## 2018-09-24 LAB — SURGICAL PCR SCREEN
MRSA, PCR: NEGATIVE
Staphylococcus aureus: NEGATIVE

## 2018-09-24 LAB — SARS CORONAVIRUS 2 (TAT 6-24 HRS): SARS Coronavirus 2: NEGATIVE

## 2018-09-24 SURGERY — ARTHROPLASTY, HIP, TOTAL, ANTERIOR APPROACH
Anesthesia: General | Site: Hip | Laterality: Left

## 2018-09-24 MED ORDER — DEXAMETHASONE SODIUM PHOSPHATE 10 MG/ML IJ SOLN
INTRAMUSCULAR | Status: AC
  Filled 2018-09-24: qty 1

## 2018-09-24 MED ORDER — PROPOFOL 10 MG/ML IV BOLUS
INTRAVENOUS | Status: DC | PRN
  Administered 2018-09-24: 20 mg via INTRAVENOUS
  Administered 2018-09-24: 90 mg via INTRAVENOUS

## 2018-09-24 MED ORDER — ACETAMINOPHEN 10 MG/ML IV SOLN
INTRAVENOUS | Status: AC
  Administered 2018-09-24: 16:00:00 1000 mg via INTRAVENOUS
  Filled 2018-09-24: qty 100

## 2018-09-24 MED ORDER — FENTANYL CITRATE (PF) 100 MCG/2ML IJ SOLN
INTRAMUSCULAR | Status: AC
  Administered 2018-09-24: 25 ug via INTRAVENOUS
  Filled 2018-09-24: qty 2

## 2018-09-24 MED ORDER — CEFAZOLIN SODIUM-DEXTROSE 2-4 GM/100ML-% IV SOLN
INTRAVENOUS | Status: AC
  Filled 2018-09-24: qty 100

## 2018-09-24 MED ORDER — FENTANYL CITRATE (PF) 100 MCG/2ML IJ SOLN
25.0000 ug | INTRAMUSCULAR | Status: DC | PRN
  Administered 2018-09-24 (×4): 25 ug via INTRAVENOUS

## 2018-09-24 MED ORDER — KETOROLAC TROMETHAMINE 15 MG/ML IJ SOLN
INTRAMUSCULAR | Status: AC
  Filled 2018-09-24: qty 1

## 2018-09-24 MED ORDER — ENOXAPARIN SODIUM 40 MG/0.4ML ~~LOC~~ SOLN
40.0000 mg | Freq: Every day | SUBCUTANEOUS | Status: DC
  Administered 2018-09-24 – 2018-09-25 (×2): 40 mg via SUBCUTANEOUS
  Filled 2018-09-24 (×2): qty 0.4

## 2018-09-24 MED ORDER — POLYETHYLENE GLYCOL 3350 17 G PO PACK: 17.0000 g | PACK | Freq: Every day | ORAL | Status: DC | PRN

## 2018-09-24 MED ORDER — BUPIVACAINE IN DEXTROSE 0.75-8.25 % IT SOLN
INTRATHECAL | Status: DC | PRN
  Administered 2018-09-24: 1.8 mL via INTRATHECAL

## 2018-09-24 MED ORDER — METOCLOPRAMIDE HCL 5 MG PO TABS: 5.0000 mg | ORAL_TABLET | Freq: Three times a day (TID) | ORAL | Status: DC | PRN

## 2018-09-24 MED ORDER — ONDANSETRON HCL 4 MG/2ML IJ SOLN
4.0000 mg | Freq: Four times a day (QID) | INTRAMUSCULAR | Status: DC | PRN
  Administered 2018-09-26: 4 mg via INTRAVENOUS
  Filled 2018-09-24: qty 2

## 2018-09-24 MED ORDER — SUGAMMADEX SODIUM 200 MG/2ML IV SOLN
INTRAVENOUS | Status: DC | PRN
  Administered 2018-09-24: 150 mg via INTRAVENOUS

## 2018-09-24 MED ORDER — SODIUM CHLORIDE 0.9 % IV SOLN
INTRAVENOUS | Status: DC
  Administered 2018-09-24 – 2018-09-25 (×2): via INTRAVENOUS

## 2018-09-24 MED ORDER — DOCUSATE SODIUM 100 MG PO CAPS
100.0000 mg | ORAL_CAPSULE | Freq: Two times a day (BID) | ORAL | Status: DC
  Administered 2018-09-24 – 2018-09-26 (×4): 100 mg via ORAL
  Filled 2018-09-24 (×4): qty 1

## 2018-09-24 MED ORDER — PROPOFOL 10 MG/ML IV BOLUS
INTRAVENOUS | Status: AC
  Filled 2018-09-24: qty 60

## 2018-09-24 MED ORDER — SODIUM CHLORIDE 0.9 % IR SOLN
Status: DC | PRN
  Administered 2018-09-24: 1000 mL

## 2018-09-24 MED ORDER — KETOROLAC TROMETHAMINE 15 MG/ML IJ SOLN
15.0000 mg | Freq: Three times a day (TID) | INTRAMUSCULAR | Status: DC | PRN
  Administered 2018-09-24 (×2): 15 mg via INTRAVENOUS
  Filled 2018-09-24 (×2): qty 1

## 2018-09-24 MED ORDER — LACTATED RINGERS IV SOLN
INTRAVENOUS | Status: DC | PRN
  Administered 2018-09-24 (×2): via INTRAVENOUS

## 2018-09-24 MED ORDER — ONDANSETRON HCL 4 MG/2ML IJ SOLN
INTRAMUSCULAR | Status: DC | PRN
  Administered 2018-09-24: 4 mg via INTRAVENOUS

## 2018-09-24 MED ORDER — PROPOFOL 500 MG/50ML IV EMUL
INTRAVENOUS | Status: DC | PRN
  Administered 2018-09-24: 40 ug/kg/min via INTRAVENOUS

## 2018-09-24 MED ORDER — OXYCODONE HCL 5 MG PO TABS
5.0000 mg | ORAL_TABLET | ORAL | Status: DC | PRN
  Administered 2018-09-25 (×2): 5 mg via ORAL
  Administered 2018-09-26 (×2): 10 mg via ORAL
  Administered 2018-09-26: 5 mg via ORAL
  Filled 2018-09-24: qty 1
  Filled 2018-09-24 (×3): qty 2
  Filled 2018-09-24 (×2): qty 1

## 2018-09-24 MED ORDER — PHENOL 1.4 % MT LIQD
1.0000 | OROMUCOSAL | Status: DC | PRN
  Filled 2018-09-24: qty 177

## 2018-09-24 MED ORDER — METHOCARBAMOL 500 MG PO TABS
500.0000 mg | ORAL_TABLET | Freq: Four times a day (QID) | ORAL | Status: DC | PRN
  Administered 2018-09-26 (×2): 500 mg via ORAL
  Filled 2018-09-24 (×3): qty 1

## 2018-09-24 MED ORDER — METOCLOPRAMIDE HCL 5 MG/ML IJ SOLN: 5.0000 mg | Freq: Three times a day (TID) | INTRAMUSCULAR | Status: DC | PRN

## 2018-09-24 MED ORDER — MENTHOL 3 MG MT LOZG: 1.0000 | LOZENGE | OROMUCOSAL | Status: DC | PRN

## 2018-09-24 MED ORDER — FENTANYL CITRATE (PF) 100 MCG/2ML IJ SOLN
INTRAMUSCULAR | Status: DC | PRN
  Administered 2018-09-24: 87.5 ug via INTRAVENOUS
  Administered 2018-09-24: 12.5 ug via INTRAVENOUS

## 2018-09-24 MED ORDER — CEFAZOLIN SODIUM-DEXTROSE 1-4 GM/50ML-% IV SOLN
1.0000 g | Freq: Four times a day (QID) | INTRAVENOUS | Status: AC
  Administered 2018-09-24 – 2018-09-25 (×2): 1 g via INTRAVENOUS
  Filled 2018-09-24 (×2): qty 50

## 2018-09-24 MED ORDER — ONDANSETRON HCL 4 MG/2ML IJ SOLN
INTRAMUSCULAR | Status: AC
  Filled 2018-09-24: qty 2

## 2018-09-24 MED ORDER — LIDOCAINE 2% (20 MG/ML) 5 ML SYRINGE
INTRAMUSCULAR | Status: AC
  Filled 2018-09-24: qty 5

## 2018-09-24 MED ORDER — EPHEDRINE SULFATE-NACL 50-0.9 MG/10ML-% IV SOSY
PREFILLED_SYRINGE | INTRAVENOUS | Status: DC | PRN
  Administered 2018-09-24 (×3): 10 mg via INTRAVENOUS

## 2018-09-24 MED ORDER — METHOCARBAMOL 500 MG IVPB - SIMPLE MED
INTRAVENOUS | Status: AC
  Administered 2018-09-24: 16:00:00 500 mg
  Filled 2018-09-24: qty 50

## 2018-09-24 MED ORDER — ONDANSETRON HCL 4 MG PO TABS: 4.0000 mg | ORAL_TABLET | Freq: Four times a day (QID) | ORAL | Status: DC | PRN

## 2018-09-24 MED ORDER — DEXAMETHASONE SODIUM PHOSPHATE 10 MG/ML IJ SOLN
INTRAMUSCULAR | Status: DC | PRN
  Administered 2018-09-24: 8 mg via INTRAVENOUS

## 2018-09-24 MED ORDER — MIDAZOLAM HCL 2 MG/2ML IJ SOLN
INTRAMUSCULAR | Status: AC
  Filled 2018-09-24: qty 2

## 2018-09-24 MED ORDER — ACETAMINOPHEN 500 MG PO TABS
500.0000 mg | ORAL_TABLET | Freq: Four times a day (QID) | ORAL | Status: DC
  Administered 2018-09-24 – 2018-09-26 (×7): 500 mg via ORAL
  Filled 2018-09-24 (×8): qty 1

## 2018-09-24 MED ORDER — ACETAMINOPHEN 10 MG/ML IV SOLN
1000.0000 mg | Freq: Once | INTRAVENOUS | Status: AC
  Administered 2018-09-24: 16:00:00 1000 mg via INTRAVENOUS

## 2018-09-24 MED ORDER — MIDAZOLAM HCL 2 MG/2ML IJ SOLN
INTRAMUSCULAR | Status: DC | PRN
  Administered 2018-09-24: 1 mg via INTRAVENOUS

## 2018-09-24 MED ORDER — OXYCODONE HCL 5 MG PO TABS
10.0000 mg | ORAL_TABLET | ORAL | Status: DC | PRN
  Administered 2018-09-25: 10 mg via ORAL

## 2018-09-24 MED ORDER — FENTANYL CITRATE (PF) 100 MCG/2ML IJ SOLN
INTRAMUSCULAR | Status: AC
  Filled 2018-09-24: qty 2

## 2018-09-24 MED ORDER — DOCUSATE SODIUM 100 MG PO CAPS: 100.0000 mg | ORAL_CAPSULE | Freq: Two times a day (BID) | ORAL | Status: DC

## 2018-09-24 MED ORDER — ACETAMINOPHEN 325 MG PO TABS: 325.0000 mg | ORAL_TABLET | Freq: Four times a day (QID) | ORAL | Status: DC | PRN

## 2018-09-24 MED ORDER — METHOCARBAMOL 500 MG IVPB - SIMPLE MED
500.0000 mg | Freq: Four times a day (QID) | INTRAVENOUS | Status: DC | PRN
  Filled 2018-09-24: qty 50

## 2018-09-24 MED ORDER — ASPIRIN 81 MG PO CHEW
81.0000 mg | CHEWABLE_TABLET | Freq: Two times a day (BID) | ORAL | Status: DC
  Administered 2018-09-24 – 2018-09-26 (×4): 81 mg via ORAL
  Filled 2018-09-24 (×4): qty 1

## 2018-09-24 MED ORDER — HYDROMORPHONE HCL 1 MG/ML IJ SOLN: 0.5000 mg | INTRAMUSCULAR | Status: DC | PRN

## 2018-09-24 MED ORDER — METHOCARBAMOL 1000 MG/10ML IJ SOLN
500.0000 mg | Freq: Four times a day (QID) | INTRAVENOUS | Status: DC | PRN
  Filled 2018-09-24: qty 5

## 2018-09-24 MED ORDER — PANTOPRAZOLE SODIUM 40 MG PO TBEC
40.0000 mg | DELAYED_RELEASE_TABLET | Freq: Every day | ORAL | Status: DC
  Administered 2018-09-25 – 2018-09-26 (×2): 40 mg via ORAL
  Filled 2018-09-24 (×2): qty 1

## 2018-09-24 MED ORDER — METHOCARBAMOL 500 MG PO TABS
500.0000 mg | ORAL_TABLET | Freq: Four times a day (QID) | ORAL | Status: DC | PRN
  Administered 2018-09-24 – 2018-09-25 (×2): 500 mg via ORAL
  Filled 2018-09-24: qty 1

## 2018-09-24 MED ORDER — TRANEXAMIC ACID-NACL 1000-0.7 MG/100ML-% IV SOLN
INTRAVENOUS | Status: AC
  Filled 2018-09-24: qty 100

## 2018-09-24 MED ORDER — CEFAZOLIN SODIUM-DEXTROSE 2-3 GM-%(50ML) IV SOLR
INTRAVENOUS | Status: DC | PRN
  Administered 2018-09-24: 2 g via INTRAVENOUS

## 2018-09-24 MED ORDER — ROCURONIUM BROMIDE 100 MG/10ML IV SOLN
INTRAVENOUS | Status: DC | PRN
  Administered 2018-09-24: 50 mg via INTRAVENOUS

## 2018-09-24 MED ORDER — DIPHENHYDRAMINE HCL 12.5 MG/5ML PO ELIX: 12.5000 mg | ORAL_SOLUTION | ORAL | Status: DC | PRN

## 2018-09-24 SURGICAL SUPPLY — 41 items
BAG ZIPLOCK 12X15 (MISCELLANEOUS) IMPLANT
BENZOIN TINCTURE PRP APPL 2/3 (GAUZE/BANDAGES/DRESSINGS) IMPLANT
BLADE SAW SGTL 18X1.27X75 (BLADE) ×2 IMPLANT
BLADE SURG SZ10 CARB STEEL (BLADE) ×4 IMPLANT
COVER PERINEAL POST (MISCELLANEOUS) ×2 IMPLANT
COVER SURGICAL LIGHT HANDLE (MISCELLANEOUS) ×2 IMPLANT
COVER WAND RF STERILE (DRAPES) IMPLANT
DRAPE STERI IOBAN 125X83 (DRAPES) ×2 IMPLANT
DRAPE U-SHAPE 47X51 STRL (DRAPES) ×4 IMPLANT
DRESSING AQUACEL AG SP 3.5X10 (GAUZE/BANDAGES/DRESSINGS) ×1 IMPLANT
DRSG AQUACEL AG ADV 3.5X10 (GAUZE/BANDAGES/DRESSINGS) ×2 IMPLANT
DRSG AQUACEL AG SP 3.5X10 (GAUZE/BANDAGES/DRESSINGS) ×2
DURAPREP 26ML APPLICATOR (WOUND CARE) ×2 IMPLANT
ELECT REM PT RETURN 15FT ADLT (MISCELLANEOUS) ×2 IMPLANT
GAUZE XEROFORM 1X8 LF (GAUZE/BANDAGES/DRESSINGS) ×4 IMPLANT
GLOVE BIO SURGEON STRL SZ7.5 (GLOVE) ×2 IMPLANT
GLOVE BIOGEL PI IND STRL 8 (GLOVE) ×2 IMPLANT
GLOVE BIOGEL PI INDICATOR 8 (GLOVE) ×2
GLOVE ECLIPSE 8.0 STRL XLNG CF (GLOVE) ×2 IMPLANT
GOWN STRL REUS W/TWL XL LVL3 (GOWN DISPOSABLE) ×6 IMPLANT
HANDPIECE INTERPULSE COAX TIP (DISPOSABLE) ×1
HEAD M SROM 36MM PLUS 1.5 (Hips) ×1 IMPLANT
HOLDER FOLEY CATH W/STRAP (MISCELLANEOUS) ×2 IMPLANT
KIT TURNOVER KIT A (KITS) ×2 IMPLANT
LINER ACETAB NEUTRAL 36ID 520D (Liner) ×2 IMPLANT
PACK ANTERIOR HIP CUSTOM (KITS) ×2 IMPLANT
PIN SECTOR W/GRIP ACE CUP 52MM (Hips) ×2 IMPLANT
SET HNDPC FAN SPRY TIP SCT (DISPOSABLE) ×1 IMPLANT
SROM M HEAD 36MM PLUS 1.5 (Hips) ×2 IMPLANT
STAPLER VISISTAT 35W (STAPLE) IMPLANT
STEM CORAIL KA13 (Stem) ×2 IMPLANT
STRIP CLOSURE SKIN 1/2X4 (GAUZE/BANDAGES/DRESSINGS) IMPLANT
SUT ETHIBOND NAB CT1 #1 30IN (SUTURE) ×2 IMPLANT
SUT ETHILON 2 0 PS N (SUTURE) IMPLANT
SUT MNCRL AB 4-0 PS2 18 (SUTURE) IMPLANT
SUT VIC AB 0 CT1 36 (SUTURE) ×2 IMPLANT
SUT VIC AB 1 CT1 36 (SUTURE) ×2 IMPLANT
SUT VIC AB 2-0 CT1 27 (SUTURE) ×2
SUT VIC AB 2-0 CT1 TAPERPNT 27 (SUTURE) ×2 IMPLANT
TRAY FOLEY MTR SLVR 14FR STAT (SET/KITS/TRAYS/PACK) ×2 IMPLANT
YANKAUER SUCT BULB TIP 10FT TU (MISCELLANEOUS) ×2 IMPLANT

## 2018-09-24 NOTE — Transfer of Care (Signed)
Immediate Anesthesia Transfer of Care Note  Patient: Latoya Wright  Procedure(s) Performed: TOTAL HIP ARTHROPLASTY ANTERIOR APPROACH (Left Hip)  Patient Location: PACU  Anesthesia Type:General  Level of Consciousness: sedated, patient cooperative and responds to stimulation  Airway & Oxygen Therapy: Patient Spontanous Breathing and Patient connected to face mask oxygen  Post-op Assessment: Report given to RN and Post -op Vital signs reviewed and stable  Post vital signs: Reviewed and stable  Last Vitals:  Vitals Value Taken Time  BP 171/82 09/24/18 1517  Temp    Pulse 95 09/24/18 1518  Resp 19 09/24/18 1518  SpO2 100 % 09/24/18 1518  Vitals shown include unvalidated device data.  Last Pain:  Vitals:   09/24/18 0728  TempSrc:   PainSc: 2          Complications: No apparent anesthesia complications

## 2018-09-24 NOTE — Anesthesia Postprocedure Evaluation (Signed)
Anesthesia Post Note  Patient: Latoya Wright  Procedure(s) Performed: TOTAL HIP ARTHROPLASTY ANTERIOR APPROACH (Left Hip)     Patient location during evaluation: PACU Anesthesia Type: General Level of consciousness: awake and alert Pain management: pain level controlled Vital Signs Assessment: post-procedure vital signs reviewed and stable Respiratory status: spontaneous breathing, nonlabored ventilation, respiratory function stable and patient connected to nasal cannula oxygen Cardiovascular status: blood pressure returned to baseline and stable Postop Assessment: no apparent nausea or vomiting Anesthetic complications: no    Last Vitals:  Vitals:   09/24/18 1600 09/24/18 1601  BP: (!) 149/67   Pulse: 72 70  Resp: 14 11  Temp:    SpO2: 100% 100%                Effie Berkshire

## 2018-09-24 NOTE — Brief Op Note (Signed)
09/24/2018  3:01 PM  PATIENT:  Latoya Wright  73 y.o. female  PRE-OPERATIVE DIAGNOSIS:  left femoral neck fracture  POST-OPERATIVE DIAGNOSIS:  left femoral neck fracture  PROCEDURE:  Procedure(s): TOTAL HIP ARTHROPLASTY ANTERIOR APPROACH (Left)  SURGEON:  Surgeon(s) and Role:    Mcarthur Rossetti, MD - Primary  ANESTHESIA:   spinal and general  EBL:  350 mL   COUNTS:  YES   DICTATION: .Other Dictation: Dictation Number [id number]  PLAN OF CARE: Admit to inpatient   PATIENT DISPOSITION:  PACU - hemodynamically stable.   Delay start of Pharmacological VTE agent (>24hrs) due to surgical blood loss or risk of bleeding: no

## 2018-09-24 NOTE — Progress Notes (Signed)
Pt to pacu in stable condition. No needs at time of transfer.  

## 2018-09-24 NOTE — Plan of Care (Signed)
  Problem: Clinical Measurements: Goal: Cardiovascular complication will be avoided Outcome: Progressing   Problem: Clinical Measurements: Goal: Respiratory complications will improve Outcome: Progressing   Problem: Nutrition: Goal: Adequate nutrition will be maintained Outcome: Progressing   Problem: Coping: Goal: Level of anxiety will decrease Outcome: Progressing   Problem: Elimination: Goal: Will not experience complications related to urinary retention Outcome: Progressing

## 2018-09-24 NOTE — Progress Notes (Addendum)
Text paged on call provider RE: pt unable to void and bladder scan = 467ml. Awaiting new orders  04:55 In and out cath preformed via sterile procedure. 579ml obtained. Pt tolerated well. Witness Alexa Britt NT.

## 2018-09-24 NOTE — Anesthesia Procedure Notes (Addendum)
Spinal  Start time: 09/24/2018 1:27 PM End time: 09/24/2018 1:30 PM Staffing Anesthesiologist: Effie Berkshire, MD Performed: anesthesiologist  Preanesthetic Checklist Completed: patient identified, site marked, surgical consent, pre-op evaluation, timeout performed, IV checked, risks and benefits discussed and monitors and equipment checked Spinal Block Patient position: left lateral decubitus Prep: site prepped and draped and DuraPrep Location: L3-4 Injection technique: single-shot Needle Needle type: Pencan  Needle gauge: 24 G Needle length: 10 cm Needle insertion depth: 10 cm Additional Notes Patient tolerated well. No immediate complications.  Decent CSF flow, minimal aspiration after injection, low threshold for conversion to GA.

## 2018-09-24 NOTE — Consult Note (Signed)
Reason for Consult: Left hip femoral neck fracture Referring Physician: Dawson is an 73 y.o. female.  HPI: The patient is a very pleasant 73 year old active female with no significant past medical history who sustained an accidental mechanical fall yesterday falling off a stepladder.  She landed on her left hip and reported severe hip pain right after that and inability to ambulate.  She had no previous hip pain.  She is quite active and does not use any type of assistive device to get around with.  She is also very healthy.  She was seen at [facility] and found to have a left hip femoral neck fracture.  Orthopedic surgery was consulted and we recommended surgical intervention based off of her x-rays.  She was transferred to the [facility] long hospital late last night.  She is only complaining of left hip pain and denies any other injuries or issues as it relates to her left hip fracture.  She has been n.p.o. as well.  Past Medical History:  Diagnosis Date  . Allergy   . Arthritis     History reviewed. No pertinent surgical history.  Family History  Problem Relation Age of Onset  . Heart disease Father   . Heart disease Maternal Grandmother   . Heart disease Maternal Grandfather   . Heart disease Paternal Grandmother   . Heart disease Paternal Grandfather   . Rheum arthritis Sister   . Rheum arthritis Brother     Social History:  reports that she has never smoked. She has never used smokeless tobacco. She reports that she does not drink alcohol or use drugs.  Allergies:  Allergies  Allergen Reactions  . Dayquil [Pseudoephedrine-Apap-Dm]     jittery    Medications: I have reviewed the patient's current medications.  Results for orders placed or performed during the hospital encounter of 09/23/18 (from the past 48 hour(s))  CBC with Differential     Status: Abnormal   Collection Time: 09/23/18  6:54 PM  Result Value Ref Range   WBC 13.6  (H) 4.0 - 10.5 K/uL   RBC 4.60 3.87 - 5.11 MIL/uL   Hemoglobin 13.1 12.0 - 15.0 g/dL   HCT 41.0 36.0 - 46.0 %   MCV 89.1 80.0 - 100.0 fL   MCH 28.5 26.0 - 34.0 pg   MCHC 32.0 30.0 - 36.0 g/dL   RDW 12.9 11.5 - 15.5 %   Platelets 196 150 - 400 K/uL   nRBC 0.0 0.0 - 0.2 %   Neutrophils Relative % 87 %   Neutro Abs 11.8 (H) 1.7 - 7.7 K/uL   Lymphocytes Relative 7 %   Lymphs Abs 0.9 0.7 - 4.0 K/uL   Monocytes Relative 6 %   Monocytes Absolute 0.8 0.1 - 1.0 K/uL   Eosinophils Relative 0 %   Eosinophils Absolute 0.0 0.0 - 0.5 K/uL   Basophils Relative 0 %   Basophils Absolute 0.0 0.0 - 0.1 K/uL   Immature Granulocytes 0 %   Abs Immature Granulocytes 0.06 0.00 - 0.07 K/uL    Comment: Performed at [facility], [address] [place], [place] [id number]  Basic metabolic panel     Status: Abnormal   Collection Time: 09/23/18  6:54 PM  Result Value Ref Range   Sodium 140 135 - 145 mmol/L   Potassium 4.2 3.5 - 5.1 mmol/L   Chloride 106 98 - 111 mmol/L   CO2 23 22 -  32 mmol/L   Glucose, Bld 118 (H) 70 - 99 mg/dL   BUN 19 8 - 23 mg/dL   Creatinine, Ser 0.88 0.44 - 1.00 mg/dL   Calcium 9.2 8.9 - 10.3 mg/dL   GFR calc non Af Amer >60 >60 mL/min   GFR calc Af Amer >60 >60 mL/min   Anion gap 11 5 - 15    Comment: Performed at [facility], [address] [place], [place] [address]  SARS CORONAVIRUS 2 (TAT 6-12 HRS) Nasal Swab Aptima Multi Swab     Status: None   Collection Time: 09/23/18  7:25 PM   Specimen: Aptima Multi Swab; Nasal Swab  Result Value Ref Range   SARS Coronavirus 2 NEGATIVE NEGATIVE    Comment: (NOTE) SARS-CoV-2 target nucleic acids are NOT DETECTED. The SARS-CoV-2 RNA is generally detectable in upper and lower respiratory specimens during the acute phase of infection. Negative results do not preclude SARS-CoV-2 infection, do not rule out co-infections with other pathogens, and should not be used as the sole basis for treatment or other patient  management decisions. Negative results must be combined with clinical observations, patient history, and epidemiological information. The expected result is Negative. Fact Sheet for Patients: SugarRoll.be Fact Sheet for Healthcare Providers: https://www.woods-mathews.com/ This test is not yet approved or cleared by the [place] FDA and  has been authorized for detection and/or diagnosis of SARS-CoV-2 by FDA under an Emergency Use Authorization (EUA). This EUA will remain  in effect (meaning this test can be used) for the duration of the COVID-19 declaration under Section 56 4(b)(1) of the Act, 21 U.S.C. section 360bbb-3(b)(1), unless the authorization is terminated or revoked sooner. Performed at [facility], [place] [address] [place], [place] [address]   Surgical pcr screen     Status: None   Collection Time: 09/24/18  1:01 AM   Specimen: Nasal Mucosa; Nasal Swab  Result Value Ref Range   MRSA, PCR NEGATIVE NEGATIVE   Staphylococcus aureus NEGATIVE NEGATIVE    Comment: (NOTE) The Xpert SA Assay (FDA approved for NASAL specimens in patients 36 years of age and older), is one component of a comprehensive surveillance program. It is not intended to diagnose infection nor to guide or monitor treatment. Performed at [facility], [place] [address] [place], [place] [address]     Chest Portable 1 View  Result Date: 09/24/2018 CLINICAL DATA:  Preop for femur fracture. EXAM: PORTABLE CHEST 1 VIEW COMPARISON:  01/16/2017 FINDINGS: Hyperinflation. Midline trachea. Normal heart size and mediastinal contours. No pleural effusion or pneumothorax. Biapical pleuroparenchymal scarring - IMPRESSION: No acute cardiopulmonary disease. Hyperinflation with biapical pleuroparenchymal scarring. Electronically Signed   By: Abigail Miyamoto M.D.   On: 09/24/2018 06:15   Dg Hip Unilat With Pelvis 2-3 Views Left  Result Date:  09/23/2018 CLINICAL DATA:  Acute LEFT hip pain following fall today. Initial encounter. EXAM: DG HIP (WITH OR WITHOUT PELVIS) 2-3V LEFT COMPARISON:  None. FINDINGS: A minimally impacted LEFT femoral neck fracture is identified. No evidence of dislocation. No other acute bony abnormality noted. IMPRESSION: Minimally impacted LEFT femoral neck fracture. Electronically Signed   By: Margarette Canada M.D.   On: 09/23/2018 17:38   Independent review of the pelvis and left hip films shows a femoral neck fracture with slight impaction and slight displacement.   Review of Systems  All other systems reviewed and are negative.  Blood pressure 139/64, pulse 75, temperature 98.2 F (36.8 C), temperature source  Oral, resp. rate 16, height 5\' 8"  (1.727 m), weight 48.1 kg, SpO2 100 %. Physical Exam  Constitutional: She is oriented to person, place, and time. She appears well-developed and well-nourished.  HENT:  Head: Normocephalic and atraumatic.  Eyes: Pupils are equal, round, and reactive to light. EOM are normal.  Neck: Normal range of motion. Neck supple.  Cardiovascular: Normal rate.  Respiratory: Effort normal.  GI: Soft.  Musculoskeletal:     Left hip: She exhibits decreased range of motion, decreased strength, tenderness and bony tenderness.  Neurological: She is alert and oriented to person, place, and time.  Skin: Skin is warm and dry.  Psychiatric: She has a normal mood and affect.   On assessing her leg lengths, her left injured side is slightly shorter than her right side.  Assessment/Plan: Left hip with displaced impacted femoral neck fracture  I talked to the patient in detail about treatment options for her left hip.  We discussed nonoperative and operative treatment modalities.  With operative treatment options, we discussed cannulated screw placement versus a total hip arthroplasty.  I talked about the risks and benefits of those procedures.  We collectively decided to pursue a total hip  arthroplasty.  Scrutinizing the x-rays shows just a slight bit of shortening and displacement.  My concern would be that there is a chance this would not heal.  Also we need to have her nonweightbearing for about 4 to 6 weeks after the surgery.  She states that her husband has had hip replacement surgery and is done very well with this procedure and is active as she is as well as needing to take care of her grandchild that she would rather have a hip replacement.  I tend to agree with this as well based on her clinical exam and x-ray findings as well as her high level of function.  The risk and benefits of surgery were explained in detail.  We talked about her interoperative and postoperative course.  She is n.p.o.  Our plan will be to proceed to surgery today for direct anterior total hip arthroplasty.  Mcarthur Rossetti 09/24/2018, 7:27 AM

## 2018-09-24 NOTE — Progress Notes (Signed)
Pt back form pacu in stable condition. Pt sleepy though arousable. Pt left hip incision dressing wnl. Family at bedside.

## 2018-09-24 NOTE — H&P (Signed)
History and Physical    Latoya Wright [id number] DOB: 12-10-1945 DOA: 09/23/2018  PCP: Georgetta Haber, MD (Confirm with patient/family/NH records and if not entered, this has to be entered at [facility] point of entry) Patient coming from: transfer from [facility], to [facility] from home  I have personally briefly reviewed patient's old medical records in [place]  Chief Complaint: Left leg pain after fall  HPI: Latoya Wright is a 73 y.o. female with medical history significant of osteoporosis but otherwise in very good health. She was helping a neighbor in the garden and lost her footing and fell. She had immediate pain and could not bear weight. She went to [facility] for evaluation. Her pain is moderate to severe..  ED Course: X-ray in the ED revealed an impacted left femoral fracture. Dr. Ninfa Linden for ortho was consulted. He will see patient at [facility] to plan treatment: femoral pinning vs hip replacement. Patient was given MS for pain.  Review of Systems: As per HPI otherwise 10 point review of systems negative.    Past Medical History:  Diagnosis Date  . Allergy   . Arthritis     History reviewed. No pertinent surgical history.  Social Hx  - married 29 years (1968), two children, 1 grandchild who lives with her. 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 She lives with her spouse. They split their time between the [place].    reports that she has never smoked. She has never used smokeless tobacco. She reports that she does not drink alcohol or use drugs.  Allergies  Allergen Reactions  . Dayquil [Pseudoephedrine-Apap-Dm]     jittery    Family History  Problem Relation Age of Onset  . Heart disease Father   . Heart disease Maternal Grandmother   . Heart disease Maternal Grandfather   . Heart disease Paternal Grandmother   . Heart disease Paternal Grandfather   . Rheum arthritis Sister   . Rheum arthritis Brother    Unacceptable: Noncontributory, unremarkable,  or negative. Acceptable: Family history reviewed and not pertinent (If you reviewed it)  Prior to Admission medications   Medication Sig Start Date End Date Taking? Authorizing Provider  alendronate (FOSAMAX) 70 MG tablet Take 70 mg by mouth every Monday.  09/21/12  Yes [provider]  Biotin 5 MG TABS Take 5 mg by mouth daily.   Yes [provider]  Calcium-Magnesium-Vitamin D (CALCIUM 500 PO) Take 3 tablets by mouth daily.   Yes [provider]  Magnesium 500 MG TABS Take 1,000 mg by mouth at bedtime.   Yes [provider]  Vitamin D, Cholecalciferol, 10 MCG (400 UNIT) CAPS Take 400 Units by mouth daily.   Yes [provider]    Physical Exam: Vitals:   09/23/18 1923 09/23/18 2209 09/23/18 2336 09/24/18 0020  BP:  (!) 141/69 (!) 147/77 137/65  Pulse:  80 80 68  Resp:  18  14  Temp: 99 F (37.2 C)   98.2 F (36.8 C)  TempSrc: Oral   Oral  SpO2:  97% 99% 94%  Weight:      Height:        Constitutional: NAD, calm, comfortable Vitals:   09/23/18 1923 09/23/18 2209 09/23/18 2336 09/24/18 0020  BP:  (!) 141/69 (!) 147/77 137/65  Pulse:  80 80 68  Resp:  18  14  Temp: 99 F (37.2 C)   98.2 F (36.8 C)  TempSrc: Oral  Oral  SpO2:  97% 99% 94%  Weight:      Height:       General -  A slender woman who looks fit and younger than her stated age in no distress Eyes: PERRL, lids and conjunctivae normal ENMT: Mucous membranes are moist. Posterior pharynx clear of any exudate or lesions.Normal dentition.  Neck: normal, supple, no masses, no thyromegaly Respiratory: clear to auscultation bilaterally, no wheezing, no crackles. Normal respiratory effort. No accessory muscle use.  Cardiovascular: Regular rate and rhythm, no murmurs / rubs / gallops. No extremity edema. 2+ pedal pulses. carotid bruits noted on the left.  Abdomen: no tenderness, no masses palpated. No hepatosplenomegaly. Bowel sounds positive.  Musculoskeletal: no clubbing /  cyanosis. No joint deformity upper and lower extremities. Good ROM except movement of left hip., no contractures. Normal muscle tone.  Skin: no rashes, lesions, ulcers. No induration Neurologic: CN 2-12 grossly intact. Sensation intact, DTR normal. Strength 5/5 in all 4.  Psychiatric: Normal judgment and insight. Alert and oriented x 3. Normal mood.     Labs on Admission: I have personally reviewed following labs and imaging studies  CBC: Recent Labs  Lab 09/23/18 1854  WBC 13.6*  NEUTROABS 11.8*  HGB 13.1  HCT 41.0  MCV 89.1  PLT 123456   Basic Metabolic Panel: Recent Labs  Lab 09/23/18 1854  NA 140  K 4.2  CL 106  CO2 23  GLUCOSE 118*  BUN 19  CREATININE 0.88  CALCIUM 9.2   GFR: Estimated Creatinine Clearance: 43.2 mL/min (by C-G formula based on SCr of 0.88 mg/dL). Liver Function Tests: No results for input(s): AST, ALT, ALKPHOS, BILITOT, PROT, ALBUMIN in the last 168 hours. No results for input(s): LIPASE, AMYLASE in the last 168 hours. No results for input(s): AMMONIA in the last 168 hours. Coagulation Profile: No results for input(s): INR, PROTIME in the last 168 hours. Cardiac Enzymes: No results for input(s): CKTOTAL, CKMB, CKMBINDEX, TROPONINI in the last 168 hours. BNP (last 3 results) No results for input(s): PROBNP in the last 8760 hours. HbA1C: No results for input(s): HGBA1C in the last 72 hours. CBG: No results for input(s): GLUCAP in the last 168 hours. Lipid Profile: No results for input(s): CHOL, HDL, LDLCALC, TRIG, CHOLHDL, LDLDIRECT in the last 72 hours. Thyroid Function Tests: No results for input(s): TSH, T4TOTAL, FREET4, T3FREE, THYROIDAB in the last 72 hours. Anemia Panel: No results for input(s): VITAMINB12, FOLATE, FERRITIN, TIBC, IRON, RETICCTPCT in the last 72 hours. Urine analysis:    Component Value Date/Time   COLORURINE YELLOW 09/07/2017 1812   APPEARANCEUR CLEAR 09/07/2017 1812   LABSPEC 1.020 09/07/2017 1812   PHURINE 6.5  09/07/2017 1812   GLUCOSEU NEGATIVE 09/07/2017 1812   HGBUR NEGATIVE 09/07/2017 1812   BILIRUBINUR NEGATIVE 09/07/2017 1812   BILIRUBINUR Small 05/14/2014 1641   KETONESUR 15 (A) 09/07/2017 1812   PROTEINUR NEGATIVE 09/07/2017 1812   UROBILINOGEN 0.2 05/14/2014 1641   NITRITE NEGATIVE 09/07/2017 1812   LEUKOCYTESUR NEGATIVE 09/07/2017 1812    Radiological Exams on Admission: Dg Hip Unilat With Pelvis 2-3 Views Left  Result Date: 09/23/2018 CLINICAL DATA:  Acute LEFT hip pain following fall today. Initial encounter. EXAM: DG HIP (WITH OR WITHOUT PELVIS) 2-3V LEFT COMPARISON:  None. FINDINGS: A minimally impacted LEFT femoral neck fracture is identified. No evidence of dislocation. No other acute bony abnormality noted. IMPRESSION: Minimally impacted LEFT femoral neck fracture. Electronically Signed   By: Margarette Canada M.D.   On: 09/23/2018 17:38  EKG: Independently reviewed. No EKG at this time to review  Assessment/Plan Active Problems:   Femoral fracture ([place])  (please populate well all problems here in Problem List. (For example, if patient is on BP meds at home and you resume or decide to hold them, it is a problem that needs to be her. Same for CAD, COPD, HLD and so on)   1. Femoral neck fracture - patient with impacted fracture left femoral neck. She has no medical contra-indications for surgical repair. Plan -  EKG and CXR later this AM  Ortho Consult - Dr. Ninfa Linden notified of patient's arrival  DVT prophylaxis: lovenox (Lovenox/Heparin/SCD's/anticoagulated/None (if comfort care) Code Status: full code (Full/Partial (specify details) Family Communication: patient does not want me to call spouse - he has been present with her all the time at [facility] name, relationship. Do not write "discussed with patient". Specify tel # if discussed over the phone) Disposition Plan: Home - 3-4 days (specify when and where you expect patient to be discharged) Consults called: Ortho - Dr.  Ninfa Linden (with names) Admission status: inpatient (inpatient / obs / tele / medical floor / SDU)   Adella Hare MD [facility] Pager [phone]  If 7PM-7AM, please contact night-coverage [email or web address] Password [email or web address]  09/24/2018, 1:15 AM

## 2018-09-24 NOTE — Progress Notes (Signed)
PT Cancellation Note  Patient Details Name: Latoya Wright MRN: [id number] DOB: February 17, 1945   Cancelled Treatment:     PT order received but eval deferred.  Pt with L hip fx and for surgery this date.  Please re-order post-op.   Sandia Pfund 09/24/2018, 9:09 AM

## 2018-09-24 NOTE — Anesthesia Procedure Notes (Signed)
Procedure Name: Intubation Performed by: Gean Maidens, CRNA Pre-anesthesia Checklist: Patient identified, Emergency Drugs available, Suction available, Patient being monitored and Timeout performed Patient Re-evaluated:Patient Re-evaluated prior to induction Oxygen Delivery Method: Circle system utilized Preoxygenation: Pre-oxygenation with 100% oxygen Induction Type: IV induction Ventilation: Mask ventilation without difficulty Laryngoscope Size: Mac and 3 Grade View: Grade I Tube type: Oral Tube size: 7.0 mm Number of attempts: 1 Airway Equipment and Method: Stylet Placement Confirmation: ETT inserted through vocal cords under direct vision,  positive ETCO2 and breath sounds checked- equal and bilateral Secured at: 21 cm Tube secured with: Tape Dental Injury: Teeth and Oropharynx as per pre-operative assessment  Comments: Performed by M. Good CRNA

## 2018-09-24 NOTE — Progress Notes (Signed)
PROGRESS NOTE    Latoya Wright  [id number] DOB: 25-Dec-1945 DOA: 09/23/2018 PCP: Georgetta Haber, MD    Brief Narrative:  72 y.o. female with medical history significant of osteoporosis but otherwise in very good health. She was helping a neighbor in the garden and lost her footing and fell. She had immediate pain and could not bear weight. She went to [facility] for evaluation  ED Course: X-ray in the ED revealed an impacted left femoral fracture. Dr. Ninfa Linden for ortho was consulted. He will see patient at [facility] to plan treatment: femoral pinning vs hip replacement. Patient was given MS for pain.  Assessment & Plan:   Active Problems:   Femoral fracture ([place])   1. Femoral neck fracture -S/p mechanical fall while helping neighbor doing yardwork - patient with impacted fracture left femoral neck.  - Orthopedic Surgery consulted. Plan for surgery today - Pre-op CXR and ekg reviewed. Clear CXR and EKG without ischemic changes - Pt without chest pain, on minmal O2 support and denies SOB. Benefit to surgery would outweigh peri-operative risk at this time  2. Leukocysosis - presenting wbc of 13.6 -Likely secondary to acute fracture -Afebrile -Repeat CBC in AM  3. Elevated BP without HTN -elevated bp likely related to uncontrolled pain from acute fracture -Monitor for now and treat acute pain  DVT prophylaxis: Lovenox subQ Code Status: Full Family Communication: Pt in room, family not at bedside Disposition Plan: Uncertain at this time  Consultants:   Orthopedic Surgery  Procedures:     Antimicrobials: Anti-infectives (From admission, onward)   None       Subjective: Complaining of marked L hip pain when she moves, stable when stay stays still  Objective: Vitals:   09/23/18 2209 09/23/18 2336 09/24/18 0020 09/24/18 0714  BP: (!) 141/69 (!) 147/77 137/65 139/64  Pulse: 80 80 68 75  Resp: 18  14 16   Temp:   98.2 F (36.8 C) 98.2 F (36.8 C)  TempSrc:   Oral Oral   SpO2: 97% 99% 94% 100%  Weight:      Height:        Intake/Output Summary (Last 24 hours) at 09/24/2018 1233 Last data filed at 09/24/2018 0756 Gross per 24 hour  Intake 0 ml  Output 500 ml  Net -500 ml   Filed Weights   09/23/18 1639  Weight: 48.1 kg    Examination:  General exam: Appears calm and comfortable  Respiratory system: Clear to auscultation. Respiratory effort normal. Cardiovascular system: S1 & S2 heard, RRR. Gastrointestinal system: Abdomen is nondistended, soft and nontender. No organomegaly or masses felt. Normal bowel sounds heard. Central nervous system: Alert and oriented. No focal neurological deficits. Extremities: Symmetric 5 x 5 power. Skin: No rashes, lesions Psychiatry: Judgement and insight appear normal. Mood & affect appropriate.   Data Reviewed: I have personally reviewed following labs and imaging studies  CBC: Recent Labs  Lab 09/23/18 1854  WBC 13.6*  NEUTROABS 11.8*  HGB 13.1  HCT 41.0  MCV 89.1  PLT 123456   Basic Metabolic Panel: Recent Labs  Lab 09/23/18 1854  NA 140  K 4.2  CL 106  CO2 23  GLUCOSE 118*  BUN 19  CREATININE 0.88  CALCIUM 9.2   GFR: Estimated Creatinine Clearance: 43.2 mL/min (by C-G formula based on SCr of 0.88 mg/dL). Liver Function Tests: No results for input(s): AST, ALT, ALKPHOS, BILITOT, PROT, ALBUMIN in the last 168 hours. No results for input(s): LIPASE, AMYLASE in the last 168 hours.  No results for input(s): AMMONIA in the last 168 hours. Coagulation Profile: No results for input(s): INR, PROTIME in the last 168 hours. Cardiac Enzymes: No results for input(s): CKTOTAL, CKMB, CKMBINDEX, TROPONINI in the last 168 hours. BNP (last 3 results) No results for input(s): PROBNP in the last 8760 hours. HbA1C: No results for input(s): HGBA1C in the last 72 hours. CBG: No results for input(s): GLUCAP in the last 168 hours. Lipid Profile: No results for input(s): CHOL, HDL, LDLCALC, TRIG, CHOLHDL,  LDLDIRECT in the last 72 hours. Thyroid Function Tests: No results for input(s): TSH, T4TOTAL, FREET4, T3FREE, THYROIDAB in the last 72 hours. Anemia Panel: No results for input(s): VITAMINB12, FOLATE, FERRITIN, TIBC, IRON, RETICCTPCT in the last 72 hours. Sepsis Labs: No results for input(s): PROCALCITON, LATICACIDVEN in the last 168 hours.  Recent Results (from the past 240 hour(s))  SARS CORONAVIRUS 2 (TAT 6-12 HRS) Nasal Swab Aptima Multi Swab     Status: None   Collection Time: 09/23/18  7:25 PM   Specimen: Aptima Multi Swab; Nasal Swab  Result Value Ref Range Status   SARS Coronavirus 2 NEGATIVE NEGATIVE Final    Comment: (NOTE) SARS-CoV-2 target nucleic acids are NOT DETECTED. The SARS-CoV-2 RNA is generally detectable in upper and lower respiratory specimens during the acute phase of infection. Negative results do not preclude SARS-CoV-2 infection, do not rule out co-infections with other pathogens, and should not be used as the sole basis for treatment or other patient management decisions. Negative results must be combined with clinical observations, patient history, and epidemiological information. The expected result is Negative. Fact Sheet for Patients: SugarRoll.be Fact Sheet for Healthcare Providers: https://www.woods-mathews.com/ This test is not yet approved or cleared by the [place] FDA and  has been authorized for detection and/or diagnosis of SARS-CoV-2 by FDA under an Emergency Use Authorization (EUA). This EUA will remain  in effect (meaning this test can be used) for the duration of the COVID-19 declaration under Section 56 4(b)(1) of the Act, 21 U.S.C. section 360bbb-3(b)(1), unless the authorization is terminated or revoked sooner. Performed at [facility], [place] [address], [place], [place] [address]   Surgical pcr screen     Status: None   Collection Time: 09/24/18  1:01 AM   Specimen: Nasal Mucosa;  Nasal Swab  Result Value Ref Range Status   MRSA, PCR NEGATIVE NEGATIVE Final   Staphylococcus aureus NEGATIVE NEGATIVE Final    Comment: (NOTE) The Xpert SA Assay (FDA approved for NASAL specimens in patients 50 years of age and older), is one component of a comprehensive surveillance program. It is not intended to diagnose infection nor to guide or monitor treatment. Performed at [facility], [place] [address] [place], [place] [address]      Radiology Studies: Chest Portable 1 View  Result Date: 09/24/2018 CLINICAL DATA:  Preop for femur fracture. EXAM: PORTABLE CHEST 1 VIEW COMPARISON:  01/16/2017 FINDINGS: Hyperinflation. Midline trachea. Normal heart size and mediastinal contours. No pleural effusion or pneumothorax. Biapical pleuroparenchymal scarring - IMPRESSION: No acute cardiopulmonary disease. Hyperinflation with biapical pleuroparenchymal scarring. Electronically Signed   By: Abigail Miyamoto M.D.   On: 09/24/2018 06:15   Dg Hip Unilat With Pelvis 2-3 Views Left  Result Date: 09/23/2018 CLINICAL DATA:  Acute LEFT hip pain following fall today. Initial encounter. EXAM: DG HIP (WITH OR WITHOUT PELVIS) 2-3V LEFT COMPARISON:  None. FINDINGS: A minimally impacted LEFT femoral neck fracture is identified. No evidence of dislocation. No other acute  bony abnormality noted. IMPRESSION: Minimally impacted LEFT femoral neck fracture. Electronically Signed   By: Margarette Canada M.D.   On: 09/23/2018 17:38    Scheduled Meds: . acetaminophen  500 mg Oral Q6H  . docusate sodium  100 mg Oral BID  . enoxaparin (LOVENOX) injection  40 mg Subcutaneous QHS   Continuous Infusions: . methocarbamol (ROBAXIN) IV       LOS: 1 day   Marylu Lund, MD [facility] Pager On Amion  If 7PM-7AM, please contact night-coverage 09/24/2018, 12:33 PM

## 2018-09-24 NOTE — Anesthesia Preprocedure Evaluation (Addendum)
Anesthesia Evaluation  Patient identified by MRN, date of birth, ID band Patient awake    Reviewed: Allergy & Precautions, NPO status , Patient's Chart, lab work & pertinent test results  Airway Mallampati: I  TM Distance: >3 FB Neck ROM: Full    Dental  (+) Teeth Intact, Dental Advisory Given   Pulmonary neg pulmonary ROS,    breath sounds clear to auscultation       Cardiovascular negative cardio ROS   Rhythm:Regular Rate:Normal     Neuro/Psych negative neurological ROS  negative psych ROS   GI/Hepatic negative GI ROS, Neg liver ROS,   Endo/Other  negative endocrine ROS  Renal/GU negative Renal ROS     Musculoskeletal  (+) Arthritis ,   Abdominal Normal abdominal exam  (+)   Peds  Hematology negative hematology ROS (+)   Anesthesia Other Findings   Reproductive/Obstetrics                            Anesthesia Physical Anesthesia Plan  ASA: III  Anesthesia Plan: Spinal   Post-op Pain Management:    Induction: Intravenous  PONV Risk Score and Plan: 3 and Ondansetron, Dexamethasone, Treatment may vary due to age or medical condition and Propofol infusion  Airway Management Planned: Natural Airway and Simple Face Mask  Additional Equipment: None  Intra-op Plan:   Post-operative Plan:   Informed Consent: I have reviewed the patients History and Physical, chart, labs and discussed the procedure including the risks, benefits and alternatives for the proposed anesthesia with the patient or authorized representative who has indicated his/her understanding and acceptance.       Plan Discussed with: CRNA  Anesthesia Plan Comments:         Anesthesia Quick Evaluation

## 2018-09-25 ENCOUNTER — Encounter (HOSPITAL_COMMUNITY): Payer: Self-pay | Admitting: Orthopaedic Surgery

## 2018-09-25 DIAGNOSIS — Z96642 Presence of left artificial hip joint: Secondary | ICD-10-CM

## 2018-09-25 LAB — CBC
HCT: 34.7 % — ABNORMAL LOW (ref 36.0–46.0)
Hemoglobin: 10.6 g/dL — ABNORMAL LOW (ref 12.0–15.0)
MCH: 28.6 pg (ref 26.0–34.0)
MCHC: 30.5 g/dL (ref 30.0–36.0)
MCV: 93.8 fL (ref 80.0–100.0)
Platelets: 152 10*3/uL (ref 150–400)
RBC: 3.7 MIL/uL — ABNORMAL LOW (ref 3.87–5.11)
RDW: 13 % (ref 11.5–15.5)
WBC: 7.3 10*3/uL (ref 4.0–10.5)
nRBC: 0 % (ref 0.0–0.2)

## 2018-09-25 LAB — BASIC METABOLIC PANEL
Anion gap: 7 (ref 5–15)
BUN: 18 mg/dL (ref 8–23)
CO2: 22 mmol/L (ref 22–32)
Calcium: 8 mg/dL — ABNORMAL LOW (ref 8.9–10.3)
Chloride: 110 mmol/L (ref 98–111)
Creatinine, Ser: 0.8 mg/dL (ref 0.44–1.00)
GFR calc Af Amer: >60 mL/min (ref >60)
GFR calc non Af Amer: >60 mL/min (ref >60)
Glucose, Bld: 120 mg/dL — ABNORMAL HIGH (ref 70–99)
Potassium: 4.9 mmol/L (ref 3.5–5.1)
Sodium: 139 mmol/L (ref 135–145)

## 2018-09-25 MED ORDER — ASPIRIN 81 MG PO CHEW: 81.0000 mg | CHEWABLE_TABLET | Freq: Two times a day (BID) | ORAL | 0 refills | Status: DC

## 2018-09-25 MED ORDER — OXYCODONE HCL 5 MG PO TABS: 5.0000 mg | ORAL_TABLET | ORAL | 0 refills | Status: DC | PRN

## 2018-09-25 NOTE — Progress Notes (Signed)
Subjective: 1 Day Post-Op Procedure(s) (LRB): TOTAL HIP ARTHROPLASTY ANTERIOR APPROACH (Left) Patient reports pain as moderate.    Objective: Vital signs in last 24 hours: Temp:  [97.5 F (36.4 C)-98.8 F (37.1 C)] 98.8 F (37.1 C) (08/31 1030) Pulse Rate:  [68-92] 74 (08/31 1030) Resp:  [9-19] 16 (08/31 1030) BP: (110-161)/(47-113) 110/47 (08/31 1030) SpO2:  [99 %-100 %] 99 % (08/31 1030)  Intake/Output from previous day: 08/30 0701 - 08/31 0700 In: 2761.8 [P.O.:440; I.V.:1971.8; IV Piggyback:350] Out: 1000 [Urine:650; Blood:350] Intake/Output this shift: Total I/O In: -  Out: 400 [Urine:400]  Recent Labs    09/23/18 1854 09/25/18 0303  HGB 13.1 10.6*   Recent Labs    09/23/18 1854 09/25/18 0303  WBC 13.6* 7.3  RBC 4.60 3.70*  HCT 41.0 34.7*  PLT 196 152   Recent Labs    09/23/18 1854 09/25/18 0303  NA 140 139  K 4.2 4.9  CL 106 110  CO2 23 22  BUN 19 18  CREATININE 0.88 0.80  GLUCOSE 118* 120*  CALCIUM 9.2 8.0*   No results for input(s): LABPT, INR in the last 72 hours.  Sensation intact distally Intact pulses distally Dorsiflexion/Plantar flexion intact Incision: dressing C/D/I Compartment soft   Assessment/Plan: 1 Day Post-Op Procedure(s) (LRB): TOTAL HIP ARTHROPLASTY ANTERIOR APPROACH (Left) Up with therapy Plan for discharge tomorrow Discharge home with home health      Mcarthur Rossetti 09/25/2018, 12:28 PM

## 2018-09-25 NOTE — Evaluation (Signed)
Occupational Therapy Evaluation Patient Details Name: Latoya Wright MRN: [id number] DOB: 1945/05/25 Today's Date: 09/25/2018    History of Present Illness Pt is a 73 year old female admitted with left hip displaced femoral neck fracture after falling at home and s/p left direct anterior THA on 09/24/18   Clinical Impression   Pt is s/p THA resulting in the deficits listed below (see OT Problem List).  Pt will benefit from skilled OT to increase their safety and independence with ADL and functional mobility for ADL to facilitate discharge to venue listed below.   Pt will benefit from AE education for LB dressing at pt not able to bend over due pain and pt wants to be mod I       Follow Up Recommendations  No OT follow up    Equipment Recommendations  None recommended by OT    Recommendations for Other Services       Precautions / Restrictions Precautions Precautions: Fall Precaution Comments: no hip precautions Restrictions LLE Weight Bearing: Weight bearing as tolerated      Mobility Bed Mobility Overal bed mobility: Needs Assistance Bed Mobility: Sit to Supine;Supine to Sit     Supine to sit: Min assist Sit to supine: Min assist   General bed mobility comments: assist for L LE  Transfers Overall transfer level: Needs assistance Equipment used: Rolling walker (2 wheeled) Transfers: Sit to/from [facility] Sit to Stand: Min assist         General transfer comment: verbal cues for UE and LE positioning, assist for rise, steady and controlling descent    Balance                                           ADL either performed or assessed with clinical judgement   ADL Overall ADL's : Needs assistance/impaired Eating/Feeding: Sitting;Independent   Grooming: Set up;Sitting   Upper Body Bathing: Set up;Sitting   Lower Body Bathing: Maximal assistance;Sit to/from stand;Cueing for safety;Cueing for sequencing;Cueing for  compensatory techniques   Upper Body Dressing : Set up;Sitting   Lower Body Dressing: Maximal assistance;Sit to/from stand;Cueing for safety;Cueing for sequencing;Cueing for compensatory techniques   Toilet Transfer: Minimal assistance;BSC;RW;Cueing for sequencing;Cueing for safety   Toileting- Clothing Manipulation and Hygiene: Minimal assistance;Sit to/from stand;Cueing for safety;Cueing for sequencing         General ADL Comments: During OT treatment pt very cold. Upon getting back to bed OT obtainted pt a warm blanket     Vision Patient Visual Report: No change from baseline       Perception     Praxis      Pertinent Vitals/Pain Pain Assessment: 0-10 Pain Score: 7  Pain Location: with sitting EOB Pain Descriptors / Indicators: Sore;Discomfort Pain Intervention(s): Limited activity within patient's tolerance;Repositioned(pain lessened when returned to supine)     Hand Dominance     Extremity/Trunk Assessment Upper Extremity Assessment Upper Extremity Assessment: Overall WFL for tasks assessed           Communication Communication Communication: No difficulties   Cognition Arousal/Alertness: Awake/alert Behavior During Therapy: WFL for tasks assessed/performed Overall Cognitive Status: Within Functional Limits for tasks assessed                                     General Comments  Exercises Total Joint Exercises Ankle Circles/Pumps: AROM;Both;10 reps Quad Sets: AROM;Both;10 reps Heel Slides: AAROM;Left;10 reps Hip ABduction/ADduction: AAROM;Left;10 reps   Shoulder Instructions      Home Living Family/patient expects to be discharged to:: Private residence Living Arrangements: Spouse/significant other   Type of Home: Other(Comment)(condo) Home Access: Stairs to enter Entrance Stairs-Number of Steps: 1 Entrance Stairs-Rails: None Home Layout: One level               Home Equipment: None   Additional Comments: staying  at their [place] in [place]; technically live in [facility]      Prior Functioning/Environment Level of Independence: Independent                 OT Problem List: Decreased strength;Pain      OT Treatment/Interventions: Self-care/ADL training;Patient/family education;DME and/or AE instruction;Therapeutic activities    OT Goals(Current goals can be found in the care plan section) Acute Rehab OT Goals Patient Stated Goal: home next day OT Goal Formulation: With patient Time For Goal Achievement: 10/03/18 ADL Goals Pt Will Perform Lower Body Dressing: with modified independence;sit to/from stand Pt Will Transfer to Toilet: with modified independence;regular height toilet;ambulating Pt Will Perform Toileting - Clothing Manipulation and hygiene: with modified independence;sit to/from stand Pt Will Perform Tub/Shower Transfer: Shower transfer;with supervision  OT Frequency: Min 2X/week   Barriers to D/C:            Co-evaluation              AM-PAC OT "6 Clicks" Daily Activity     Outcome Measure Help from another person eating meals?: None Help from another person taking care of personal grooming?: None Help from another person toileting, which includes using toliet, bedpan, or urinal?: A Little Help from another person bathing (including washing, rinsing, drying)?: A Little Help from another person to put on and taking off regular upper body clothing?: None Help from another person to put on and taking off regular lower body clothing?: A Little 6 Click Score: 21   End of Session Equipment Utilized During Treatment: Rolling walker Nurse Communication: Mobility status  Activity Tolerance: Patient tolerated treatment well Patient left: in bed;with call bell/phone within reach  OT Visit Diagnosis: Unsteadiness on feet (R26.81);Muscle weakness (generalized) (M62.81);History of falling (Z91.81)                Time: [id number] OT Time Calculation (min): 14 min Charges:  OT  General Charges $OT Visit: 1 Visit OT Evaluation $OT Eval Low Complexity: 1 Low  Kari Baars, OT Acute Rehabilitation Services [phone] Office- [phone], Edwena Felty D 09/25/2018, 6:34 PM

## 2018-09-25 NOTE — Discharge Instructions (Signed)

## 2018-09-25 NOTE — Evaluation (Signed)
Physical Therapy Evaluation Patient Details Name: Latoya Wright MRN: [id number] DOB: [birth year] Today's Date: 09/25/2018   History of Present Illness  Pt is a 73 year old female admitted with left hip displaced femoral neck fracture after falling at home and s/p left direct anterior THA on 09/24/18  Clinical Impression  Patient is s/p above surgery resulting in functional limitations due to the deficits listed below (see PT Problem List).  Patient will benefit from skilled PT to increase their independence and safety with mobility to allow discharge to the venue listed below.  Pt assisted with ambulating within room and limited by pain.  Pt plans to d/c home with assist from spouse.       Follow Up Recommendations Home health PT;Supervision/Assistance - 24 hour    Equipment Recommendations  Rolling walker with 5" wheels;3in1 (PT)    Recommendations for Other Services       Precautions / Restrictions Precautions Precautions: Fall Precaution Comments: no hip precautions Restrictions LLE Weight Bearing: Weight bearing as tolerated      Mobility  Bed Mobility Overal bed mobility: Needs Assistance Bed Mobility: Supine to Sit     Supine to sit: Min assist     General bed mobility comments: assist for L LE  Transfers Overall transfer level: Needs assistance Equipment used: Rolling walker (2 wheeled) Transfers: Sit to/from Stand Sit to Stand: Min assist         General transfer comment: verbal cues for UE and LE positioning, assist for rise, steady and controlling descent  Ambulation/Gait Ambulation/Gait assistance: Min assist Gait Distance (Feet): 14 Feet Assistive device: Rolling walker (2 wheeled) Gait Pattern/deviations: Step-to pattern;Decreased stance time - left;Decreased weight shift to left;Antalgic Gait velocity: decr   General Gait Details: verbal cues for sequence, RW positioning, step length; pt reports pain limiting distance, encouraged UE weight bearing on  RW  Stairs            Wheelchair Mobility    Modified Rankin (Stroke Patients Only)       Balance                                             Pertinent Vitals/Pain Pain Assessment: 0-10 Pain Score: 6  Pain Location: L hip Pain Descriptors / Indicators: Aching;Sore Pain Intervention(s): Limited activity within patient's tolerance;Monitored during session;Repositioned;Premedicated before session    [place] expects to be discharged to:: Private residence Living Arrangements: Spouse/significant other   Type of Home: Other(Comment)(condo) Home Access: Stairs to enter Entrance Stairs-Rails: None Entrance Stairs-Number of Steps: 1 Home Layout: One level Home Equipment: None Additional Comments: staying at their [place] in [place]; technically live in [facility]    Prior Function Level of Independence: Independent               Hand Dominance        Extremity/Trunk Assessment        Lower Extremity Assessment Lower Extremity Assessment: LLE deficits/detail LLE Deficits / Details: anticipated post op hip weakness and pain; assisted required for bed mobility, able to perform ankle pumps       Communication   Communication: No difficulties  Cognition Arousal/Alertness: Awake/alert Behavior During Therapy: WFL for tasks assessed/performed Overall Cognitive Status: Within Functional Limits for tasks assessed  General Comments      Exercises     Assessment/Plan    PT Assessment Patient needs continued PT services  PT Problem List Decreased strength;Decreased mobility;Decreased activity tolerance;Decreased balance;Decreased knowledge of use of DME;Pain       PT Treatment Interventions DME instruction;Gait training;Balance training;Therapeutic exercise;Functional mobility training;Therapeutic activities;Patient/family education;Stair training    PT Goals (Current  goals can be found in the Care Plan section)  Acute Rehab PT Goals PT Goal Formulation: With patient Time For Goal Achievement: 09/30/18 Potential to Achieve Goals: Good    Frequency Min 5X/week   Barriers to discharge        Co-evaluation               AM-PAC PT "6 Clicks" Mobility  Outcome Measure Help needed turning from your back to your side while in a flat bed without using bedrails?: A Little Help needed moving from lying on your back to sitting on the side of a flat bed without using bedrails?: A Little Help needed moving to and from a bed to a chair (including a wheelchair)?: A Little Help needed standing up from a chair using your arms (e.g., wheelchair or bedside chair)?: A Little Help needed to walk in hospital room?: A Little Help needed climbing 3-5 steps with a railing? : A Lot 6 Click Score: 17    End of Session Equipment Utilized During Treatment: Gait belt Activity Tolerance: Patient tolerated treatment well Patient left: with call bell/phone within reach;in chair;with family/visitor present Nurse Communication: Mobility status PT Visit Diagnosis: Difficulty in walking, not elsewhere classified (R26.2)    Time: [id number] PT Time Calculation (min) (ACUTE ONLY): 27 min   Charges:   PT Evaluation $PT Eval Low Complexity: 1 Low PT Treatments $Gait Training: 8-22 mins       Carmelia Bake, PT, DPT Acute Rehabilitation Services Office: [phone] Pager: Merna E 09/25/2018, 1:30 PM

## 2018-09-25 NOTE — Progress Notes (Signed)
PROGRESS NOTE    Latoya SOLOMONS  [id number] DOB: Dec 19, 1945 DOA: 09/23/2018 PCP: Georgetta Haber, MD    Brief Narrative:  73 y.o. female with medical history significant of osteoporosis but otherwise in very good health. She was helping a neighbor in the garden and lost her footing and fell. She had immediate pain and could not bear weight. She went to [facility] for evaluation  ED Course: X-ray in the ED revealed an impacted left femoral fracture. Dr. Ninfa Linden for ortho was consulted. He will see patient at [facility] to plan treatment: femoral pinning vs hip replacement. Patient was given MS for pain.  Assessment & Plan:   Active Problems:   Femoral fracture ([place])   1. Femoral neck fracture -S/p mechanical fall while helping neighbor doing yardwork - patient with impacted fracture left femoral neck.  - Orthopedic Surgery consulted, now s/p surgery - Therapy recs for home health PT  2. Leukocysosis - presenting wbc of 13.6 -Likely secondary to acute fracture -Afebrile -resolved  3. Elevated BP without HTN -elevated bp likely related to uncontrolled pain from acute fracture -BP now much improved without medications   DVT prophylaxis: Lovenox subQ Code Status: Full Family Communication: Pt in room, family not at bedside Disposition Plan: Uncertain at this time  Consultants:   Orthopedic Surgery  Procedures:   L hip arthoplasty 8/30  Antimicrobials: Anti-infectives (From admission, onward)   Start     Dose/Rate Route Frequency Ordered Stop   09/24/18 1930  ceFAZolin (ANCEF) IVPB 1 g/50 mL premix     1 g 100 mL/hr over 30 Minutes Intravenous Every 6 hours 09/24/18 1636 09/25/18 0135   09/24/18 1338  ceFAZolin (ANCEF) 2-4 GM/100ML-% IVPB    Note to Pharmacy: Gust Rung   : cabinet override      09/24/18 1338 09/25/18 0144      Subjective: Without complaints. Eager to go home soon  Objective: Vitals:   09/25/18 0104 09/25/18 0550 09/25/18 1030 09/25/18 1405   BP: (!) 111/52 (!) 114/50 (!) 110/47 (!) 106/49  Pulse: 72 69 74 73  Resp: 16 17 16 15   Temp: 98 F (36.7 C) 97.7 F (36.5 C) 98.8 F (37.1 C) 98.4 F (36.9 C)  TempSrc:   Oral   SpO2: 100% 100% 99% 100%  Weight:      Height:        Intake/Output Summary (Last 24 hours) at 09/25/2018 1411 Last data filed at 09/25/2018 1300 Gross per 24 hour  Intake 3001.82 ml  Output 1300 ml  Net 1701.82 ml   Filed Weights   09/23/18 1639  Weight: 48.1 kg    Examination: General exam: Awake, laying in bed, in nad Respiratory system: Normal respiratory effort, no wheezing Cardiovascular system: regular rate, s1, s2 Gastrointestinal system: Soft, nondistended, positive BS Central nervous system: CN2-12 grossly intact, strength intact Extremities: Perfused, no clubbing Skin: Normal skin turgor, no notable skin lesions seen Psychiatry: Mood normal // no visual hallucinations   Data Reviewed: I have personally reviewed following labs and imaging studies  CBC: Recent Labs  Lab 09/23/18 1854 09/25/18 0303  WBC 13.6* 7.3  NEUTROABS 11.8*  --   HGB 13.1 10.6*  HCT 41.0 34.7*  MCV 89.1 93.8  PLT 196 0000000   Basic Metabolic Panel: Recent Labs  Lab 09/23/18 1854 09/25/18 0303  NA 140 139  K 4.2 4.9  CL 106 110  CO2 23 22  GLUCOSE 118* 120*  BUN 19 18  CREATININE 0.88 0.80  CALCIUM  9.2 8.0*   GFR: Estimated Creatinine Clearance: 47.6 mL/min (by C-G formula based on SCr of 0.8 mg/dL). Liver Function Tests: No results for input(s): AST, ALT, ALKPHOS, BILITOT, PROT, ALBUMIN in the last 168 hours. No results for input(s): LIPASE, AMYLASE in the last 168 hours. No results for input(s): AMMONIA in the last 168 hours. Coagulation Profile: No results for input(s): INR, PROTIME in the last 168 hours. Cardiac Enzymes: No results for input(s): CKTOTAL, CKMB, CKMBINDEX, TROPONINI in the last 168 hours. BNP (last 3 results) No results for input(s): PROBNP in the last 8760 hours. HbA1C:  No results for input(s): HGBA1C in the last 72 hours. CBG: No results for input(s): GLUCAP in the last 168 hours. Lipid Profile: No results for input(s): CHOL, HDL, LDLCALC, TRIG, CHOLHDL, LDLDIRECT in the last 72 hours. Thyroid Function Tests: No results for input(s): TSH, T4TOTAL, FREET4, T3FREE, THYROIDAB in the last 72 hours. Anemia Panel: No results for input(s): VITAMINB12, FOLATE, FERRITIN, TIBC, IRON, RETICCTPCT in the last 72 hours. Sepsis Labs: No results for input(s): PROCALCITON, LATICACIDVEN in the last 168 hours.  Recent Results (from the past 240 hour(s))  SARS CORONAVIRUS 2 (TAT 6-12 HRS) Nasal Swab Aptima Multi Swab     Status: None   Collection Time: 09/23/18  7:25 PM   Specimen: Aptima Multi Swab; Nasal Swab  Result Value Ref Range Status   SARS Coronavirus 2 NEGATIVE NEGATIVE Final    Comment: (NOTE) SARS-CoV-2 target nucleic acids are NOT DETECTED. The SARS-CoV-2 RNA is generally detectable in upper and lower respiratory specimens during the acute phase of infection. Negative results do not preclude SARS-CoV-2 infection, do not rule out co-infections with other pathogens, and should not be used as the sole basis for treatment or other patient management decisions. Negative results must be combined with clinical observations, patient history, and epidemiological information. The expected result is Negative. Fact Sheet for Patients: SugarRoll.be Fact Sheet for Healthcare Providers: https://www.woods-mathews.com/ This test is not yet approved or cleared by the [place] FDA and  has been authorized for detection and/or diagnosis of SARS-CoV-2 by FDA under an Emergency Use Authorization (EUA). This EUA will remain  in effect (meaning this test can be used) for the duration of the COVID-19 declaration under Section 56 4(b)(1) of the Act, 21 U.S.C. section 360bbb-3(b)(1), unless the authorization is terminated or  revoked sooner. Performed at [facility], [place] [address], [place], [place] [address]   Surgical pcr screen     Status: None   Collection Time: 09/24/18  1:01 AM   Specimen: Nasal Mucosa; Nasal Swab  Result Value Ref Range Status   MRSA, PCR NEGATIVE NEGATIVE Final   Staphylococcus aureus NEGATIVE NEGATIVE Final    Comment: (NOTE) The Xpert SA Assay (FDA approved for NASAL specimens in patients 23 years of age and older), is one component of a comprehensive surveillance program. It is not intended to diagnose infection nor to guide or monitor treatment. Performed at [facility], [place] [address], [place], [place] [address]      Radiology Studies: Dg Pelvis Portable  Result Date: 09/24/2018 CLINICAL DATA:  Left hip replacement EXAM: PORTABLE PELVIS 1-2 VIEWS COMPARISON:  None. FINDINGS: The patient is status post left hip replacement. Surgical staples and soft tissue gas are identified. Hardware is in good position. IMPRESSION: Left hip replacement as above. Electronically Signed   By: Dorise Bullion III M.D   On: 09/24/2018 15:59   Chest Portable 1 View  Result Date:  09/24/2018 CLINICAL DATA:  Preop for femur fracture. EXAM: PORTABLE CHEST 1 VIEW COMPARISON:  01/16/2017 FINDINGS: Hyperinflation. Midline trachea. Normal heart size and mediastinal contours. No pleural effusion or pneumothorax. Biapical pleuroparenchymal scarring - IMPRESSION: No acute cardiopulmonary disease. Hyperinflation with biapical pleuroparenchymal scarring. Electronically Signed   By: Abigail Miyamoto M.D.   On: 09/24/2018 06:15   Dg C-arm 1-60 Min-no Report  Result Date: 09/24/2018 Fluoroscopy was utilized by the requesting physician.  No radiographic interpretation.   Dg Hip Operative Unilat With Pelvis Left  Result Date: 09/24/2018 CLINICAL DATA:  Left hip arthroplasty. EXAM: OPERATIVE LEFT HIP (WITH PELVIS IF PERFORMED) TECHNIQUE: Fluoroscopic spot image(s) were submitted for  interpretation post-operatively. Fluoroscopy time was 24 seconds. COMPARISON:  Left hip x-rays from yesterday. FINDINGS: Intraoperative fluoroscopic images demonstrate interval left total hip arthroplasty. Components are well aligned. No acute osseous abnormality. IMPRESSION: 1. Intraoperative fluoroscopic guidance for left total hip arthroplasty. Electronically Signed   By: Titus Dubin M.D.   On: 09/24/2018 15:02   Dg Hip Unilat With Pelvis 2-3 Views Left  Result Date: 09/23/2018 CLINICAL DATA:  Acute LEFT hip pain following fall today. Initial encounter. EXAM: DG HIP (WITH OR WITHOUT PELVIS) 2-3V LEFT COMPARISON:  None. FINDINGS: A minimally impacted LEFT femoral neck fracture is identified. No evidence of dislocation. No other acute bony abnormality noted. IMPRESSION: Minimally impacted LEFT femoral neck fracture. Electronically Signed   By: Margarette Canada M.D.   On: 09/23/2018 17:38    Scheduled Meds: . acetaminophen  500 mg Oral Q6H  . aspirin  81 mg Oral BID  . docusate sodium  100 mg Oral BID  . enoxaparin (LOVENOX) injection  40 mg Subcutaneous QHS  . pantoprazole  40 mg Oral Daily   Continuous Infusions: . sodium chloride 75 mL/hr at 09/25/18 0801  . methocarbamol (ROBAXIN) IV    . methocarbamol (ROBAXIN) IV       LOS: 2 days   Marylu Lund, MD [facility] Pager On Amion  If 7PM-7AM, please contact night-coverage 09/25/2018, 2:11 PM

## 2018-09-25 NOTE — Op Note (Signed)
NAME: Latoya Wright, Latoya Wright MEDICAL RECORD [id number] ACCOUNT [id number] DATE OF BIRTH:Jun 11, 1945 FACILITY: [facility] LOCATION: [facility] PHYSICIAN:Rosette Bellavance Kerry Fort, MD  OPERATIVE REPORT  DATE OF PROCEDURE:  09/24/2018  PREOPERATIVE DIAGNOSIS:  Left hip displaced femoral neck fracture.  POSTOPERATIVE DIAGNOSIS:  Left hip displaced femoral neck fracture.  PROCEDURE:  Left total hip arthroplasty through direct anterior approach.  IMPLANTS:  DePuy Sector Gription acetabular component size 52, size 36+0 neutral polyethylene liner, size 13 Corail femoral component with standard offset, size 36+1.5 metal hip ball.  SURGEON:  Lind Guest. Ninfa Linden, MD  ANESTHESIA:  General.  ANTIBIOTICS:  Two grams IV Ancef.  ESTIMATED BLOOD LOSS:  350 mL.  COMPLICATIONS:  None.  INDICATIONS:  The patient is a very pleasant and active 73 year old female who sustained a mechanical fall off a step ladder yesterday, injuring her left hip.  She was seen at [facility] and found to have what was felt to be a minimally  displaced left hip femoral neck fracture.  She was transferred to the [facility] and admitted to the [facility] for clearance for surgery.  She now presents for definitive surgery.  I assessed her x-rays and felt like we  could potentially perform just cannulated screw fixation, but I was worried about the osteopenia of her bone as well as the significant pain she was having in her left hip.  On clinical exam lying flat in the bed, she is actually slightly shorter on  that side, so I felt it was more appropriate to proceed with a total hip arthroplasty instead.  I actually told her at length about the 2 treatment options of nonoperative and operative treatment versus also cannulated screws in total hip replacement  surgery.  She opted to proceed with total hip arthroplasty.  We had a long and thorough discussion about the risk of acute blood loss anemia,  nerve and vessel injury, fracture, infection, dislocation, DVT, and implant failure.  We talked about our goals  being decreased pain, improved mobility, and overall improved quality of life.  DESCRIPTION OF PROCEDURE:  After informed consent was obtained and appropriate left hip was marked, she was brought to the operating room and rolled on her side so attempted spinal anesthesia could be obtained.  She was then laid supine on the [place]  fracture table, the perineal post in place, and both legs in in-line skeletal traction with no traction applied.  Her left operative hip was prepped and draped with DuraPrep and sterile drapes.  A time-out was called, and she was identified as correct  patient and correct left hip.  I then tested the skin with pinching, and she obviously could feel that, so general anesthesia was obtained.  We then made an incision just inferior and posterior to the anterior superior iliac spine and carried this  obliquely down the leg.  We dissected down the tensor fascia lata muscle.  The tensor fascia was then divided longitudinally to proceed with direct anterior approach to the hip.  We identified and cauterized circumflex vessels, and then identified the  hip capsule.  Opened up the hip capsule in an L-type format, finding a hemarthrosis consistent with a femoral neck fracture, and you could see the femoral neck fracture.  Then, I certainly felt it was the appropriate surgery based on what we saw from the  fracture.  We placed Cobra retractors around the medial and lateral femoral neck and then made our freshening femoral neck cut just distal to  the fracture and proximal to the lesser trochanter.  We completed this with an osteotome and placed a corkscrew  guide in the femoral head and removed the femoral head in its entirety and then removed remnants of the acetabular labrum and other debris from within the socket itself and placed a bent Hohmann over the medial acetabular rim and  then began reaming  under direct visualization from a size 44 reamer in stepwise increments up to a size 51 with all reamers under direct visualization.  The last reamer was placed under direct fluoroscopy so we could obtain our depth of reaming, our inclination, and  anteversion.  I then placed the real DePuy Sector Gription acetabular component size 50 and a 36+0 neutral polyethylene liner based on the offset we were seeing.  Attention was then turned to the femur.  With the leg externally rotated to 120 degrees,  extended and adducted, we were able to place a Mueller retractor medially and a Hohmann retractor above the greater trochanter.  We released the lateral joint capsule and used a box-cutting osteotome to enter the femoral canal and a rongeur to  lateralize.  We then began broaching using Corail broaching system from a size 8 going up to size 13.  With the 13 in place, we trialed a standard offset neck and a 36+1.5 trial hip ball and reduced this in the acetabulum.  We felt like we had equalized  her leg length, offset, range of motion, and stability.  We dislocated the hip and removed the trial components.  I then placed the real Corail femoral component size 13 with standard offset and the real 36+1.5 metal hip ball.  Again, reduced this in the  acetabulum, and it was felt to be stable.  We then irrigated the soft tissue with normal saline solution using pulsatile lavage.  I was able to close the joint capsule with interrupted #1 Ethibond suture.  #1 Vicryl was used to close the tensor fascia,  0 Vicryl was used to close the deep tissue, 2-0 Vicryl was used to close the subcutaneous tissue, and interrupted staples were used to close the skin.  Xeroform and an Aquacel dressing were applied.  She was then taken off the [facility] table.  I assessed her  leg lengths and found them to be equal.  She was awakened, extubated, and taken to recovery room in stable condition.  All final counts were correct.   There were no complications noted.  LN/NUANCE  D:09/24/2018 T:09/25/2018 JOB:[id number]

## 2018-09-25 NOTE — TOC Initial Note (Signed)
Transition of Care [facility]) - Initial/Assessment Note    Patient Details  Name: Latoya Wright MRN: [id number] Date of Birth: 04-01-1945  Transition of Care [facility] CM/SW Contact:    Lia Hopping, [place] Phone Number: 09/25/2018, 10:54 AM  Clinical Narrative:  Patient admitted for left leg pain after a fall.  Left Hip Femoral neck Fracture Patient in good health prior to fall.         CSW met with patient at bedside to discuss disposition. Patient prefers and agreeable to Home Health. Patient prefers a agency in network with her insurance. CSW provided Medicare compare list. CSW reached out to agencies listed-[facility] agreeable to provide PT services.  CSW made refferal to [employer] for RW and 3 in 1. Equipment will be delivered to the patient room.  Expected Discharge Plan: [place] Barriers to Discharge: No Barriers Identified   Patient Goals and CMS Choice Patient states their goals for this hospitalization and ongoing recovery are:: To return Home CMS Medicare.gov Compare Post Acute Care list provided to:: Patient Choice offered to / list presented to : Patient  Expected Discharge Plan and Services Expected Discharge Plan: [place] In-house Referral: Clinical Social Work Discharge Planning Services: CM Consult Post Acute Care Choice: Home Health                   DME Arranged: 3-N-1, Walker rolling DME Agency: [employer] Date DME Agency Contacted: 09/25/18 Time DME Agency Contacted: [phone] Representative spoke with at DME Agency: [place]: PT [place]: [facility] (formerly [employer]) Date [place]: 09/25/18 Time [place]: 1053 Representative spoke with at [place]: [place] Arrangements/Services   Lives with:: Minor Children, Spouse Patient language and need for interpreter reviewed:: No Do you feel safe going back to the place where you live?: Yes      Need for Family  Participation in Patient Care: No (Comment) Care giver support system in place?: Yes (comment)   Criminal Activity/Legal Involvement Pertinent to Current Situation/Hospitalization: No - Comment as needed  Activities of Daily Living Home Assistive Devices/Equipment: None ADL Screening (condition at time of admission) Patient's cognitive ability adequate to safely complete daily activities?: Yes Is the patient deaf or have difficulty hearing?: No Does the patient have difficulty seeing, even when wearing glasses/contacts?: No Does the patient have difficulty concentrating, remembering, or making decisions?: No Patient able to express need for assistance with ADLs?: Yes Does the patient have difficulty dressing or bathing?: No Independently performs ADLs?: Yes (appropriate for developmental age) Does the patient have difficulty walking or climbing stairs?: No Weakness of Legs: None Weakness of Arms/Hands: None  Permission Sought/Granted Permission sought to share information with : Case Manager, Other (comment) Permission granted to share information with : Yes, Verbal Permission Granted     Permission granted to share info w AGENCY: Home Health/DME Agency  Permission granted to share info w Relationship: Spouse     Emotional Assessment Appearance:: Appears stated age Attitude/Demeanor/Rapport: Engaged Affect (typically observed): Accepting, Pleasant Orientation: : Oriented to Self, Oriented to Place, Oriented to  Time, Oriented to Situation Alcohol / Substance Use: Not Applicable Psych Involvement: No (comment)  Admission diagnosis:  Closed fracture of neck of left femur, initial encounter [facility]) [S72.002A] Patient Active Problem List   Diagnosis Date Noted  . Femoral fracture ([place]) 09/24/2018  . Left displaced femoral neck fracture ([place]) 09/23/2018  . Pulmonary  nodules 04/10/2014  . Thyroid nodule 12/12/2013  . Hemangioma of liver 12/12/2013   PCP:  Georgetta Haber,  MD Pharmacy:   [facility] [address] [place] - [address] [place] [address] Phone: [phone] Fax: [phone]     Social Determinants of Health (SDOH) Interventions    Readmission Risk Interventions No flowsheet data found.

## 2018-09-25 NOTE — Progress Notes (Signed)
Physical Therapy Treatment Patient Details Name: Latoya Wright MRN: [id number] DOB: 1945-06-16 Today's Date: 09/25/2018    History of Present Illness Pt is a 73 year old female admitted with left hip displaced femoral neck fracture after falling at home and s/p left direct anterior THA on 09/24/18    PT Comments    Pt ambulated into hallway short distance and then into bathroom.  Pt provided with cues for mobility to assist with pain control. Pt assisted back to bed end of session and performed a couple LE exercises.  Pt anticipates d/c home tomorrow.    Follow Up Recommendations  Home health PT;Supervision/Assistance - 24 hour     Equipment Recommendations  Rolling walker with 5" wheels;3in1 (PT)    Recommendations for Other Services       Precautions / Restrictions Precautions Precautions: Fall Precaution Comments: no hip precautions Restrictions LLE Weight Bearing: Weight bearing as tolerated    Mobility  Bed Mobility Overal bed mobility: Needs Assistance Bed Mobility: Sit to Supine     Supine to sit: Min assist Sit to supine: Min assist   General bed mobility comments: assist for L LE  Transfers Overall transfer level: Needs assistance Equipment used: Rolling walker (2 wheeled) Transfers: Sit to/from Stand Sit to Stand: Min assist         General transfer comment: verbal cues for UE and LE positioning, assist for rise, steady and controlling descent  Ambulation/Gait Ambulation/Gait assistance: Min assist Gait Distance (Feet): 30 Feet Assistive device: Rolling walker (2 wheeled) Gait Pattern/deviations: Step-to pattern;Decreased stance time - left;Decreased weight shift to left;Antalgic Gait velocity: decr   General Gait Details: verbal cues for sequence, RW positioning, step length; ambulated short distance into hallway and then ambulated to bathroom   Stairs             Wheelchair Mobility    Modified Rankin (Stroke Patients Only)        Balance                                            Cognition Arousal/Alertness: Awake/alert Behavior During Therapy: WFL for tasks assessed/performed Overall Cognitive Status: Within Functional Limits for tasks assessed                                        Exercises Total Joint Exercises Ankle Circles/Pumps: AROM;Both;10 reps Quad Sets: AROM;Both;10 reps Heel Slides: AAROM;Left;10 reps Hip ABduction/ADduction: AAROM;Left;10 reps    General Comments        Pertinent Vitals/Pain Pain Assessment: 0-10 Pain Score: 4  Pain Location: L hip Pain Descriptors / Indicators: Aching;Sore Pain Intervention(s): Monitored during session;Premedicated before session;Repositioned    Home Living                      Prior Function            PT Goals (current goals can now be found in the care plan section) Acute Rehab PT Goals PT Goal Formulation: With patient Time For Goal Achievement: 09/30/18 Potential to Achieve Goals: Good Progress towards PT goals: Progressing toward goals    Frequency    Min 5X/week      PT Plan Current plan remains appropriate    Co-evaluation  AM-PAC PT "6 Clicks" Mobility   Outcome Measure  Help needed turning from your back to your side while in a flat bed without using bedrails?: A Little Help needed moving from lying on your back to sitting on the side of a flat bed without using bedrails?: A Little Help needed moving to and from a bed to a chair (including a wheelchair)?: A Little Help needed standing up from a chair using your arms (e.g., wheelchair or bedside chair)?: A Little Help needed to walk in hospital room?: A Little Help needed climbing 3-5 steps with a railing? : A Lot 6 Click Score: 17    End of Session Equipment Utilized During Treatment: Gait belt Activity Tolerance: Patient tolerated treatment well Patient left: with call bell/phone within reach;with  family/visitor present;with bed alarm set;in bed Nurse Communication: Mobility status PT Visit Diagnosis: Difficulty in walking, not elsewhere classified (R26.2)     Time: [id number] PT Time Calculation (min) (ACUTE ONLY): 32 min  Charges:  $Gait Training: 23-37 mins                     Carmelia Bake, PT, DPT Acute Rehabilitation Services Office: [phone] Pager: [place] E 09/25/2018, 3:13 PM

## 2018-09-26 LAB — CBC
HCT: 30 % — ABNORMAL LOW (ref 36.0–46.0)
Hemoglobin: 9.6 g/dL — ABNORMAL LOW (ref 12.0–15.0)
MCH: 29.5 pg (ref 26.0–34.0)
MCHC: 32 g/dL (ref 30.0–36.0)
MCV: 92.3 fL (ref 80.0–100.0)
Platelets: 127 10*3/uL — ABNORMAL LOW (ref 150–400)
RBC: 3.25 MIL/uL — ABNORMAL LOW (ref 3.87–5.11)
RDW: 13.1 % (ref 11.5–15.5)
WBC: 5.9 10*3/uL (ref 4.0–10.5)
nRBC: 0 % (ref 0.0–0.2)

## 2018-09-26 MED FILL — Fentanyl Citrate Preservative Free (PF) Inj 100 MCG/2ML: INTRAMUSCULAR | Qty: 2 | Status: AC

## 2018-09-26 NOTE — Care Management Important Message (Signed)
Important Message  Patient Details IM Letter given to Kathrin Greathouse SW to present to the Patient Name: Latoya Wright MRN: [id number] Date of Birth: 1945/03/07   Medicare Important Message Given:  Yes     Kerin Salen 09/26/2018, 10:17 AM

## 2018-09-26 NOTE — Progress Notes (Signed)
Physical Therapy Treatment Patient Details Name: Latoya Wright MRN: [id number] DOB: 1945/11/01 Today's Date: 09/26/2018    History of Present Illness Pt is a 73 year old female admitted with left hip displaced femoral neck fracture after falling at home and s/p left direct anterior THA on 09/24/18    PT Comments    Pt with nausea earlier and agreeable to mobilize.  Pt practiced one step with spouse observing.  Pt reviewed standing exercises and safe technique on HEP while therapist demonstrated.  Pt felt unable to tolerate performing at this time as well as discharging home shortly.   Pt had no further questions and feels ready for d/c home today.   Follow Up Recommendations  Home health PT;Supervision/Assistance - 24 hour     Equipment Recommendations  Rolling walker with 5" wheels;3in1 (PT)    Recommendations for Other Services       Precautions / Restrictions Precautions Precautions: Fall Precaution Comments: no hip precautions Restrictions LLE Weight Bearing: Weight bearing as tolerated    Mobility  Bed Mobility Overal bed mobility: Needs Assistance Bed Mobility: Supine to Sit     Supine to sit: Supervision Sit to supine: Supervision   General bed mobility comments: supervision for safety, pt self assisted L LE  Transfers Overall transfer level: Needs assistance Equipment used: Rolling walker (2 wheeled) Transfers: Sit to/from Stand Sit to Stand: Supervision Stand pivot transfers: Supervision       General transfer comment: verbal cues for UE and LE positioning  Ambulation/Gait Ambulation/Gait assistance: Supervision Gait Distance (Feet): 24 Feet Assistive device: Rolling walker (2 wheeled) Gait Pattern/deviations: Step-to pattern;Decreased stance time - left;Decreased weight shift to left;Antalgic Gait velocity: decr   General Gait Details: verbal cues for sequence, RW positioning, step length   Stairs Stairs: Yes Stairs assistance: Min guard Stair  Management: Forwards;With walker;Step to pattern Number of Stairs: 1 General stair comments: verbal cues for sequence, RW positioning, safety; spouse present and observed; pt reports understanding   Wheelchair Mobility    Modified Rankin (Stroke Patients Only)       Balance                                            Cognition Arousal/Alertness: Awake/alert Behavior During Therapy: WFL for tasks assessed/performed Overall Cognitive Status: Within Functional Limits for tasks assessed                                        Exercises      General Comments        Pertinent Vitals/Pain Pain Assessment: 0-10 Pain Score: 5  Pain Location: L hip Pain Descriptors / Indicators: Sore;Discomfort Pain Intervention(s): Monitored during session;Repositioned    Home Living                      Prior Function            PT Goals (current goals can now be found in the care plan section) Progress towards PT goals: Progressing toward goals    Frequency    Min 5X/week      PT Plan Current plan remains appropriate    Co-evaluation              AM-PAC PT "6 Clicks" Mobility  Outcome Measure  Help needed turning from your back to your side while in a flat bed without using bedrails?: A Little Help needed moving from lying on your back to sitting on the side of a flat bed without using bedrails?: A Little Help needed moving to and from a bed to a chair (including a wheelchair)?: A Little Help needed standing up from a chair using your arms (e.g., wheelchair or bedside chair)?: A Little Help needed to walk in hospital room?: A Little Help needed climbing 3-5 steps with a railing? : A Little 6 Click Score: 18    End of Session Equipment Utilized During Treatment: Gait belt Activity Tolerance: Patient tolerated treatment well Patient left: (pt up at sink to brush teeth with spouse assisting)   PT Visit Diagnosis:  Difficulty in walking, not elsewhere classified (R26.2)     Time: [id number] PT Time Calculation (min) (ACUTE ONLY): 20 min  Charges:  $Gait Training: 8-22 mins                    Carmelia Bake, PT, DPT Acute Rehabilitation Services Office: [phone] Pager: [phone]  Trena Platt 09/26/2018, 3:40 PM

## 2018-09-26 NOTE — Progress Notes (Signed)
Occupational Therapy Treatment Patient Details Name: Latoya Wright MRN: [id number] DOB: April 21, 1945 Today's Date: 09/26/2018    History of present illness Pt is a 73 year old female admitted with left hip displaced femoral neck fracture after falling at home and s/p left direct anterior THA on 09/24/18   OT comments  Pt plans to Dc home with husband this day. Pt walked to BR with OT and stated she was hot and nauseous.  Pts BP 161/70.  Pt ask OT to remove food from her room as she smell was terrible.  Pt had not eaten this day. Encouraged pt to eat some crackers. Pt declined.   Follow Up Recommendations  No OT follow up    Equipment Recommendations  None recommended by OT    Recommendations for Other Services      Precautions / Restrictions Precautions Precautions: Fall Precaution Comments: no hip precautions Restrictions LLE Weight Bearing: Weight bearing as tolerated       Mobility Bed Mobility               General bed mobility comments: pt OOB  Transfers Overall transfer level: Needs assistance Equipment used: Rolling walker (2 wheeled) Transfers: Sit to/from [facility] Sit to Stand: Supervision Stand pivot transfers: Supervision       General transfer comment: verbal cues for UE and LE positioning, assist for rise, steady and controlling descent        ADL either performed or assessed with clinical judgement   ADL Overall ADL's : Needs assistance/impaired Eating/Feeding: Independent   Grooming: Standing;Supervision/safety   Upper Body Bathing: Set up;Sitting   Lower Body Bathing: Sit to/from stand;Cueing for safety;Cueing for sequencing;Cueing for compensatory techniques;Minimal assistance;With adaptive equipment Lower Body Bathing Details (indicate cue type and reason): Ae education provided Upper Body Dressing : Set up;Sitting   Lower Body Dressing: Sit to/from stand;Cueing for safety;Cueing for sequencing;Cueing for compensatory  techniques;Minimal assistance;With adaptive equipment   Toilet Transfer: BSC;RW;Cueing for sequencing;Cueing for safety;Supervision/safety   Toileting- Clothing Manipulation and Hygiene: Sit to/from stand;Cueing for safety;Cueing for sequencing;Supervision/safety       Functional mobility during ADLs: Min guard;Rolling walker       Vision Patient Visual Report: No change from baseline            Cognition Arousal/Alertness: Awake/alert Behavior During Therapy: WFL for tasks assessed/performed Overall Cognitive Status: Within Functional Limits for tasks assessed                                                     Pertinent Vitals/ Pain       Pain Score: 3  Pain Location: with walking to bathroom Pain Descriptors / Indicators: Sore;Discomfort Pain Intervention(s): Limited activity within patient's tolerance         Frequency  Min 2X/week        Progress Toward Goals  OT Goals(current goals can now be found in the care plan section)  Progress towards OT goals: Progressing toward goals     Plan Discharge plan remains appropriate       AM-PAC OT "6 Clicks" Daily Activity     Outcome Measure   Help from another person eating meals?: None Help from another person taking care of personal grooming?: None Help from another person toileting, which includes using toliet, bedpan, or urinal?: A Little Help from  another person bathing (including washing, rinsing, drying)?: A Little Help from another person to put on and taking off regular upper body clothing?: None Help from another person to put on and taking off regular lower body clothing?: A Little 6 Click Score: 21    End of Session Equipment Utilized During Treatment: Rolling walker  OT Visit Diagnosis: Unsteadiness on feet (R26.81);Muscle weakness (generalized) (M62.81);History of falling (Z91.81)   Activity Tolerance Patient tolerated treatment well   Patient Left with call bell/phone  within reach;in chair   Nurse Communication Mobility status        Time: [id number] OT Time Calculation (min): 17 min  Charges: OT General Charges $OT Visit: 1 Visit OT Treatments $Self Care/Home Management : 8-22 mins  Kari Baars, [place] [phone] Office- [phone]      Patriciann Becht, Edwena Felty D 09/26/2018, 1:04 PM

## 2018-09-26 NOTE — Discharge Summary (Signed)
Physician Discharge Summary  Latoya Wright [id number] DOB: 06-07-1945 DOA: 09/23/2018  PCP: Georgetta Haber, MD  Admit date: 09/23/2018 Discharge date: 09/26/2018  Admitted From: Home Disposition:  Home  Recommendations for Outpatient Follow-up:  1. Follow up with PCP in 1-2 weeks 2. Follow up with Orthopedic Surgery as scheduled  Home Health:PT  Equipment/Devices:Rolling walker, 3 in 1    Discharge Condition:Improved CODE STATUS:Full Diet recommendation: Regular   Brief/Interim Summary: 73 y.o.femalewith medical history significant ofosteoporosis but otherwise in very good health. She was helping a neighbor in the garden and lost her footing and fell. She had immediate pain and could not bear weight. She went to [facility] for evaluation  ED Course:X-ray in the ED revealed an impacted left femoral fracture. Dr. Ninfa Linden for ortho was consulted. He will see patient at [facility] to plan treatment: femoral pinning vs hip replacement. Patient was given MS for pain.  Discharge Diagnoses:  Active Problems:   Femoral fracture ([facility])  1. Femoral neck fracture -S/p mechanical fall while helping neighbor doing yardwork - patient with impacted fracture left femoral neck.  - Orthopedic Surgery consulted, now s/p surgery - Therapy recs for home health PT  2. Leukocysosis - presenting wbc of 13.6 -Likely secondary to acute fracture -Afebrile -resolved  3. Elevated BP without HTN -elevated bp likely related to uncontrolled pain from acute fracture -BP now much improved without medications  Discharge Instructions   Allergies as of 09/26/2018      Reactions   Dayquil [pseudoephedrine-apap-dm]    jittery      Medication List    TAKE these medications   alendronate 70 MG tablet Commonly known as: FOSAMAX Take 70 mg by mouth every Monday.   aspirin 81 MG chewable tablet Chew 1 tablet (81 mg total) by mouth 2 (two) times daily.   Biotin 5 MG Tabs Take 5 mg by mouth daily.    CALCIUM 500 PO Take 3 tablets by mouth daily.   Magnesium 500 MG Tabs Take 1,000 mg by mouth at bedtime.   oxyCODONE 5 MG immediate release tablet Commonly known as: Oxy IR/ROXICODONE Take 1 tablet (5 mg total) by mouth every 4 (four) hours as needed for moderate pain (pain score 4-6).   Vitamin D (Cholecalciferol) 10 MCG (400 UNIT) Caps Take 400 Units by mouth daily.            Durable Medical Equipment  (From admission, onward)         Start     Ordered   09/24/18 1636  DME Walker rolling  Once    Question:  Patient needs a walker to treat with the following condition  Answer:  Status post total replacement of right hip   09/24/18 1636   09/24/18 1636  DME 3 n 1  Once     09/24/18 1636         Follow-up Information    Mcarthur Rossetti, MD. Schedule an appointment as soon as possible for a visit in 2 week(s).   Specialty: Orthopedic Surgery Contact information: [address] [place] [address] [phone]          Allergies  Allergen Reactions  . Dayquil [Pseudoephedrine-Apap-Dm]     jittery    Consultations:  Orthopedic Surgery  Procedures/Studies: Dg Pelvis Portable  Result Date: 09/24/2018 CLINICAL DATA:  Left hip replacement EXAM: PORTABLE PELVIS 1-2 VIEWS COMPARISON:  None. FINDINGS: The patient is status post left hip replacement. Surgical staples and soft tissue gas are identified. Hardware  is in good position. IMPRESSION: Left hip replacement as above. Electronically Signed   By: Dorise Bullion III M.D   On: 09/24/2018 15:59   Chest Portable 1 View  Result Date: 09/24/2018 CLINICAL DATA:  Preop for femur fracture. EXAM: PORTABLE CHEST 1 VIEW COMPARISON:  01/16/2017 FINDINGS: Hyperinflation. Midline trachea. Normal heart size and mediastinal contours. No pleural effusion or pneumothorax. Biapical pleuroparenchymal scarring - IMPRESSION: No acute cardiopulmonary disease. Hyperinflation with biapical pleuroparenchymal  scarring. Electronically Signed   By: Abigail Miyamoto M.D.   On: 09/24/2018 06:15   Dg C-arm 1-60 Min-no Report  Result Date: 09/24/2018 CLINICAL DATA:  Left hip arthroplasty. EXAM: OPERATIVE LEFT HIP (WITH PELVIS IF PERFORMED) TECHNIQUE: Fluoroscopic spot image(s) were submitted for interpretation post-operatively. Fluoroscopy time was 24 seconds. COMPARISON:  Left hip x-rays from yesterday. FINDINGS: Intraoperative fluoroscopic images demonstrate interval left total hip arthroplasty. Components are well aligned. No acute osseous abnormality. IMPRESSION: 1. Intraoperative fluoroscopic guidance for left total hip arthroplasty. Electronically Signed   By: Titus Dubin M.D.   On: 09/24/2018 15:02   Dg Hip Operative Unilat With Pelvis Left  Result Date: 09/24/2018 CLINICAL DATA:  Left hip arthroplasty. EXAM: OPERATIVE LEFT HIP (WITH PELVIS IF PERFORMED) TECHNIQUE: Fluoroscopic spot image(s) were submitted for interpretation post-operatively. Fluoroscopy time was 24 seconds. COMPARISON:  Left hip x-rays from yesterday. FINDINGS: Intraoperative fluoroscopic images demonstrate interval left total hip arthroplasty. Components are well aligned. No acute osseous abnormality. IMPRESSION: 1. Intraoperative fluoroscopic guidance for left total hip arthroplasty. Electronically Signed   By: Titus Dubin M.D.   On: 09/24/2018 15:02   Dg Hip Unilat With Pelvis 2-3 Views Left  Result Date: 09/23/2018 CLINICAL DATA:  Acute LEFT hip pain following fall today. Initial encounter. EXAM: DG HIP (WITH OR WITHOUT PELVIS) 2-3V LEFT COMPARISON:  None. FINDINGS: A minimally impacted LEFT femoral neck fracture is identified. No evidence of dislocation. No other acute bony abnormality noted. IMPRESSION: Minimally impacted LEFT femoral neck fracture. Electronically Signed   By: Margarette Canada M.D.   On: 09/23/2018 17:38    Subjective: Eager to go home  Discharge Exam: Vitals:   09/25/18 2052 09/26/18 0446  BP: (!) 123/56 (!)  121/55  Pulse: 82 74  Resp: 18 18  Temp: 99.1 F (37.3 C) 98.3 F (36.8 C)  SpO2: 100% 98%   Vitals:   09/25/18 1030 09/25/18 1405 09/25/18 2052 09/26/18 0446  BP: (!) 110/47 (!) 106/49 (!) 123/56 (!) 121/55  Pulse: 74 73 82 74  Resp: 16 15 18 18   Temp: 98.8 F (37.1 C) 98.4 F (36.9 C) 99.1 F (37.3 C) 98.3 F (36.8 C)  TempSrc: Oral     SpO2: 99% 100% 100% 98%  Weight:      Height:        General: Pt is alert, awake, not in acute distress Cardiovascular: RRR, S1/S2 +, no rubs, no gallops Respiratory: CTA bilaterally, no wheezing, no rhonchi Abdominal: Soft, NT, ND, bowel sounds + Extremities: no edema, no cyanosis   The results of significant diagnostics from this hospitalization (including imaging, microbiology, ancillary and laboratory) are listed below for reference.     Microbiology: Recent Results (from the past 240 hour(s))  SARS CORONAVIRUS 2 (TAT 6-12 HRS) Nasal Swab Aptima Multi Swab     Status: None   Collection Time: 09/23/18  7:25 PM   Specimen: Aptima Multi Swab; Nasal Swab  Result Value Ref Range Status   SARS Coronavirus 2 NEGATIVE NEGATIVE Final    Comment: (NOTE)  SARS-CoV-2 target nucleic acids are NOT DETECTED. The SARS-CoV-2 RNA is generally detectable in upper and lower respiratory specimens during the acute phase of infection. Negative results do not preclude SARS-CoV-2 infection, do not rule out co-infections with other pathogens, and should not be used as the sole basis for treatment or other patient management decisions. Negative results must be combined with clinical observations, patient history, and epidemiological information. The expected result is Negative. Fact Sheet for Patients: SugarRoll.be Fact Sheet for Healthcare Providers: https://www.woods-mathews.com/ This test is not yet approved or cleared by the [place] FDA and  has been authorized for detection and/or diagnosis of  SARS-CoV-2 by FDA under an Emergency Use Authorization (EUA). This EUA will remain  in effect (meaning this test can be used) for the duration of the COVID-19 declaration under Section 56 4(b)(1) of the Act, 21 U.S.C. section 360bbb-3(b)(1), unless the authorization is terminated or revoked sooner. Performed at [facility], [place] [address] [place], [place] [address]   Surgical pcr screen     Status: None   Collection Time: 09/24/18  1:01 AM   Specimen: Nasal Mucosa; Nasal Swab  Result Value Ref Range Status   MRSA, PCR NEGATIVE NEGATIVE Final   Staphylococcus aureus NEGATIVE NEGATIVE Final    Comment: (NOTE) The Xpert SA Assay (FDA approved for NASAL specimens in patients 11 years of age and older), is one component of a comprehensive surveillance program. It is not intended to diagnose infection nor to guide or monitor treatment. Performed at [facility], [place] [address], [place], [place] [address]      Labs: BNP (last 3 results) No results for input(s): BNP in the last 8760 hours. Basic Metabolic Panel: Recent Labs  Lab 09/23/18 1854 09/25/18 0303  NA 140 139  K 4.2 4.9  CL 106 110  CO2 23 22  GLUCOSE 118* 120*  BUN 19 18  CREATININE 0.88 0.80  CALCIUM 9.2 8.0*   Liver Function Tests: No results for input(s): AST, ALT, ALKPHOS, BILITOT, PROT, ALBUMIN in the last 168 hours. No results for input(s): LIPASE, AMYLASE in the last 168 hours. No results for input(s): AMMONIA in the last 168 hours. CBC: Recent Labs  Lab 09/23/18 1854 09/25/18 0303 09/26/18 0329  WBC 13.6* 7.3 5.9  NEUTROABS 11.8*  --   --   HGB 13.1 10.6* 9.6*  HCT 41.0 34.7* 30.0*  MCV 89.1 93.8 92.3  PLT 196 152 127*   Cardiac Enzymes: No results for input(s): CKTOTAL, CKMB, CKMBINDEX, TROPONINI in the last 168 hours. BNP: Invalid input(s): POCBNP CBG: No results for input(s): GLUCAP in the last 168 hours. D-Dimer No results for input(s): DDIMER in the last 72  hours. Hgb A1c No results for input(s): HGBA1C in the last 72 hours. Lipid Profile No results for input(s): CHOL, HDL, LDLCALC, TRIG, CHOLHDL, LDLDIRECT in the last 72 hours. Thyroid function studies No results for input(s): TSH, T4TOTAL, T3FREE, THYROIDAB in the last 72 hours.  Invalid input(s): FREET3 Anemia work up No results for input(s): VITAMINB12, FOLATE, FERRITIN, TIBC, IRON, RETICCTPCT in the last 72 hours. Urinalysis    Component Value Date/Time   COLORURINE YELLOW 09/07/2017 1812   APPEARANCEUR CLEAR 09/07/2017 1812   LABSPEC 1.020 09/07/2017 1812   PHURINE 6.5 09/07/2017 1812   GLUCOSEU NEGATIVE 09/07/2017 1812   HGBUR NEGATIVE 09/07/2017 1812   BILIRUBINUR NEGATIVE 09/07/2017 1812   BILIRUBINUR Small 05/14/2014 1641   KETONESUR 15 (A) 09/07/2017 1812   PROTEINUR NEGATIVE 09/07/2017 1812  UROBILINOGEN 0.2 05/14/2014 1641   NITRITE NEGATIVE 09/07/2017 1812   LEUKOCYTESUR NEGATIVE 09/07/2017 1812   Sepsis Labs Invalid input(s): PROCALCITONIN,  WBC,  LACTICIDVEN Microbiology Recent Results (from the past 240 hour(s))  SARS CORONAVIRUS 2 (TAT 6-12 HRS) Nasal Swab Aptima Multi Swab     Status: None   Collection Time: 09/23/18  7:25 PM   Specimen: Aptima Multi Swab; Nasal Swab  Result Value Ref Range Status   SARS Coronavirus 2 NEGATIVE NEGATIVE Final    Comment: (NOTE) SARS-CoV-2 target nucleic acids are NOT DETECTED. The SARS-CoV-2 RNA is generally detectable in upper and lower respiratory specimens during the acute phase of infection. Negative results do not preclude SARS-CoV-2 infection, do not rule out co-infections with other pathogens, and should not be used as the sole basis for treatment or other patient management decisions. Negative results must be combined with clinical observations, patient history, and epidemiological information. The expected result is Negative. Fact Sheet for Patients: SugarRoll.be Fact Sheet for  Healthcare Providers: https://www.woods-mathews.com/ This test is not yet approved or cleared by the [place] FDA and  has been authorized for detection and/or diagnosis of SARS-CoV-2 by FDA under an Emergency Use Authorization (EUA). This EUA will remain  in effect (meaning this test can be used) for the duration of the COVID-19 declaration under Section 56 4(b)(1) of the Act, 21 U.S.C. section 360bbb-3(b)(1), unless the authorization is terminated or revoked sooner. Performed at [facility], [place] [address] [place], [place] [address]   Surgical pcr screen     Status: None   Collection Time: 09/24/18  1:01 AM   Specimen: Nasal Mucosa; Nasal Swab  Result Value Ref Range Status   MRSA, PCR NEGATIVE NEGATIVE Final   Staphylococcus aureus NEGATIVE NEGATIVE Final    Comment: (NOTE) The Xpert SA Assay (FDA approved for NASAL specimens in patients 50 years of age and older), is one component of a comprehensive surveillance program. It is not intended to diagnose infection nor to guide or monitor treatment. Performed at [facility], [place] [address] [place], [place] [address]    Time spent: 63min  SIGNED:   Marylu Lund, MD  [facility] 09/26/2018, 9:05 AM  If 7PM-7AM, please contact night-coverage

## 2018-09-26 NOTE — Progress Notes (Signed)
Physical Therapy Treatment Patient Details Name: Latoya Wright MRN: [id number] DOB: October 17, 1945 Today's Date: 09/26/2018    History of Present Illness Pt is a 73 year old female admitted with left hip displaced femoral neck fracture after falling at home and s/p left direct anterior THA on 09/24/18    PT Comments    Pt assisted with ambulating short distance in hallway.  Reviewed HEP verbally with pt.  Pt did not feel able to perform exercises this morning however recalls exercises performed yesterday well.  Will return to practice one step with pt.    Follow Up Recommendations  Home health PT;Supervision/Assistance - 24 hour     Equipment Recommendations  Rolling walker with 5" wheels;3in1 (PT)    Recommendations for Other Services       Precautions / Restrictions Precautions Precautions: Fall Precaution Comments: no hip precautions Restrictions LLE Weight Bearing: Weight bearing as tolerated    Mobility  Bed Mobility Overal bed mobility: Needs Assistance Bed Mobility: Supine to Sit;Sit to Supine     Supine to sit: Supervision Sit to supine: Supervision   General bed mobility comments: supervision for safety, pt self assisted L LE  Transfers Overall transfer level: Needs assistance Equipment used: Rolling walker (2 wheeled) Transfers: Sit to/from Stand Sit to Stand: Supervision Stand pivot transfers: Supervision       General transfer comment: verbal cues for UE and LE positioning  Ambulation/Gait Ambulation/Gait assistance: Min guard;Supervision Gait Distance (Feet): 70 Feet Assistive device: Rolling walker (2 wheeled) Gait Pattern/deviations: Step-to pattern;Decreased stance time - left;Decreased weight shift to left;Antalgic Gait velocity: decr   General Gait Details: verbal cues for sequence, RW positioning, step length   Stairs             Wheelchair Mobility    Modified Rankin (Stroke Patients Only)       Balance                                             Cognition Arousal/Alertness: Awake/alert Behavior During Therapy: WFL for tasks assessed/performed Overall Cognitive Status: Within Functional Limits for tasks assessed                                        Exercises      General Comments        Pertinent Vitals/Pain Pain Assessment: 0-10 Pain Score: 4  Pain Location: L hip Pain Descriptors / Indicators: Sore;Discomfort Pain Intervention(s): Limited activity within patient's tolerance;Monitored during session;Repositioned    Home Living                      Prior Function            PT Goals (current goals can now be found in the care plan section) Progress towards PT goals: Progressing toward goals    Frequency    Min 5X/week      PT Plan Current plan remains appropriate    Co-evaluation              AM-PAC PT "6 Clicks" Mobility   Outcome Measure  Help needed turning from your back to your side while in a flat bed without using bedrails?: A Little Help needed moving from lying on your back to sitting on the  side of a flat bed without using bedrails?: A Little Help needed moving to and from a bed to a chair (including a wheelchair)?: A Little Help needed standing up from a chair using your arms (e.g., wheelchair or bedside chair)?: A Little Help needed to walk in hospital room?: A Little Help needed climbing 3-5 steps with a railing? : A Lot 6 Click Score: 17    End of Session Equipment Utilized During Treatment: Gait belt Activity Tolerance: Patient tolerated treatment well Patient left: with call bell/phone within reach;in bed   PT Visit Diagnosis: Difficulty in walking, not elsewhere classified (R26.2)     Time: [id number] PT Time Calculation (min) (ACUTE ONLY): 18 min  Charges:  $Gait Training: 8-22 mins                    Carmelia Bake, PT, DPT Acute Rehabilitation Services Office: [phone] Pager:  [phone]  Trena Platt 09/26/2018, 3:36 PM

## 2018-10-09 ENCOUNTER — Encounter: Payer: Self-pay | Admitting: Orthopaedic Surgery

## 2018-10-09 ENCOUNTER — Ambulatory Visit (INDEPENDENT_AMBULATORY_CARE_PROVIDER_SITE_OTHER): Payer: Medicare Other | Admitting: Orthopaedic Surgery

## 2018-10-09 DIAGNOSIS — Z96642 Presence of left artificial hip joint: Secondary | ICD-10-CM

## 2018-10-09 NOTE — Progress Notes (Signed)
The patient is 15 days status post a left total hip arthroplasty to treat her left hip displaced femoral neck fracture.  She is ambulating with a rolling walker.  She does report swelling in her left foot on her operative side but denies any calf pain.  She has been wearing compressive hose but did not put them on today.  She is been on aspirin twice a day as well.  She gets up on exam table easily.  We removed her staples in place Steri-Strips.  Her ligaments are equal.  Her calf is soft.  She does hurt on her shin.  Her foot is swollen on the left side.  Again given her soft calf I will not order a Doppler but I will have her continue wear compressive garments and continue her aspirin for lesion of the week.  We will see her back in 4 weeks to see how she is doing overall.  All questions concerns were answered and addressed.  She will continue creaser activities as comfort allows.

## 2018-10-12 ENCOUNTER — Other Ambulatory Visit: Payer: Self-pay | Admitting: Orthopaedic Surgery

## 2018-10-12 ENCOUNTER — Telehealth: Payer: Self-pay | Admitting: Orthopaedic Surgery

## 2018-10-12 MED ORDER — OXYCODONE HCL 5 MG PO TABS: 5.0000 mg | ORAL_TABLET | Freq: Four times a day (QID) | ORAL | 0 refills | Status: DC | PRN

## 2018-10-12 NOTE — Telephone Encounter (Signed)
Please advise 

## 2018-10-12 NOTE — Telephone Encounter (Signed)
Patient called. Would like some pain meds called in. Her call back number is [phone]

## 2018-10-31 ENCOUNTER — Telehealth: Payer: Self-pay | Admitting: Orthopaedic Surgery

## 2018-10-31 NOTE — Telephone Encounter (Signed)
Patient called. She would like for Caryl Pina to call her. No reason given. Her call back number is [phone]

## 2018-11-01 NOTE — Telephone Encounter (Signed)
Patient aware we will discuss at her next appt

## 2018-11-02 ENCOUNTER — Ambulatory Visit: Payer: Self-pay

## 2018-11-02 ENCOUNTER — Other Ambulatory Visit: Payer: Self-pay

## 2018-11-02 ENCOUNTER — Ambulatory Visit (INDEPENDENT_AMBULATORY_CARE_PROVIDER_SITE_OTHER): Payer: Medicare Other | Admitting: Family Medicine

## 2018-11-02 ENCOUNTER — Encounter: Payer: Self-pay | Admitting: Family Medicine

## 2018-11-02 VITALS — BP 118/62 | HR 71 | Temp 98.1°F | Ht 68.0 in | Wt 110.4 lb

## 2018-11-02 DIAGNOSIS — H6983 Other specified disorders of Eustachian tube, bilateral: Secondary | ICD-10-CM | POA: Diagnosis not present

## 2018-11-02 MED ORDER — AZELASTINE HCL 0.1 % NA SOLN: 2.0000 | Freq: Two times a day (BID) | NASAL | 12 refills | Status: DC

## 2018-11-02 MED ORDER — FLUTICASONE PROPIONATE 50 MCG/ACT NA SUSP: 2.0000 | Freq: Every day | NASAL | 6 refills | Status: DC

## 2018-11-02 NOTE — Telephone Encounter (Signed)
Pt. Reports she had a fall on 09/23/18 and had a hip replacement due to injury. Has noticed since her fall hearing loss to her right ear. No pain or ringing in her ear. Instructed to call her PCP for recommendation.Verbalizes understanding. Reason for Disposition . [1] Decreased hearing in 1 ear AND [2] gradual onset  Answer Assessment - Initial Assessment Questions 1. DESCRIPTION: "What type of hearing problem are you having? Describe it for me." (e.g., complete hearing loss, partial loss)     Partial loss 2. LOCATION: "One or both ears?" If one, ask: "Which ear?"     Right ear 3. SEVERITY: "Can you hear anything?" If so, ask: "What can you hear?" (e.g., ticking watch, whisper, talking)     Faint hearing on the phone. Worse in person 4. ONSET: "When did this begin?" "Did it start suddenly or come on gradually?"     3 weeks ago but getting worse 5. PATTERN: "Does this come and go, or has it been constant since it started?"     Constant 6. PAIN: "Is there any pain in your ear(s)?"  (Scale 1-10; or mild, moderate, severe)     No 7. CAUSE: "What do you think is causing this hearing problem?"     Maybe from her fall on 09/23/18 8. OTHER SYMPTOMS: "Do you have any other symptoms?" (e.g., dizziness, ringing in ears)     No 9. PREGNANCY: "Is there any chance you are pregnant?" "When was your last menstrual period?"     No  Protocols used: HEARING LOSS OR CHANGE-A-AH

## 2018-11-02 NOTE — Progress Notes (Signed)
   Chief Complaint:  Latoya Wright is a 73 y.o. female who presents today with a chief complaint of decreased hearing.  Assessment/Plan:  Eustachian tube dysfunction Will start Flonase and Astelin.  Given recent hip fracture, will avoid prednisone at this time.  Given her hearing loss, if her symptoms not improve in the next 5 to 7 days, would consider referral to ENT.  Discussed reasons to return to care and seek emergent care    Subjective:  HPI:  Hearing Loss Located in right ear.  Started out a week ago.  No associated pain.  No obvious injuries or precipitating events.  She fell about a month ago and fractured her hip.  She is not sure if this caused any issues.  Does not have any headaches.  No vision changes.  No weakness or numbness.  No specific treatments tried.  No obvious aggravating or alleviating factors.  She has had a mild runny nose for a few days.    ROS: Per HPI  PMH: She reports that she has never smoked. She has never used smokeless tobacco. She reports that she does not drink alcohol or use drugs.      Objective:  Physical Exam: BP 118/62   Pulse 71   Temp 98.1 F (36.7 C)   Ht 5\' 8"  (1.727 m)   Wt 110 lb 6.1 oz (50.1 kg)   SpO2 99%   BMI 16.78 kg/m   Gen: NAD, resting comfortably HEENT: Bilateral ears with middle ear effusion.      Algis Greenhouse. Jerline Pain, MD 11/02/2018 3:06 PM

## 2018-11-02 NOTE — Patient Instructions (Signed)
It was very nice to see you today!  Please start the 2 nasal sprays.  I think you have fluid buildup behind your ears.  This is most likely causing your hearing issues.  Let me know if your symptoms worsen or do not improve the next 5 to 7 days and we can refer you to see an ear nose and throat specialist.  Take care, Dr Jerline Pain  Please try these tips to maintain a healthy lifestyle:   Eat at least 3 REAL meals and 1-2 snacks per day.  Aim for no more than 5 hours between eating.  If you eat breakfast, please do so within one hour of getting up.    Obtain twice as many fruits/vegetables as protein or carbohydrate foods for both lunch and dinner. (Half of each meal should be fruits/vegetables, one quarter protein, and one quarter starchy carbs)   Cut down on sweet beverages. This includes juice, soda, and sweet tea.    Exercise at least 150 minutes every week.

## 2018-11-06 ENCOUNTER — Ambulatory Visit (INDEPENDENT_AMBULATORY_CARE_PROVIDER_SITE_OTHER): Payer: Medicare Other | Admitting: Orthopaedic Surgery

## 2018-11-06 ENCOUNTER — Encounter: Payer: Self-pay | Admitting: Orthopaedic Surgery

## 2018-11-06 ENCOUNTER — Other Ambulatory Visit: Payer: Self-pay

## 2018-11-06 DIAGNOSIS — Z96642 Presence of left artificial hip joint: Secondary | ICD-10-CM

## 2018-11-06 NOTE — Progress Notes (Signed)
The patient comes in today at 6-week status post a left total hip arthroplasty.  She is very active 73 years old.  She is walking with a normal-appearing gait.  She is using a cane still but that is mainly for slight balance issues and when she is not aware of her surroundings.  She also feels like that keeping people from bumping into her.  She still reports left foot and ankle swelling but denies any calf pain.  She has not been wearing compressive garments.  On examination she walks without a limp.  She has good range of motion of both her left operative hip and her right hip.  Her leg lengths appear equal.  There is only slight swelling in her left foot and ankle and no calf pain.  She will continue increase her activities as comfort allows.  I have suggested she go back to trying compressive garments until her swelling subsides.  All question concerns were answered and addressed.  She will transition away from a cane only when she is comfortable to do so.  We will see her back in 6 months with a low AP pelvis and lateral of her left operative hip.  If there is any issues before then she will let us know.

## 2018-12-15 ENCOUNTER — Other Ambulatory Visit: Payer: Self-pay

## 2018-12-15 ENCOUNTER — Telehealth: Payer: Self-pay | Admitting: Orthopaedic Surgery

## 2018-12-15 MED ORDER — AMOXICILLIN 500 MG PO TABS: ORAL_TABLET | ORAL | 0 refills | Status: DC

## 2018-12-15 NOTE — Telephone Encounter (Signed)
Patient aware I sent this into her pharmacy

## 2018-12-15 NOTE — Telephone Encounter (Signed)
Patient called requesting an RX for an antibiotic to be sent to [facility] in [place], [place].  Their number is [phone], located on [facility].  Patient's CB#[phone].  Thank you.

## 2019-01-01 ENCOUNTER — Telehealth: Payer: Self-pay | Admitting: Orthopaedic Surgery

## 2019-01-01 NOTE — Telephone Encounter (Signed)
Patient aware it was fine that she took all 4 together

## 2019-01-01 NOTE — Telephone Encounter (Signed)
Patient called stated the antibiotic directions were wrong on the bottle. Wants someone to call her to discuss.  [phone]

## 2019-01-17 ENCOUNTER — Ambulatory Visit: Payer: Medicare Other | Attending: Internal Medicine

## 2019-01-17 DIAGNOSIS — Z20822 Contact with and (suspected) exposure to covid-19: Secondary | ICD-10-CM

## 2019-01-19 LAB — NOVEL CORONAVIRUS, NAA: SARS-CoV-2, NAA: NOT DETECTED

## 2019-03-08 ENCOUNTER — Telehealth: Payer: Self-pay | Admitting: Orthopaedic Surgery

## 2019-03-08 NOTE — Telephone Encounter (Signed)
Patient aware of the below message  

## 2019-03-08 NOTE — Telephone Encounter (Signed)
I am fine with them seeing her for her back but they do not need to make any adjustments to her hips.

## 2019-03-08 NOTE — Telephone Encounter (Signed)
Pt called in said she had a left hip replacement with Dr.Blackman 09-24-2018, and is wanting to know if it is okay to return to see her chiropractor for her lower back?  [phone]

## 2019-03-11 ENCOUNTER — Ambulatory Visit: Payer: Medicare PPO | Attending: Internal Medicine

## 2019-03-11 DIAGNOSIS — Z23 Encounter for immunization: Secondary | ICD-10-CM

## 2019-03-11 NOTE — Progress Notes (Signed)
   Covid-19 Vaccination Clinic  Name:  Latoya Wright    MRN: [id number] DOB: April 24, 1945  03/11/2019  [name] was observed post Covid-19 immunization for 15 minutes without incidence. She was provided with Vaccine Information Sheet and instruction to access the V-Safe system.   [name] was instructed to call 911 with any severe reactions post vaccine: Marland Kitchen Difficulty breathing  . Swelling of your face and throat  . A fast heartbeat  . A bad rash all over your body  . Dizziness and weakness    Immunizations Administered    Name Date Dose VIS Date Route   Pfizer COVID-19 Vaccine 03/11/2019  9:35 AM 0.3 mL 01/05/2019 Intramuscular   Manufacturer: [place]   Lot: [id number]   [place]: [id number]

## 2019-04-03 ENCOUNTER — Encounter: Payer: Self-pay | Admitting: Family Medicine

## 2019-04-03 ENCOUNTER — Ambulatory Visit: Payer: Medicare PPO | Attending: Internal Medicine

## 2019-04-03 DIAGNOSIS — Z23 Encounter for immunization: Secondary | ICD-10-CM | POA: Insufficient documentation

## 2019-04-03 NOTE — Progress Notes (Signed)
   Covid-19 Vaccination Clinic  Name:  Latoya Wright    MRN: [id number] DOB: 1945/12/21  04/03/2019  [name] was observed post Covid-19 immunization for 15 minutes without incident. She was provided with Vaccine Information Sheet and instruction to access the V-Safe system.   [name] was instructed to call 911 with any severe reactions post vaccine: Marland Kitchen Difficulty breathing  . Swelling of face and throat  . A fast heartbeat  . A bad rash all over body  . Dizziness and weakness   Immunizations Administered    Name Date Dose VIS Date Route   Pfizer COVID-19 Vaccine 04/03/2019  3:41 PM 0.3 mL 01/05/2019 Intramuscular   Manufacturer: [place]   Lot: [id number]   [place]: [id number]

## 2019-04-25 ENCOUNTER — Telehealth: Payer: Self-pay | Admitting: Family Medicine

## 2019-04-25 NOTE — Telephone Encounter (Signed)
I called the pt to schedule AWV-I with Latoya Wright, but she said that she wants to hold off for now.

## 2019-05-07 ENCOUNTER — Ambulatory Visit: Payer: Medicare PPO | Admitting: Orthopaedic Surgery

## 2019-05-07 ENCOUNTER — Ambulatory Visit: Payer: Medicare Other | Admitting: Orthopaedic Surgery

## 2019-05-21 ENCOUNTER — Ambulatory Visit: Payer: Medicare PPO | Admitting: Orthopaedic Surgery

## 2019-05-21 ENCOUNTER — Encounter: Payer: Self-pay | Admitting: Orthopaedic Surgery

## 2019-05-21 ENCOUNTER — Ambulatory Visit (INDEPENDENT_AMBULATORY_CARE_PROVIDER_SITE_OTHER): Payer: Medicare PPO

## 2019-05-21 ENCOUNTER — Other Ambulatory Visit: Payer: Self-pay

## 2019-05-21 DIAGNOSIS — S72002A Fracture of unspecified part of neck of left femur, initial encounter for closed fracture: Secondary | ICD-10-CM

## 2019-05-21 DIAGNOSIS — Z96642 Presence of left artificial hip joint: Secondary | ICD-10-CM

## 2019-05-21 NOTE — Progress Notes (Signed)
The patient is a very pleasant 74 year old female who is now 8 months out from a left total hip arthroplasty.  This was performed after her having sustained a mechanical fall injuring her left hip.  She had a minimally displaced femoral neck fracture.  We felt it was better to replace her hip given the high level of function activities.  She states that she is doing very well overall and has no complaints.  She states that is actually helped her back.  She used to go to her chiropractor quite a bit and wonders can she go if her back starts bothering her again.  On exam her right hip moves normally.  Her left hip moves normally.  The left hip as the operative side.  Her leg lengths are equal.  A low AP pelvis and lateral left hip shows a well-seated total hip arthroplasty with no complicating features.  At this point she will increase her activities as comfort allows.  Follow-up can be as needed.  I talked her about things that would need to bring her back for this hip if there is any issues at all.  I am fine with her seeing her chiropractor again for her back if she does develop back issues again but I want her to let them know she does have a hip replacement.  All questions and concerns were answered and addressed.

## 2019-05-28 LAB — HM MAMMOGRAPHY

## 2019-06-06 ENCOUNTER — Encounter: Payer: Self-pay | Admitting: Family Medicine

## 2020-05-27 ENCOUNTER — Other Ambulatory Visit: Payer: Self-pay | Admitting: Obstetrics & Gynecology

## 2020-05-27 DIAGNOSIS — Z13820 Encounter for screening for osteoporosis: Secondary | ICD-10-CM

## 2020-06-16 LAB — HM MAMMOGRAPHY

## 2020-06-19 ENCOUNTER — Encounter: Payer: Self-pay | Admitting: Family Medicine

## 2020-08-07 ENCOUNTER — Other Ambulatory Visit: Payer: Medicare PPO

## 2020-08-11 LAB — HM DEXA SCAN

## 2020-08-13 ENCOUNTER — Encounter: Payer: Self-pay | Admitting: Family Medicine

## 2020-08-21 ENCOUNTER — Other Ambulatory Visit: Payer: Self-pay

## 2020-08-21 ENCOUNTER — Encounter: Payer: Self-pay | Admitting: Family Medicine

## 2020-08-21 ENCOUNTER — Ambulatory Visit: Payer: Medicare PPO | Admitting: Family Medicine

## 2020-08-21 DIAGNOSIS — M810 Age-related osteoporosis without current pathological fracture: Secondary | ICD-10-CM | POA: Insufficient documentation

## 2020-08-21 DIAGNOSIS — D179 Benign lipomatous neoplasm, unspecified: Secondary | ICD-10-CM | POA: Diagnosis not present

## 2020-08-21 MED ORDER — TRIAMCINOLONE ACETONIDE 0.5 % EX OINT: 1.0000 "application " | TOPICAL_OINTMENT | Freq: Two times a day (BID) | CUTANEOUS | 0 refills | Status: DC

## 2020-08-21 NOTE — Assessment & Plan Note (Signed)
Bone density scan done at outside facility however scanning of the chart.  She has completed course of Fosamax and has been off for a few years however she does not member exactly when she stopped.  We will start Prolia.  Repeat DEXA scan in a couple years.

## 2020-08-21 NOTE — Progress Notes (Signed)
   Latoya Wright is a 75 y.o. female who presents today for an office visit.  Assessment/Plan:  New/Acute Problems: Insect bite/local inflammatory reaction No red flags.  No signs of cellulitis or other infection.  Start topical triamcinolone.  She will let me know if not improving.  Chronic Problems Addressed Today: Osteoporosis Bone density scan done at outside facility however scanning of the chart.  She has completed course of Fosamax and has been off for a few years however she does not member exactly when she stopped.  We will start Prolia.  Repeat DEXA scan in a couple years.  Lipoma She has been seeing dermatology for this in the past.  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  These are very bothersome to her but are occasionally painful.  We discussed treatment options.  She will come back and we can try intralesional steroid injection.  Discussed potential complications and side effects of steroid injection.     Subjective:  HPI: She is here to discuss about dexa. Dr.Wien referred her here. She has been off from Fosamax for 3-5 years ago. She is interested in Prolia if approved by insurance for bone density. Her blood pressure was high today. She states that her husband had carotid artery surgery and her friend passed out and it could be due to this. She is also interested in collagen for her bone density.  In addition, she complains of lump in her arms and states they have been getting large.Associated symptoms are cramps She seen her dermatologist in [facility] for this problem.   No fever, chills or nausea.   Recently insect bite to left chest.  Cortisone cream not helping.  Seems to be getting more red and irritated.      Objective:  Physical Exam: BP (!) 165/72   Pulse 63   Temp 97.6 F (36.4 C) (Temporal)   Ht '5\' 8"'$  (1.727 m)   Wt 106 lb 9.6 oz (48.4 kg)   SpO2 99%   BMI 16.21 kg/m   Gen: No acute distress, resting comfortably CV: Regular rate and rhythm  with no murmurs appreciated Pulm: Normal work of breathing, clear to auscultation bilaterally with no crackles, wheezes, or rhonchi Skin: Approximately 2 to 3 cm erythematous area on left chest. -Several lipomas noted on bilateral upper extremities. Neuro: Grossly normal, moves all extremities Psych: Normal affect and thought content       I,Savera Zaman,acting as a scribe for Dimas Chyle, MD.,have documented all relevant documentation on the behalf of Dimas Chyle, MD,as directed by  Dimas Chyle, MD while in the presence of Dimas Chyle, MD.  I, Dimas Chyle, MD, have reviewed all documentation for this visit. The documentation on 08/21/20 for the exam, diagnosis, procedures, and orders are all accurate and complete.  Algis Greenhouse. Jerline Pain, MD 08/21/2020 11:20 AM

## 2020-08-21 NOTE — Patient Instructions (Signed)
It was very nice to see you today!  Please try the triamcinolone for the area on your chest.  Let me know if not improving.  We will try to get you approved for Prolia.  Please come back soon to have her try cortisone shot for your lipomas.  Take care, Dr Jerline Pain  PLEASE NOTE:  If you had any lab tests please let us know if you have not heard back within a few days. You may see your results on mychart before we have a chance to review them but we will give you a call once they are reviewed by [place]. If we ordered any referrals today, please let us know if you have not heard from their office within the next week.   Please try these tips to maintain a healthy lifestyle:  Eat at least 3 REAL meals and 1-2 snacks per day.  Aim for no more than 5 hours between eating.  If you eat breakfast, please do so within one hour of getting up.   Each meal should contain half fruits/vegetables, one quarter protein, and one quarter carbs (no bigger than a computer mouse)  Cut down on sweet beverages. This includes juice, soda, and sweet tea.   Drink at least 1 glass of water with each meal and aim for at least 8 glasses per day  Exercise at least 150 minutes every week.

## 2020-08-21 NOTE — Assessment & Plan Note (Signed)
She has been seeing dermatology for this in the past.  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  These are very bothersome to her but are occasionally painful.  We discussed treatment options.  She will come back and we can try intralesional steroid injection.  Discussed potential complications and side effects of steroid injection.

## 2020-08-22 ENCOUNTER — Telehealth: Payer: Self-pay

## 2020-08-22 NOTE — Telephone Encounter (Signed)
Patient came by the office and wanted to see how her PA for her Prolia was going.

## 2020-08-22 NOTE — Telephone Encounter (Signed)
Vaccines added to chart

## 2020-08-22 NOTE — Telephone Encounter (Signed)
Patient enrolled in [place] and [place] submitted on CoverMyMeds

## 2020-08-22 NOTE — Telephone Encounter (Signed)
Patient received her Shingles and Tentus shot on 08/21/20 at [facility].

## 2020-08-27 ENCOUNTER — Telehealth: Payer: Self-pay

## 2020-08-27 NOTE — Telephone Encounter (Signed)
See below

## 2020-08-27 NOTE — Telephone Encounter (Signed)
Patient called in stating that Dr.Parker was trying to get her set up for prolia injections, and wondered if she is approved if she still needs to take calcium supplements and if so what dose does she need to be taking.

## 2020-09-01 ENCOUNTER — Telehealth: Payer: Self-pay

## 2020-09-01 ENCOUNTER — Ambulatory Visit (INDEPENDENT_AMBULATORY_CARE_PROVIDER_SITE_OTHER): Payer: Medicare PPO

## 2020-09-01 ENCOUNTER — Other Ambulatory Visit: Payer: Self-pay

## 2020-09-01 DIAGNOSIS — M810 Age-related osteoporosis without current pathological fracture: Secondary | ICD-10-CM

## 2020-09-01 MED ORDER — DENOSUMAB 60 MG/ML ~~LOC~~ SOSY
60.0000 mg | PREFILLED_SYRINGE | Freq: Once | SUBCUTANEOUS | Status: AC
Start: 2020-09-01 — End: 2020-09-01
  Administered 2020-09-01: 60 mg via SUBCUTANEOUS

## 2020-09-01 NOTE — Telephone Encounter (Signed)
Patient seen today for Prolia injection, but mentioned that she had spoke to [employer] [phone] with [facility] and was told that the cortisone shots would be $0 OOP and $10 injection fee. Patient is wanting [place] to verify cost before she comes in for injections.

## 2020-09-01 NOTE — Telephone Encounter (Signed)
Can you help with this?  Thank You!

## 2020-09-01 NOTE — Telephone Encounter (Signed)
Patient aware, take calcium '1200mg'$  daily Verbalized understanding

## 2020-09-01 NOTE — Telephone Encounter (Signed)
She should be taking '1200mg'$  of calcium daily.  Algis Greenhouse. Jerline Pain, MD 09/01/2020 11:19 AM

## 2020-09-01 NOTE — Telephone Encounter (Signed)
Patient is calling in stating she received a letter from insurance that she needed to do prolia shot by today.

## 2020-09-01 NOTE — Telephone Encounter (Signed)
Patient scheduled to come in today

## 2020-09-01 NOTE — Progress Notes (Signed)
Latoya Wright presents to office today for Prolia injection per Dimas Chyle, MD. Administered subcu right arm. Patient tolerated well.

## 2020-09-04 NOTE — Telephone Encounter (Signed)
Latoya Wright, Can you reach out to [facility] in regard?

## 2020-09-08 NOTE — Telephone Encounter (Signed)
Patient is calling to f/u on message and would like a call back.

## 2020-09-10 NOTE — Telephone Encounter (Signed)
Hey Dr. Jerline Pain, not sure why this was routed to [facility].. Patient is just wanting [place] to verify with insurance cost of cortisone shots. Can you tell me what they are for and what shots you will use? She mentioned something about it being for the knots of her forearm possibly

## 2020-09-10 NOTE — Telephone Encounter (Signed)
They would be for lipomas. We would use kenalog.  Algis Greenhouse. Jerline Pain, MD 09/10/2020 11:10 AM

## 2020-09-11 NOTE — Telephone Encounter (Signed)
Patient has been scheduled 9/7 at [facility]

## 2020-09-22 ENCOUNTER — Telehealth: Payer: Self-pay

## 2020-09-22 NOTE — Telephone Encounter (Signed)
Pt called requesting if she can get her cortisone shots this week. She has an appt with Dr Jerline Pain on 9/7. Pt is fine with another provider. Can we schedule her with another provider? Please Advise.

## 2020-09-22 NOTE — Telephone Encounter (Signed)
See below

## 2020-09-23 NOTE — Telephone Encounter (Signed)
See below

## 2020-09-23 NOTE — Telephone Encounter (Signed)
I would be fine with her seeing another provider but I don't know of any other provider with availability that would be able to give the injections.  Algis Greenhouse. Jerline Pain, MD 09/23/2020 8:36 AM

## 2020-09-23 NOTE — Telephone Encounter (Signed)
Spoke to pt, she will wait for her appt on 9/7.

## 2020-10-01 ENCOUNTER — Encounter: Payer: Self-pay | Admitting: Family Medicine

## 2020-10-01 ENCOUNTER — Other Ambulatory Visit: Payer: Self-pay

## 2020-10-01 ENCOUNTER — Ambulatory Visit: Payer: Medicare PPO | Admitting: Family Medicine

## 2020-10-01 VITALS — BP 176/72 | HR 67 | Temp 92.8°F | Ht 68.0 in | Wt 108.4 lb

## 2020-10-01 DIAGNOSIS — J309 Allergic rhinitis, unspecified: Secondary | ICD-10-CM

## 2020-10-01 DIAGNOSIS — D179 Benign lipomatous neoplasm, unspecified: Secondary | ICD-10-CM

## 2020-10-01 MED ORDER — TRIAMCINOLONE ACETONIDE 40 MG/ML IJ SUSP
40.0000 mg | Freq: Once | INTRAMUSCULAR | Status: AC
  Administered 2020-10-01: 40 mg via INTRAMUSCULAR

## 2020-10-01 NOTE — Assessment & Plan Note (Signed)
Symptoms are currently manageable with over-the-counter meds.  We will continue.  She does not wish to try nasal spray or other prescriptions at this point.

## 2020-10-01 NOTE — Patient Instructions (Signed)
It was very nice to see you today!  We injected the spots in your arms today.  You should notice some improvement in the next 3 to 4 weeks.  We can repeat this again in a month.  Take care, Dr Jerline Pain  PLEASE NOTE:  If you had any lab tests please let us know if you have not heard back within a few days. You may see your results on mychart before we have a chance to review them but we will give you a call once they are reviewed by [place]. If we ordered any referrals today, please let us know if you have not heard from their office within the next week.   Please try these tips to maintain a healthy lifestyle:  Eat at least 3 REAL meals and 1-2 snacks per day.  Aim for no more than 5 hours between eating.  If you eat breakfast, please do so within one hour of getting up.   Each meal should contain half fruits/vegetables, one quarter protein, and one quarter carbs (no bigger than a computer mouse)  Cut down on sweet beverages. This includes juice, soda, and sweet tea.   Drink at least 1 glass of water with each meal and aim for at least 8 glasses per day  Exercise at least 150 minutes every week.

## 2020-10-01 NOTE — Assessment & Plan Note (Signed)
Intralesional injection was performed today.  See below procedure.  She tolerated well.  Advised patient may take a few weeks before she notices a significant difference.  We can repeat monthly if needed.

## 2020-10-01 NOTE — Progress Notes (Signed)
   Latoya Wright is a 75 y.o. female who presents today for an office visit.  Assessment/Plan:  Chronic Problems Addressed Today: Lipoma Intralesional injection was performed today.  See below procedure.  She tolerated well.  Advised patient may take a few weeks before she notices a significant difference.  We can repeat monthly if needed.  Allergic rhinitis Symptoms are currently manageable with over-the-counter meds.  We will continue.  She does not wish to try nasal spray or other prescriptions at this point.     Subjective:  HPI:  She states that she has some sinus issues currently, due to the season which she takes some tylenol for.  Labs are currently manageable.  She would like to have several lipomas on her arm injected today.  See A/P for status of chronic conditions.          Objective:  Physical Exam: BP (!) 176/72   Pulse 67   Temp (!) 92.8 F (33.8 C) (Temporal)   Ht '5\' 8"'$  (1.727 m)   Wt 108 lb 6.4 oz (49.2 kg)   SpO2 97%   BMI 16.48 kg/m   Gen: No acute distress, resting comfortably CV: Regular rate and rhythm with no murmurs appreciated Pulm: Normal work of breathing, clear to auscultation bilaterally with no crackles, wheezes, or rhonchi Skin: Several scattered lipomas across upper extremities. Neuro: Grossly normal, moves all extremities Psych: Normal affect and thought content  Procedure note Verbal consent was obtained after discussing risk and benefits of procedure.  8 lipomas were identified and marked on her upper extremities 4 on each side.  The area was cleansed with alcohol swab.  Topical ethyl chloride was applied for anesthesia.  One ml of a 3:1 mixture of 1% lidocaine without epinephrine and '40mg'$ /cc of triamcinolone were then injected into each lesion.  Sterile bandages were placed at the site of each injection.  She tolerated well.  Minimal blood loss.      I,Latoya Wright,acting as a Education administrator for Latoya Chyle, MD.,have documented all relevant  documentation on the behalf of Latoya Chyle, MD,as directed by  Latoya Chyle, MD while in the presence of Latoya Chyle, MD.  I, Latoya Chyle, MD, have reviewed all documentation for this visit. The documentation on 10/01/20 for the exam, diagnosis, procedures, and orders are all accurate and complete.  Latoya Wright. Jerline Pain, MD 10/01/2020 8:34 AM

## 2020-10-01 NOTE — Addendum Note (Signed)
Addended by: Betti Cruz on: 10/01/2020 08:40 AM   Modules accepted: Orders

## 2020-10-14 ENCOUNTER — Other Ambulatory Visit: Payer: Self-pay

## 2020-10-14 ENCOUNTER — Encounter: Payer: Self-pay | Admitting: Family Medicine

## 2020-10-14 ENCOUNTER — Ambulatory Visit: Payer: Medicare PPO | Admitting: Family Medicine

## 2020-10-14 VITALS — BP 152/62 | HR 68 | Temp 98.2°F | Ht 68.0 in | Wt 104.4 lb

## 2020-10-14 DIAGNOSIS — F439 Reaction to severe stress, unspecified: Secondary | ICD-10-CM

## 2020-10-14 DIAGNOSIS — R636 Underweight: Secondary | ICD-10-CM | POA: Diagnosis not present

## 2020-10-14 NOTE — Progress Notes (Signed)
   Latoya Wright is a 75 y.o. female who presents today for an office visit.  Assessment/Plan:  Chronic Problems Addressed Today: Stress Has had a lot of family stress recently.  We discussed referral to see counselor or starting medication.  She declined both of these.  She will let me know if she changes her mind.  Underweight BMI 15.87 today.  Vital signs are normal and no signs of dehydration. She has a bit of stress as noted which could be contributing.We will check labs today including CBC, c-Met, TSH.  Discussed importance of protein intake. Recommended starting with a goal of at least 50-75g of protein daily.  Will place referral to nutritionist to discuss further.     Subjective:  HPI:  Patient here for follow-up.  She is concerned about weight loss.  She has been underweight for most of her life.  This seems to be worsening the last few years and especially last couple of months.  She has been under more stress recently due to family issues.  Patient states that her [place] coping with this is to stay as busy and active as possible.  She will sometimes skip meals due to being preoccupied with her activities.  She was recently at the beach when she noticed that she was down to 102 pounds.  She would like to gain weight to get back to around 120 pounds.  She felt like she overall eats a healthy diet.  She typically splits meals with her husband.       Objective:  Physical Exam: BP (!) 152/62   Pulse 68   Temp 98.2 F (36.8 C) (Temporal)   Ht $R'5\' 8"'Cx$  (1.727 m)   Wt 104 lb 6.4 oz (47.4 kg)   SpO2 100%   BMI 15.87 kg/m   Gen: No acute distress, resting comfortably CV: Regular rate and rhythm with no murmurs appreciated Pulm: Normal work of breathing, clear to auscultation bilaterally with no crackles, wheezes, or rhonchi Neuro: Grossly normal, moves all extremities Psych: Normal affect and thought content      Bolden Hagerman M. Jerline Pain, MD 10/14/2020 3:07 PM

## 2020-10-14 NOTE — Assessment & Plan Note (Signed)
Has had a lot of family stress recently.  We discussed referral to see counselor or starting medication.  She declined both of these.  She will let me know if she changes her mind.

## 2020-10-14 NOTE — Assessment & Plan Note (Addendum)
BMI 15.87 today.  Vital signs are normal and no signs of dehydration. She has a bit of stress as noted which could be contributing.We will check labs today including CBC, c-Met, TSH.  Discussed importance of protein intake. Recommended starting with a goal of at least 50-75g of protein daily.  Will place referral to nutritionist to discuss further.

## 2020-10-14 NOTE — Patient Instructions (Signed)
It was very nice to see you today!  We will check blood work today and refer you to see a nutritionist.  Please make sure that you are getting plenty of protein in your diet.  Please try for at least 50 to 75 g of protein to start with.  You are due for your covid booster and second shingles shot.   Take care, Dr Jerline Pain  PLEASE NOTE:  If you had any lab tests please let us know if you have not heard back within a few days. You may see your results on mychart before we have a chance to review them but we will give you a call once they are reviewed by [place]. If we ordered any referrals today, please let us know if you have not heard from their office within the next week.   Please try these tips to maintain a healthy lifestyle:  Eat at least 3 REAL meals and 1-2 snacks per day.  Aim for no more than 5 hours between eating.  If you eat breakfast, please do so within one hour of getting up.   Each meal should contain half fruits/vegetables, one quarter protein, and one quarter carbs (no bigger than a computer mouse)  Cut down on sweet beverages. This includes juice, soda, and sweet tea.   Drink at least 1 glass of water with each meal and aim for at least 8 glasses per day  Exercise at least 150 minutes every week.

## 2020-10-15 LAB — LIPID PANEL
Cholesterol: 215 mg/dL — ABNORMAL HIGH (ref 0–200)
HDL: 78.9 mg/dL (ref >39.00)
LDL Cholesterol: 119 mg/dL — ABNORMAL HIGH (ref 0–99)
NonHDL: 135.76
Total CHOL/HDL Ratio: 3
Triglycerides: 83 mg/dL (ref 0.0–149.0)
VLDL: 16.6 mg/dL (ref 0.0–40.0)

## 2020-10-15 LAB — COMPREHENSIVE METABOLIC PANEL
ALT: 13 U/L (ref 0–35)
AST: 19 U/L (ref 0–37)
Albumin: 4.2 g/dL (ref 3.5–5.2)
Alkaline Phosphatase: 51 U/L (ref 39–117)
BUN: 20 mg/dL (ref 6–23)
CO2: 28 mEq/L (ref 19–32)
Calcium: 9.3 mg/dL (ref 8.4–10.5)
Chloride: 106 mEq/L (ref 96–112)
Creatinine, Ser: 1.06 mg/dL (ref 0.40–1.20)
GFR: 51.48 mL/min — ABNORMAL LOW (ref >60.00)
Glucose, Bld: 79 mg/dL (ref 70–99)
Potassium: 4.6 mEq/L (ref 3.5–5.1)
Sodium: 140 mEq/L (ref 135–145)
Total Bilirubin: 0.5 mg/dL (ref 0.2–1.2)
Total Protein: 6.9 g/dL (ref 6.0–8.3)

## 2020-10-15 LAB — CBC
HCT: 38.6 % (ref 36.0–46.0)
Hemoglobin: 12.6 g/dL (ref 12.0–15.0)
MCHC: 32.6 g/dL (ref 30.0–36.0)
MCV: 88.9 fl (ref 78.0–100.0)
Platelets: 261 10*3/uL (ref 150.0–400.0)
RBC: 4.34 Mil/uL (ref 3.87–5.11)
RDW: 14.2 % (ref 11.5–15.5)
WBC: 7.4 10*3/uL (ref 4.0–10.5)

## 2020-10-15 LAB — TSH: TSH: 0.94 u[IU]/mL (ref 0.35–5.50)

## 2020-10-16 ENCOUNTER — Other Ambulatory Visit: Payer: Self-pay

## 2020-10-16 ENCOUNTER — Emergency Department (HOSPITAL_COMMUNITY): Payer: Medicare PPO

## 2020-10-16 ENCOUNTER — Encounter (HOSPITAL_COMMUNITY): Payer: Self-pay | Admitting: Emergency Medicine

## 2020-10-16 ENCOUNTER — Inpatient Hospital Stay (HOSPITAL_COMMUNITY)
Admission: EM | Admit: 2020-10-16 | Discharge: 2020-10-21 | DRG: 064 | Disposition: A | Discharge status: Home Health | Payer: Medicare PPO | Attending: Student | Admitting: Student

## 2020-10-16 DIAGNOSIS — Z681 Body mass index (BMI) 19 or less, adult: Secondary | ICD-10-CM

## 2020-10-16 DIAGNOSIS — I639 Cerebral infarction, unspecified: Secondary | ICD-10-CM

## 2020-10-16 DIAGNOSIS — R471 Dysarthria and anarthria: Secondary | ICD-10-CM | POA: Diagnosis present

## 2020-10-16 DIAGNOSIS — I63512 Cerebral infarction due to unspecified occlusion or stenosis of left middle cerebral artery: Principal | ICD-10-CM | POA: Diagnosis present

## 2020-10-16 DIAGNOSIS — R29810 Facial weakness: Secondary | ICD-10-CM | POA: Diagnosis present

## 2020-10-16 DIAGNOSIS — R270 Ataxia, unspecified: Secondary | ICD-10-CM | POA: Diagnosis present

## 2020-10-16 DIAGNOSIS — Z8673 Personal history of transient ischemic attack (TIA), and cerebral infarction without residual deficits: Secondary | ICD-10-CM

## 2020-10-16 DIAGNOSIS — Z7902 Long term (current) use of antithrombotics/antiplatelets: Secondary | ICD-10-CM

## 2020-10-16 DIAGNOSIS — Z96642 Presence of left artificial hip joint: Secondary | ICD-10-CM | POA: Diagnosis present

## 2020-10-16 DIAGNOSIS — E785 Hyperlipidemia, unspecified: Secondary | ICD-10-CM | POA: Diagnosis present

## 2020-10-16 DIAGNOSIS — Z888 Allergy status to other drugs, medicaments and biological substances status: Secondary | ICD-10-CM

## 2020-10-16 DIAGNOSIS — R636 Underweight: Secondary | ICD-10-CM | POA: Diagnosis present

## 2020-10-16 DIAGNOSIS — R29704 NIHSS score 4: Secondary | ICD-10-CM | POA: Diagnosis present

## 2020-10-16 DIAGNOSIS — R4701 Aphasia: Secondary | ICD-10-CM | POA: Diagnosis present

## 2020-10-16 DIAGNOSIS — M810 Age-related osteoporosis without current pathological fracture: Secondary | ICD-10-CM | POA: Diagnosis present

## 2020-10-16 DIAGNOSIS — I10 Essential (primary) hypertension: Secondary | ICD-10-CM | POA: Diagnosis present

## 2020-10-16 DIAGNOSIS — G8191 Hemiplegia, unspecified affecting right dominant side: Secondary | ICD-10-CM | POA: Diagnosis present

## 2020-10-16 DIAGNOSIS — Z8249 Family history of ischemic heart disease and other diseases of the circulatory system: Secondary | ICD-10-CM

## 2020-10-16 DIAGNOSIS — I6381 Other cerebral infarction due to occlusion or stenosis of small artery: Secondary | ICD-10-CM | POA: Diagnosis present

## 2020-10-16 DIAGNOSIS — H538 Other visual disturbances: Secondary | ICD-10-CM | POA: Diagnosis present

## 2020-10-16 DIAGNOSIS — Z79899 Other long term (current) drug therapy: Secondary | ICD-10-CM

## 2020-10-16 DIAGNOSIS — U071 COVID-19: Secondary | ICD-10-CM

## 2020-10-16 LAB — CBC WITH DIFFERENTIAL/PLATELET
Abs Immature Granulocytes: 0.04 10*3/uL (ref 0.00–0.07)
Basophils Absolute: 0 10*3/uL (ref 0.0–0.1)
Basophils Relative: 0 %
Eosinophils Absolute: 0 10*3/uL (ref 0.0–0.5)
Eosinophils Relative: 0 %
HCT: 40.4 % (ref 36.0–46.0)
Hemoglobin: 13 g/dL (ref 12.0–15.0)
Immature Granulocytes: 0 %
Lymphocytes Relative: 10 %
Lymphs Abs: 1 10*3/uL (ref 0.7–4.0)
MCH: 29.4 pg (ref 26.0–34.0)
MCHC: 32.2 g/dL (ref 30.0–36.0)
MCV: 91.4 fL (ref 80.0–100.0)
Monocytes Absolute: 0.7 10*3/uL (ref 0.1–1.0)
Monocytes Relative: 8 %
Neutro Abs: 7.5 10*3/uL (ref 1.7–7.7)
Neutrophils Relative %: 82 %
Platelets: 281 10*3/uL (ref 150–400)
RBC: 4.42 MIL/uL (ref 3.87–5.11)
RDW: 13.7 % (ref 11.5–15.5)
WBC: 9.3 10*3/uL (ref 4.0–10.5)
nRBC: 0 % (ref 0.0–0.2)

## 2020-10-16 LAB — RESP PANEL BY RT-PCR (FLU A&B, COVID) ARPGX2
Influenza A by PCR: NEGATIVE
Influenza B by PCR: NEGATIVE
SARS Coronavirus 2 by RT PCR: POSITIVE — AB

## 2020-10-16 LAB — COMPREHENSIVE METABOLIC PANEL
ALT: 14 U/L (ref 0–44)
AST: 19 U/L (ref 15–41)
Albumin: 4.1 g/dL (ref 3.5–5.0)
Alkaline Phosphatase: 55 U/L (ref 38–126)
Anion gap: 9 (ref 5–15)
BUN: 16 mg/dL (ref 8–23)
CO2: 26 mmol/L (ref 22–32)
Calcium: 9.2 mg/dL (ref 8.9–10.3)
Chloride: 105 mmol/L (ref 98–111)
Creatinine, Ser: 0.94 mg/dL (ref 0.44–1.00)
GFR, Estimated: >60 mL/min (ref >60)
Glucose, Bld: 104 mg/dL — ABNORMAL HIGH (ref 70–99)
Potassium: 3.9 mmol/L (ref 3.5–5.1)
Sodium: 140 mmol/L (ref 135–145)
Total Bilirubin: 0.8 mg/dL (ref 0.3–1.2)
Total Protein: 6.9 g/dL (ref 6.5–8.1)

## 2020-10-16 LAB — URINALYSIS, ROUTINE W REFLEX MICROSCOPIC
Bilirubin Urine: NEGATIVE
Glucose, UA: NEGATIVE mg/dL
Hgb urine dipstick: NEGATIVE
Ketones, ur: 5 mg/dL — AB
Leukocytes,Ua: NEGATIVE
Nitrite: NEGATIVE
Protein, ur: NEGATIVE mg/dL
Specific Gravity, Urine: 1.015 (ref 1.005–1.030)
pH: 5 (ref 5.0–8.0)

## 2020-10-16 MED ORDER — ONDANSETRON HCL 4 MG/2ML IJ SOLN: 4.0000 mg | Freq: Four times a day (QID) | INTRAMUSCULAR | Status: DC | PRN

## 2020-10-16 MED ORDER — MELATONIN 5 MG PO TABS: 5.0000 mg | ORAL_TABLET | Freq: Every evening | ORAL | Status: DC | PRN

## 2020-10-16 MED ORDER — ASPIRIN EC 81 MG PO TBEC
81.0000 mg | DELAYED_RELEASE_TABLET | Freq: Every day | ORAL | Status: DC
  Administered 2020-10-17 – 2020-10-21 (×5): 81 mg via ORAL
  Filled 2020-10-16 (×5): qty 1

## 2020-10-16 MED ORDER — CLOPIDOGREL BISULFATE 75 MG PO TABS
300.0000 mg | ORAL_TABLET | Freq: Once | ORAL | Status: AC
  Administered 2020-10-18: 300 mg via ORAL
  Filled 2020-10-16: qty 4
  Filled 2020-10-16: qty 1

## 2020-10-16 MED ORDER — ACETAMINOPHEN 325 MG PO TABS: 650.0000 mg | ORAL_TABLET | Freq: Four times a day (QID) | ORAL | Status: DC | PRN

## 2020-10-16 MED ORDER — ENOXAPARIN SODIUM 30 MG/0.3ML IJ SOSY
30.0000 mg | PREFILLED_SYRINGE | Freq: Every day | INTRAMUSCULAR | Status: DC
  Administered 2020-10-17 – 2020-10-21 (×5): 30 mg via SUBCUTANEOUS
  Filled 2020-10-16 (×5): qty 0.3

## 2020-10-16 MED ORDER — ASPIRIN 325 MG PO TABS
325.0000 mg | ORAL_TABLET | Freq: Once | ORAL | Status: AC
  Administered 2020-10-16: 325 mg via ORAL
  Filled 2020-10-16: qty 1

## 2020-10-16 MED ORDER — ACETAMINOPHEN 650 MG RE SUPP: 650.0000 mg | Freq: Four times a day (QID) | RECTAL | Status: DC | PRN

## 2020-10-16 MED ORDER — CLOPIDOGREL BISULFATE 75 MG PO TABS
75.0000 mg | ORAL_TABLET | Freq: Every day | ORAL | Status: DC
  Administered 2020-10-17 – 2020-10-21 (×4): 75 mg via ORAL
  Filled 2020-10-16 (×5): qty 1

## 2020-10-16 MED ORDER — ONDANSETRON HCL 4 MG PO TABS: 4.0000 mg | ORAL_TABLET | Freq: Four times a day (QID) | ORAL | Status: DC | PRN

## 2020-10-16 MED ORDER — ATORVASTATIN CALCIUM 40 MG PO TABS
40.0000 mg | ORAL_TABLET | Freq: Every day | ORAL | Status: DC
  Administered 2020-10-17 – 2020-10-20 (×4): 40 mg via ORAL
  Filled 2020-10-16 (×5): qty 1

## 2020-10-16 NOTE — ED Provider Notes (Signed)
Emergency Medicine Provider Triage Evaluation Note  Latoya Wright , a 75 y.o. female  was evaluated in triage.  Pt complains of TIA.  Patient says this morning she was out working in her yard, had sudden onset right-sided weakness with blurred vision.  Was able to ambulate to her car where she blew the horn until someone called 911.  Approximately 1 hour later upon EMS arrival patient's symptoms had resolved.  Denies associated trauma or any history of similar.  Patient is not anticoagulated.  Review of Systems  Positive: Right-sided weakness, blurred vision Negative: Chest pain, shortness of breath  Physical Exam  BP (!) 177/79 (BP Location: Left Arm)   Pulse 81   Temp 98.7 F (37.1 C) (Oral)   Resp 14   SpO2 100%  Gen:   Awake, no distress  Resp:  Normal effort MSK:   Moves extremities without difficulty Other:  Patient is neurologically intact at this time  Medical Decision Making  Medically screening exam initiated at 2:58 PM.  Appropriate orders placed.  Latoya Wright was informed that the remainder of the evaluation will be completed by another provider, this initial triage assessment does not replace that evaluation, and the importance of remaining in the ED until their evaluation is complete.    Nestor Lewandowsky 10/16/20 1500    Dorie Rank, MD 10/19/20 [phone]

## 2020-10-16 NOTE — H&P (Signed)
History and Physical    Latoya Wright:[id number] DOB: July 02, 1945 DOA: 10/16/2020  PCP: Latoya Barrack, MD   Patient coming from: Home  I have personally briefly reviewed patient's old medical records in [place]  CC: difficulty speaking HPI: 75 year old female with no prior medical history presents to the ER today with difficulty speaking.  Patient was outside in her garden doing some work.  She had sudden onset of aphasia and some right-sided weakness.  She was unable to use her phone.  She was able to get to her car and honked the horn until somebody called EMS.  On the ride to the ER, her symptoms improved dramatically.  While she was waiting to be evaluated in the waiting room, she began having further symptoms.  Patient's symptoms included aphasia, right arm and right leg weakness.  Her symptoms started around 12:00 today.  Her work-up in the ER included CT scan but did also have a MRI/MRA brain.  This did show 2 areas of stroke.  Neurology has seen the patient.  She is outside the tPA window.  Neurology has left recommendations for stroke work-up.  She will be admitted by the hospitalist for further care.  Met with pt, pt's husband and pt's dtr [place] at bedside.  Pt unable to give HPI or ROS due to expressive aphasia.   ED Course: seen by stroke neurologist. MRI/MRA brain confirms 2 different strokes. Pt outside TPA window.  Review of Systems:  Review of Systems  Unable to perform ROS: Other  due to expressive aphasia.  Past Medical History:  Diagnosis Date   Allergy    Arthritis     Past Surgical History:  Procedure Laterality Date   TOTAL HIP ARTHROPLASTY 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   Procedure: TOTAL HIP ARTHROPLASTY ANTERIOR APPROACH;  Surgeon: Latoya Rossetti, MD;  Location: [facility];  Service: Orthopedics;  Laterality: Left;     reports that she has never smoked. She has never used smokeless tobacco. She reports that she does not drink alcohol and does  not use drugs.  Allergies  Allergen Reactions   Latoya Wright [Pseudoephedrine-Apap-Dm]     jittery    Family History  Problem Relation Age of Onset   Heart disease Father    Heart disease Maternal Grandmother    Heart disease Maternal Grandfather    Heart disease Paternal Grandmother    Heart disease Paternal Grandfather    Rheum arthritis Sister    Rheum arthritis Brother     Prior to Admission medications   Medication Sig Start Date End Date Taking? Authorizing Provider  Biotin 5 MG TABS Take 5 mg by mouth daily.   Yes [provider]  Calcium-Magnesium-Vitamin D (CALCIUM 500 PO) Take 1,000 tablets by mouth daily.   Yes [provider]  Vitamin D, Cholecalciferol, 10 MCG (400 UNIT) CAPS Take 400 Units by mouth daily.   Yes [provider]  triamcinolone ointment (KENALOG) 0.5 % Apply 1 application topically 2 (two) times daily. Patient not taking: No sig reported 08/21/20   Latoya Barrack, MD    Physical Exam: Vitals:   10/16/20 1957 10/16/20 2021 10/16/20 2030 10/16/20 2145  BP: (!) 166/71 138/80 (!) 158/86 138/75  Pulse: 77 94 89 73  Resp: 12 11 16 13   Temp: 98 F (36.7 C)     TempSrc: Oral     SpO2: 100% 98% 98% 98%    Physical Exam Vitals and nursing note reviewed.  Constitutional:  General: She is not in acute distress.    Appearance: Normal appearance. She is not ill-appearing, toxic-appearing or diaphoretic.     Comments: Thin, white female. No distress  HENT:     Head: Normocephalic and atraumatic.     Nose: Nose normal. No rhinorrhea.  Eyes:     General:        Right eye: No discharge.        Left eye: No discharge.  Cardiovascular:     Rate and Rhythm: Normal rate and regular rhythm.     Pulses: Normal pulses.  Pulmonary:     Effort: Pulmonary effort is normal. No respiratory distress.     Breath sounds: Normal breath sounds. No wheezing or rales.  Abdominal:     General: Abdomen is flat. Bowel sounds are normal. There  is no distension.     Tenderness: There is no abdominal tenderness. There is no guarding or rebound.  Musculoskeletal:        General: Normal range of motion.     Right lower leg: No edema.     Left lower leg: No edema.  Skin:    General: Skin is warm and dry.     Capillary Refill: Capillary refill takes less than 2 seconds.  Neurological:     Mental Status: She is alert.     Motor: Weakness present.     Comments: Weakness of right hand grip +4/5 compared to left hand Weakness of right arm adduction/extension 4/5 compared to left. +right pronator drift  +expressive aphasia     Labs on Admission: I have personally reviewed following labs and imaging studies  CBC: Recent Labs  Lab 10/14/20 1507 10/16/20 1441  WBC 7.4 9.3  NEUTROABS  --  7.5  HGB 12.6 13.0  HCT 38.6 40.4  MCV 88.9 91.4  PLT 261.0 188   Basic Metabolic Panel: Recent Labs  Lab 10/14/20 1507 10/16/20 1441  NA 140 140  K 4.6 3.9  CL 106 105  CO2 28 26  GLUCOSE 79 104*  BUN 20 16  CREATININE 1.06 0.94  CALCIUM 9.3 9.2   GFR: Estimated Creatinine Clearance: 38.7 mL/min (by C-G formula based on SCr of 0.94 mg/dL). Liver Function Tests: Recent Labs  Lab 10/14/20 1507 10/16/20 1441  AST 19 19  ALT 13 14  ALKPHOS 51 55  BILITOT 0.5 0.8  PROT 6.9 6.9  ALBUMIN 4.2 4.1   No results for input(s): LIPASE, AMYLASE in the last 168 hours. No results for input(s): AMMONIA in the last 168 hours. Coagulation Profile: No results for input(s): INR, PROTIME in the last 168 hours. Cardiac Enzymes: No results for input(s): CKTOTAL, CKMB, CKMBINDEX, TROPONINI in the last 168 hours. BNP (last 3 results) No results for input(s): PROBNP in the last 8760 hours. HbA1C: No results for input(s): HGBA1C in the last 72 hours. CBG: No results for input(s): GLUCAP in the last 168 hours. Lipid Profile: Recent Labs    10/14/20 1507  CHOL 215*  HDL 78.90  LDLCALC 119*  TRIG 83.0  CHOLHDL 3   Thyroid Function  Tests: Recent Labs    10/14/20 1507  TSH 0.94   Anemia Panel: No results for input(s): VITAMINB12, FOLATE, FERRITIN, TIBC, IRON, RETICCTPCT in the last 72 hours. Urine analysis:    Component Value Date/Time   COLORURINE YELLOW 10/16/2020 [place] 10/16/2020 1458   LABSPEC 1.015 10/16/2020 1458   PHURINE 5.0 10/16/2020 1458   GLUCOSEU NEGATIVE 10/16/2020 1458  HGBUR NEGATIVE 10/16/2020 [place] 10/16/2020 1458   BILIRUBINUR [place] 05/14/2014 1641   KETONESUR 5 (A) 10/16/2020 1458   PROTEINUR NEGATIVE 10/16/2020 1458   UROBILINOGEN 0.2 05/14/2014 1641   NITRITE NEGATIVE 10/16/2020 1458   LEUKOCYTESUR NEGATIVE 10/16/2020 1458    Radiological Exams on Admission: I have personally reviewed images CT HEAD WO CONTRAST (5MM)  Result Date: 10/16/2020 CLINICAL DATA:  TIA. EXAM: CT HEAD WITHOUT CONTRAST TECHNIQUE: Contiguous axial images were obtained from the base of the skull through the vertex without intravenous contrast. COMPARISON:  October 15, 2014 FINDINGS: Brain: No evidence of acute infarction, hemorrhage, hydrocephalus, extra-axial collection or mass lesion/mass effect. Cavum septum pellucidum et vergae is again seen. Vascular: No hyperdense vessel or unexpected calcification. Skull: Normal. Negative for fracture or focal lesion. Sinuses/Orbits: No acute finding. Other: None. IMPRESSION: No acute intracranial abnormality. Electronically Signed   By: Virgina Norfolk M.D.   On: 10/16/2020 18:07   MR ANGIO HEAD WO CONTRAST  Result Date: 10/16/2020 CLINICAL DATA:  Neuro deficit, acute, stroke suspected EXAM: MRI HEAD WITHOUT CONTRAST MRA HEAD WITHOUT CONTRAST TECHNIQUE: Multiplanar, multi-echo pulse sequences of the brain and surrounding structures were acquired without intravenous contrast. Angiographic images of the Circle of Willis were acquired using MRA technique without intravenous contrast. COMPARISON:  No pertinent prior exam. FINDINGS: MRI  HEAD Brain: There is reduced diffusion involving the left corona radiata and basal ganglia. No evidence of intracranial hemorrhage. There is no intracranial mass or mass effect. Ventricles and sulci are normal in size and configuration, noting cavum septum pellucidum et vergae. Patchy foci of T2 hyperintensity in the supratentorial white matter are nonspecific but may reflect mild chronic microvascular ischemic changes. There is no hydrocephalus or extra-axial fluid collection. Vascular: Major vessel flow voids at the skull base are preserved. Skull and upper cervical spine: Normal marrow signal is preserved. Sinuses/Orbits: Paranasal sinuses are aerated. Orbits are unremarkable. Other: Sella is unremarkable.  Mastoid air cells are clear. MRA HEAD Intracranial internal carotid arteries are patent. Middle and anterior cerebral arteries are patent. Left A1 ACA is dominant. Intracranial vertebral arteries, basilar artery, posterior cerebral arteries are patent. Left posterior communicating artery is present. There is no significant stenosis or aneurysm. 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  No hemorrhage. Mild chronic microvascular ischemic changes. No proximal intracranial vessel occlusion or significant stenosis. Electronically Signed   By: Macy Mis M.D.   On: 10/16/2020 20:34   MR BRAIN WO CONTRAST  Result Date: 10/16/2020 CLINICAL DATA:  Neuro deficit, acute, stroke suspected EXAM: MRI HEAD WITHOUT CONTRAST MRA HEAD WITHOUT CONTRAST TECHNIQUE: Multiplanar, multi-echo pulse sequences of the brain and surrounding structures were acquired without intravenous contrast. Angiographic images of the Circle of Willis were acquired using MRA technique without intravenous contrast. COMPARISON:  No pertinent prior exam. FINDINGS: MRI HEAD Brain: There is reduced diffusion involving the left corona radiata and basal ganglia. No evidence of intracranial hemorrhage. There is no intracranial  mass or mass effect. Ventricles and sulci are normal in size and configuration, noting cavum septum pellucidum et vergae. Patchy foci of T2 hyperintensity in the supratentorial white matter are nonspecific but may reflect mild chronic microvascular ischemic changes. There is no hydrocephalus or extra-axial fluid collection. Vascular: Major vessel flow voids at the skull base are preserved. Skull and upper cervical spine: Normal marrow signal is preserved. Sinuses/Orbits: Paranasal sinuses are aerated. Orbits are unremarkable. Other: Sella is unremarkable.  Mastoid air cells are clear. MRA HEAD  Intracranial internal carotid arteries are patent. Middle and anterior cerebral arteries are patent. Left A1 ACA is dominant. Intracranial vertebral arteries, basilar artery, posterior cerebral arteries are patent. Left posterior communicating artery is present. There is no significant stenosis or aneurysm. 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  No hemorrhage. Mild chronic microvascular ischemic changes. No proximal intracranial vessel occlusion or significant stenosis. Electronically Signed   By: Macy Mis M.D.   On: 10/16/2020 20:34    EKG: I have personally reviewed EKG: shows NSR   Assessment/Plan Principal Problem:   Acute CVA (cerebrovascular accident) ([place]) - with RUE > RLE weakness and expressive aphasia    Acute CVA (cerebrovascular accident) ([place]) - with RUE > RLE weakness and expressive aphasia Admit to observation telemetry bed. Will follow neurology consult recs. They have written for rest of stroke workup. Confirmed full code status with pt, husband and dtr at bedside.  DVT prophylaxis: Lovenox Code Status: Full Code Family Communication: discussed with pt's husband and pt's dtr(hazen) at bedside  Disposition Plan: return home  Consults called: neurology has seen patient in ER  Admission status: Observation, Telemetry bed   Kristopher Oppenheim, DO [facility] 10/16/2020, 10:15 PM

## 2020-10-16 NOTE — ED Provider Notes (Addendum)
[facility] EMERGENCY DEPARTMENT Provider Note   CSN: [id number] Arrival date & time: 10/16/20  1433     History Chief Complaint  Patient presents with   Neurologic Problem    Latoya Wright is a 75 y.o. female.  Patient states that around 1130-11:45 AM she developed some right-sided weakness, speech difficulty.  Had weakness in her arms and legs and the right side of her face.  Could not use her phone to text.  She is having trouble getting words out.  Supposedly by the time EMS got there her symptoms have resolved.  However at the bedside family member states that still feels like she is having some difficulty getting out words and having some slurred speech.  She denies any further right-sided weakness.  The history is provided by the patient.  Neurologic Problem This is a new problem. The current episode started 6 to 12 hours ago. The problem has not changed since onset.Pertinent negatives include no chest pain, no abdominal pain, no headaches and no shortness of breath. Nothing aggravates the symptoms. Nothing relieves the symptoms. She has tried nothing for the symptoms. The treatment provided no relief.      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     Family History  Problem Relation Age of Onset   Heart disease Father    Heart disease Maternal Grandmother    Heart disease Maternal Grandfather    Heart disease Paternal Grandmother    Heart disease Paternal Grandfather    Rheum arthritis Sister    Rheum arthritis Brother     Social History   Tobacco Use   Smoking status: Never   Smokeless tobacco: Never  Substance Use Topics   Alcohol use: No   Drug use: No    Home Medications Prior to Admission medications   Medication Sig Start Date End Date Taking? Authorizing Provider  Biotin 5 MG TABS Take 5 mg by mouth daily.   Yes [provider]  Calcium-Magnesium-Vitamin D (CALCIUM 500 PO) Take 1,000 tablets by mouth daily.   Yes [provider]  Vitamin D, Cholecalciferol, 10 MCG (400 UNIT) CAPS Take 400 Units by mouth daily.   Yes [provider]  triamcinolone ointment (KENALOG) 0.5 % Apply 1 application topically 2 (two) times daily. Patient not taking: No sig reported 08/21/20   Vivi Barrack, MD    Allergies    Dayquil [[id number]]  Review of Systems   Review of Systems  Constitutional:  Negative for chills and fever.  HENT:  Negative for ear pain and sore throat.   Eyes:  Negative for pain and visual disturbance.  Respiratory:  Negative  for cough and shortness of breath.   Cardiovascular:  Negative for chest pain and palpitations.  Gastrointestinal:  Negative for abdominal pain and vomiting.  Genitourinary:  Negative for dysuria and hematuria.  Musculoskeletal:  Negative for arthralgias and back pain.  Skin:  Negative for color change and rash.  Neurological:  Positive for facial asymmetry, speech difficulty and weakness. Negative for seizures, syncope, light-headedness, numbness and headaches.  All other systems reviewed and are negative.  Physical Exam Updated Vital Signs BP 138/75    Pulse 73   Temp 98 F (36.7 C) (Oral)   Resp 13   SpO2 98%   Physical Exam Vitals and nursing note reviewed.  Constitutional:      General: She is not in acute distress.    Appearance: She is well-developed. She is not ill-appearing.  HENT:     Head: Normocephalic and atraumatic.     Nose: Nose normal.     Mouth/Throat:     Mouth: Mucous membranes are moist.  Eyes:     Extraocular Movements: Extraocular movements intact.     Conjunctiva/sclera: Conjunctivae normal.     Pupils: Pupils are equal, round, and reactive to light.  Cardiovascular:     Rate and Rhythm: Normal rate and regular rhythm.     Pulses: Normal pulses.     Heart sounds: No murmur heard. Pulmonary:     Effort: Pulmonary effort is normal. No respiratory distress.     Breath sounds: Normal breath sounds.  Abdominal:     Palpations: Abdomen is soft.     Tenderness: There is no abdominal tenderness.  Musculoskeletal:     Cervical back: Normal range of motion and neck supple.  Skin:    General: Skin is warm and dry.  Neurological:     Mental Status: She is alert. She is disoriented.     Sensory: No sensory deficit.     Motor: Weakness present.     Comments: She appears to have expressive aphasia, trace right-sided facial weakness, trace right upper extremity weakness, may be some ataxia in the right arm but otherwise sensations intact and strength is intact and otherwise cranial nerves appear to be intact    ED Results / Procedures / Treatments   Labs (all labs ordered are listed, but only abnormal results are displayed) Labs Reviewed  RESP PANEL BY RT-PCR (FLU A&B, COVID) ARPGX2 - Abnormal; Notable for the following components:      Result Value   SARS Coronavirus 2 by RT PCR POSITIVE (*)    All other components within normal limits  COMPREHENSIVE METABOLIC PANEL - Abnormal; Notable for the following components:   Glucose, Bld 104 (*)    All other components within normal limits  URINALYSIS, ROUTINE W  REFLEX MICROSCOPIC - Abnormal; Notable for the following components:   Ketones, ur 5 (*)    All other components within normal limits  CBC WITH DIFFERENTIAL/PLATELET  HEMOGLOBIN A1C  LIPID PANEL    EKG EKG Interpretation  Date/Time:  Thursday October 16 2020 14:42:09 EDT Ventricular Rate:  75 PR Interval:  180 QRS Duration: 86 QT Interval:  402 QTC Calculation: 448 R Axis:   80 Text Interpretation: Normal sinus rhythm Right atrial enlargement Nonspecific ST abnormality Abnormal ECG Confirmed by Lennice Sites ([id number]) on 10/16/2020 6:31:12 PM  Radiology CT HEAD WO CONTRAST (5MM)  Result Date: 10/16/2020 CLINICAL DATA:  TIA. EXAM: CT HEAD WITHOUT CONTRAST TECHNIQUE: Contiguous axial images were obtained from the base of the skull through the  vertex without intravenous contrast. COMPARISON:  October 15, 2014 FINDINGS: Brain: No evidence of acute infarction, hemorrhage, hydrocephalus, extra-axial collection or mass lesion/mass effect. Cavum septum pellucidum et vergae is again seen. Vascular: No hyperdense vessel or unexpected calcification. Skull: Normal. Negative for fracture or focal lesion. Sinuses/Orbits: No acute finding. Other: None. IMPRESSION: No acute intracranial abnormality. Electronically Signed   By: Virgina Norfolk M.D.   On: 10/16/2020 18:07   MR ANGIO HEAD WO CONTRAST  Result Date: 10/16/2020 CLINICAL DATA:  Neuro deficit, acute, stroke suspected EXAM: MRI HEAD WITHOUT CONTRAST MRA HEAD WITHOUT CONTRAST TECHNIQUE: Multiplanar, multi-echo pulse sequences of the brain and surrounding structures were acquired without intravenous contrast. Angiographic images of the Circle of Willis were acquired using MRA technique without intravenous contrast. COMPARISON:  No pertinent prior exam. FINDINGS: MRI HEAD Brain: There is reduced diffusion involving the left corona radiata and basal ganglia. No evidence of intracranial hemorrhage. There is no intracranial mass or mass effect.  Ventricles and sulci are normal in size and configuration, noting cavum septum pellucidum et vergae. Patchy foci of T2 hyperintensity in the supratentorial white matter are nonspecific but may reflect mild chronic microvascular ischemic changes. There is no hydrocephalus or extra-axial fluid collection. Vascular: Major vessel flow voids at the skull base are preserved. Skull and upper cervical spine: Normal marrow signal is preserved. Sinuses/Orbits: Paranasal sinuses are aerated. Orbits are unremarkable. Other: Sella is unremarkable.  Mastoid air cells are clear. MRA HEAD Intracranial internal carotid arteries are patent. Middle and anterior cerebral arteries are patent. Left A1 ACA is dominant. Intracranial vertebral arteries, basilar artery, posterior cerebral arteries are patent. Left posterior communicating artery is present. There is no significant stenosis or aneurysm. 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  No hemorrhage. Mild chronic microvascular ischemic changes. No proximal intracranial vessel occlusion or significant stenosis. Electronically Signed   By: Macy Mis M.D.   On: 10/16/2020 20:34   MR BRAIN WO CONTRAST  Result Date: 10/16/2020 CLINICAL DATA:  Neuro deficit, acute, stroke suspected EXAM: MRI HEAD WITHOUT CONTRAST MRA HEAD WITHOUT CONTRAST TECHNIQUE: Multiplanar, multi-echo pulse sequences of the brain and surrounding structures were acquired without intravenous contrast. Angiographic images of the Circle of Willis were acquired using MRA technique without intravenous contrast. COMPARISON:  No pertinent prior exam. FINDINGS: MRI HEAD Brain: There is reduced diffusion involving the left corona radiata and basal ganglia. No evidence of intracranial hemorrhage. There is no intracranial mass or mass effect. Ventricles and sulci are normal in size and configuration, noting cavum septum pellucidum et vergae. Patchy foci of T2 hyperintensity in the supratentorial  white matter are nonspecific but may reflect mild chronic microvascular ischemic changes. There is no hydrocephalus or extra-axial fluid collection. Vascular: Major vessel flow voids at the skull base are preserved. Skull and upper cervical spine: Normal marrow signal is preserved. Sinuses/Orbits: Paranasal sinuses are aerated. Orbits are unremarkable. Other: Sella is unremarkable.  Mastoid air cells are clear. MRA HEAD Intracranial internal carotid arteries are patent. Middle and anterior cerebral arteries are patent. Left A1 ACA is dominant. Intracranial vertebral arteries, basilar artery, posterior cerebral arteries are patent. Left posterior communicating artery is present. There is no significant stenosis or aneurysm. 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  No hemorrhage. Mild chronic microvascular ischemic changes. No proximal intracranial vessel occlusion or significant stenosis. Electronically Signed   By: Macy Mis M.D.   On: 10/16/2020 20:34    Procedures .Critical Care Performed by: Lennice Sites, DO  Authorized by: Lennice Sites, DO   Critical care provider statement:    Critical care time (minutes):  35   Medications Ordered in ED Medications  aspirin tablet 325 mg (has no administration in time range)    Followed by  aspirin EC tablet 81 mg (has no administration in time range)  clopidogrel (PLAVIX) tablet 300 mg (has no administration in time range)    Followed by  clopidogrel (PLAVIX) tablet 75 mg (has no administration in time range)  atorvastatin (LIPITOR) tablet 40 mg (has no administration in time range)    ED Course  I have reviewed the triage vital signs and the nursing notes.  Pertinent labs & imaging results that were available during my care of the patient were reviewed by me and considered in my medical decision making (see chart for details).    MDM Rules/Calculators/A&P                           Latoya Wright is here for  strokelike symptoms.  Normal vitals.  No fever.  At shortly before 12 she had right-sided weakness, difficulty with speech.  Supposedly that had fully resolved after about an hour.  Upon the time I am evaluating her this is about 6 or 7 hours after her last known normal and I have noticed some aphasia, mild right-sided facial weakness, slurred speech, mild right upper extremity weakness and some mild ataxia in the right upper extremity.  Talked with neurology on the phone and they recommending calling code stroke to rule out LVO.  Not sure when her last known normal was at this time as history is now more complicated.  She has already had head CT and basic labs prior to my evaluation after she was cleared neurologically in triage.  Head CT is unremarkable.  Basic labs are unremarkable.  Ultimately decision was made to take patient directly to MRI for imaging and patient was found to have acute infarct of her left corona radiata and basal ganglia.  There is no hemorrhage.  No significant stenosis.  After neurology reviewed imaging did not think that tPA was the safe decision at this time.  Not a candidate for thrombectomy.  At that time patient to be admitted to medicine service for further stroke care.  Aspirin and Plavix were given.  Incidentally she tested positive for COVID.  This chart was dictated using voice recognition software.  Despite best efforts to proofread,  errors can occur which can change the documentation meaning.   Final Clinical Impression(s) / ED Diagnoses Final diagnoses:  Cerebral infarction, unspecified mechanism [facility])  COVID-19    Rx / DC Orders ED Discharge Orders     None        Lennice Sites, DO 10/16/20 2219    Lennice Sites, DO 10/16/20 2220

## 2020-10-16 NOTE — ED Notes (Signed)
2 RNs made multiple attempts to establish adequate IV access for CT perfusion study.

## 2020-10-16 NOTE — ED Notes (Signed)
Pt had difficulty swallowing PO aspirin as evidenced by prolonged period of coughing immediately after attempting to swallow pill lasting 2-3 minutes.  Pt was able to speak a few minutes after episode; voice continues to be slurred but was clear, not wet-sounding.  MD and Neurologist made aware via secure chat.  Order received for NPO status.  Pt made aware.

## 2020-10-16 NOTE — Assessment & Plan Note (Signed)
Admit to observation telemetry bed. Will follow neurology consult recs. They have written for rest of stroke workup. Confirmed full code status with pt, husband and dtr at bedside.

## 2020-10-16 NOTE — Consult Note (Addendum)
Neurology Consultation Reason for Consult: Code stroke Requesting Physician: Lennice Sites  CC: Aphasia and right sided weakness  History is obtained from: Patient, husband at bedside and chart review as well as Dr. Ronnald Nian  HPI: Latoya Wright is a 75 y.o. female with a past medical history significant for good health and good access to healthcare, presenting with sudden onset aphasia and right-sided weakness.  She was out working in the yard when she began to have very significant right-sided weakness, and language difficulty, to the point that she was unable to use her phone.  She was eventually able to get to the car and blow the horn repeatedly until someone called EMS.  By the time of EMS arrival her symptoms were rapidly resolving and she was asymptomatic at the time of presentation to the ED.  Unfortunately while awaiting further evaluation, she began to have recurrent symptoms.  On discovery of this, code stroke was reactivated.  Imaging was completed as documented below under imaging section.  Of note the patient did not tolerate full MRI and therefore MRA neck was not performed  LKW: 11:30 AM tPA given?: No, out of the window and FLAIR changes on MRI IA performed?: No, no LVO and NIH stroke scale does not meet standard threshold for treatment Premorbid modified rankin scale:      0 - No symptoms.  ROS: All other review of systems was negative except as noted in the HPI.   Past Medical History:  Diagnosis Date   Allergy    Arthritis    Past Surgical History:  Procedure Laterality Date   TOTAL HIP ARTHROPLASTY 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   Procedure: TOTAL HIP ARTHROPLASTY ANTERIOR APPROACH;  Surgeon: Mcarthur Rossetti, MD;  Location: [facility];  Service: Orthopedics;  Laterality: Left;   Current Outpatient Medications  Medication Instructions   Biotin 5 mg, Oral, Daily   Calcium-Magnesium-Vitamin D (CALCIUM 500 PO) 3 tablets, Oral, Daily   triamcinolone ointment (KENALOG) 0.5 % 1  application, Topical, 2 times daily   Vitamin D (Cholecalciferol) 400 Units, Oral, Daily     Family History  Problem Relation Age of Onset   Heart disease Father    Heart disease Maternal Grandmother    Heart disease Maternal Grandfather    Heart disease Paternal Grandmother    Heart disease Paternal Grandfather    Rheum arthritis Sister    Rheum arthritis Brother     Social History:  reports that she has never smoked. She has never used smokeless tobacco. She reports that she does not drink alcohol and does not use drugs.   Exam: Current vital signs: BP (!) 177/73   Pulse 82   Temp 98.7 F (37.1 C) (Oral)   Resp 16   SpO2 100%  Vital signs in last 24 hours: Temp:  [98.7 F (37.1 C)] 98.7 F (37.1 C) (09/22 1438) Pulse Rate:  [76-82] 82 (09/22 1845) Resp:  [13-18] 16 (09/22 1845) BP: (134-177)/(68-79) 177/73 (09/22 1845) SpO2:  [100 %] 100 % (09/22 1845)   Physical Exam  Constitutional: Appears well-developed and well-nourished.  Thin and fit Psych: Affect appropriate to situation, cooperative but extremely anxious Eyes: No scleral injection HENT: No oropharyngeal obstruction.  MSK: no joint deformities.  Cardiovascular: Normal rate and regular rhythm.  Respiratory: Effort normal, non-labored breathing, increased rate at times of anxiety but no wheezing GI: Soft.  No distension. There is no tenderness.  Skin: Warm dry and intact visible skin  Neuro: Mental Status: Patient is awake, alert,  oriented to person, place, month, year, and situation. Patient is able to give a clear and coherent history. She makes slight paraphasic/phonemic errors (such as stating touch instead of teach) Cranial Nerves: II: Visual Fields are full. Pupils are equal, round, and reactive to light.   III,IV, VI: EOMI without ptosis or diploplia.  V: Facial sensation is symmetric to temperature VII: Facial movement is notable for a right facial droop.  VIII: hearing is intact to voice X:  Uvula elevates symmetrically XI: Shoulder shrug is symmetric. XII: tongue is midline without atrophy or fasciculations.  Motor: Tone is normal. Bulk is normal. 5/5 strength was present in all four extremities, other than right deltoid 4/5 and left hip 4/5  Sensory: Sensation is symmetric to light touch in the arms and legs. Deep Tendon Reflexes: 3+ and symmetric in the biceps and patellae.  Cerebellar: FNF and HKS are intact bilaterally  NIHSS total 4 --> 3 Score breakdown: On my initial evaluation, scored one-point for right facial droop, one-point for limb ataxia (right upper extremity, resolved on reevaluation after MRI), mild to moderate aphasia    I have reviewed labs in epic and the results pertinent to this consultation are:  Basic Metabolic Panel: Recent Labs  Lab 10/14/20 1507 10/16/20 1441  NA 140 140  K 4.6 3.9  CL 106 105  CO2 28 26  GLUCOSE 79 104*  BUN 20 16  CREATININE 1.06 0.94  CALCIUM 9.3 9.2    CBC: Recent Labs  Lab 10/14/20 1507 10/16/20 1441  WBC 7.4 9.3  NEUTROABS  --  7.5  HGB 12.6 13.0  HCT 38.6 40.4  MCV 88.9 91.4  PLT 261.0 281   No results found for: HGBA1C  Lab Results  Component Value Date   CHOL 215 (H) 10/14/2020   HDL 78.90 10/14/2020   LDLCALC 119 (H) 10/14/2020   TRIG 83.0 10/14/2020   CHOLHDL 3 10/14/2020    I have reviewed the images obtained:   Imaging personally reviewed, and head CT without acute intracranial process, aspects 10, possible hyperdense left MCA sign. Attempted to obtain CTA and CT perfusion given large vessel occlusion sign of aphasia, however IV access was extremely challenging.  Therefore went to MRI.  This revealed a small stroke but did have associated FLAIR change as well as a larger area of diffusion abnormality without associated FLAIR change on my read, radiology read as below.  MRA did not reveal a large vessel occlusion. MRI brain:  "Acute infarct left corona radiata and basal ganglia.  No  hemorrhage.  Mild chronic microvascular ischemic changes.  No proximal intracranial vessel occlusion or significant stenosis."    Impression: This is a 75 year old woman with no significant past medical history presenting with acute left MCA territory stroke with exam notable for mild to moderate aphasia, right facial droop, mild dysarthria.  Given location and clinical course, suspect an embolic etiology.  Based on review of imaging, I believe she had a larger embolic event that mostly resolved, to the point that she was asymptomatic and would not have met criteria for code stroke activation, but did leave a small residual stroke that shows FLAIR change on MRI.  Subsequently while in the waiting room she had a second event, which I would have considered treating with TNK if not for the FLAIR changes associated with the first event that would put her at high risk of bleeding.  Recommendations: # Left corona radiata and basal ganglia stroke - Stroke labs HgbA1c, fasting lipid  panel completed recently as above and will start atorvastatin 40 mg daily - MRI brain completed emergently as above on my recommendations - Frequent neuro checks - Echocardiogram - Carotid dopplers given IV access issues for CTA and patient difficulty tolerating MRI - Prophylactic therapy-Antiplatelet med: Aspirin - dose 342m PO or 3051mPR, followed by 81 mg daily - Plavix 300 mg load with 75 mg daily for 21 - 90 day course (to be determined based on vessel imaging) - Risk factor modification - Telemetry monitoring - Blood pressure goal   - Permissive hypertension to 220/120 for 24-48 hours - PT consult, OT consult, Speech consult - Stroke team to follow  [id number] 7 PM to 7 AM, outside of these hours please call Neurologist on call as listed on Amion.   Total critical care time: 60 minutes   Critical care time was exclusive of separately billable  procedures and treating other patients.   Critical care was necessary to treat or prevent imminent or life-threatening deterioration.   Critical care was time spent personally by me on the following activities: development of treatment plan with patient and/or surrogate as well as nursing, discussions with consultants/primary team, evaluation of patient's response to treatment, examination of patient, obtaining history from patient or surrogate, ordering and performing treatments and interventions, ordering and review of laboratory studies, ordering and review of radiographic studies, and re-evaluation of patient's condition as needed, as documented above.

## 2020-10-16 NOTE — ED Triage Notes (Signed)
Pt BIB [facility] from home, per pt was working in the yard, had sudden onset right sided weakness, unable to use phone, upon EMS arrival pt symptoms resolved, symptoms started at 1320, resolved 30 min after.

## 2020-10-16 NOTE — Subjective & Objective (Addendum)
CC: difficulty speaking HPI: 75-year-old female with no prior medical history presents to the ER today with difficulty speaking.  Patient was outside in her garden doing some work.  She had sudden onset of aphasia and some right-sided weakness.  She was unable to use her phone.  She was able to get to her car and honked the horn until somebody called EMS.  On the ride to the ER, her symptoms improved dramatically.  While she was waiting to be evaluated in the waiting room, she began having further symptoms.  Patient's symptoms included aphasia, right arm and right leg weakness.  Her symptoms started around 12:00 today.  Her work-up in the ER included CT scan but did also have a MRI/MRA brain.  This did show 2 areas of stroke.  Neurology has seen the patient.  She is outside the tPA window.  Neurology has left recommendations for stroke work-up.  She will be admitted by the hospitalist for further care.  Met with pt, pt's husband and pt's dtr haven at bedside.  Pt unable to give HPI or ROS due to expressive aphasia. 

## 2020-10-17 ENCOUNTER — Observation Stay (HOSPITAL_COMMUNITY): Payer: Medicare PPO

## 2020-10-17 DIAGNOSIS — I6381 Other cerebral infarction due to occlusion or stenosis of small artery: Secondary | ICD-10-CM | POA: Diagnosis present

## 2020-10-17 DIAGNOSIS — R636 Underweight: Secondary | ICD-10-CM | POA: Diagnosis present

## 2020-10-17 DIAGNOSIS — R471 Dysarthria and anarthria: Secondary | ICD-10-CM | POA: Diagnosis present

## 2020-10-17 DIAGNOSIS — Z8673 Personal history of transient ischemic attack (TIA), and cerebral infarction without residual deficits: Secondary | ICD-10-CM | POA: Diagnosis not present

## 2020-10-17 DIAGNOSIS — I63512 Cerebral infarction due to unspecified occlusion or stenosis of left middle cerebral artery: Secondary | ICD-10-CM | POA: Diagnosis present

## 2020-10-17 DIAGNOSIS — M810 Age-related osteoporosis without current pathological fracture: Secondary | ICD-10-CM | POA: Diagnosis present

## 2020-10-17 DIAGNOSIS — Z79899 Other long term (current) drug therapy: Secondary | ICD-10-CM | POA: Diagnosis not present

## 2020-10-17 DIAGNOSIS — Z8249 Family history of ischemic heart disease and other diseases of the circulatory system: Secondary | ICD-10-CM | POA: Diagnosis not present

## 2020-10-17 DIAGNOSIS — H538 Other visual disturbances: Secondary | ICD-10-CM | POA: Diagnosis present

## 2020-10-17 DIAGNOSIS — I6389 Other cerebral infarction: Secondary | ICD-10-CM | POA: Diagnosis not present

## 2020-10-17 DIAGNOSIS — G8191 Hemiplegia, unspecified affecting right dominant side: Secondary | ICD-10-CM | POA: Diagnosis present

## 2020-10-17 DIAGNOSIS — I639 Cerebral infarction, unspecified: Secondary | ICD-10-CM

## 2020-10-17 DIAGNOSIS — Z7902 Long term (current) use of antithrombotics/antiplatelets: Secondary | ICD-10-CM | POA: Diagnosis not present

## 2020-10-17 DIAGNOSIS — E785 Hyperlipidemia, unspecified: Secondary | ICD-10-CM | POA: Diagnosis present

## 2020-10-17 DIAGNOSIS — Z96642 Presence of left artificial hip joint: Secondary | ICD-10-CM | POA: Diagnosis present

## 2020-10-17 DIAGNOSIS — Z681 Body mass index (BMI) 19 or less, adult: Secondary | ICD-10-CM | POA: Diagnosis not present

## 2020-10-17 DIAGNOSIS — U071 COVID-19: Secondary | ICD-10-CM

## 2020-10-17 DIAGNOSIS — R29704 NIHSS score 4: Secondary | ICD-10-CM | POA: Diagnosis present

## 2020-10-17 DIAGNOSIS — R270 Ataxia, unspecified: Secondary | ICD-10-CM | POA: Diagnosis present

## 2020-10-17 DIAGNOSIS — R4701 Aphasia: Secondary | ICD-10-CM | POA: Diagnosis present

## 2020-10-17 DIAGNOSIS — Z888 Allergy status to other drugs, medicaments and biological substances status: Secondary | ICD-10-CM | POA: Diagnosis not present

## 2020-10-17 DIAGNOSIS — R29810 Facial weakness: Secondary | ICD-10-CM | POA: Diagnosis present

## 2020-10-17 DIAGNOSIS — I10 Essential (primary) hypertension: Secondary | ICD-10-CM | POA: Diagnosis present

## 2020-10-17 LAB — CBC WITH DIFFERENTIAL/PLATELET
Abs Immature Granulocytes: 0.02 10*3/uL (ref 0.00–0.07)
Basophils Absolute: 0 10*3/uL (ref 0.0–0.1)
Basophils Relative: 1 %
Eosinophils Absolute: 0 10*3/uL (ref 0.0–0.5)
Eosinophils Relative: 1 %
HCT: 38.7 % (ref 36.0–46.0)
Hemoglobin: 12.6 g/dL (ref 12.0–15.0)
Immature Granulocytes: 0 %
Lymphocytes Relative: 17 %
Lymphs Abs: 1.2 10*3/uL (ref 0.7–4.0)
MCH: 29.2 pg (ref 26.0–34.0)
MCHC: 32.6 g/dL (ref 30.0–36.0)
MCV: 89.8 fL (ref 80.0–100.0)
Monocytes Absolute: 0.7 10*3/uL (ref 0.1–1.0)
Monocytes Relative: 10 %
Neutro Abs: 5 10*3/uL (ref 1.7–7.7)
Neutrophils Relative %: 71 %
Platelets: 250 10*3/uL (ref 150–400)
RBC: 4.31 MIL/uL (ref 3.87–5.11)
RDW: 13.8 % (ref 11.5–15.5)
WBC: 7 10*3/uL (ref 4.0–10.5)
nRBC: 0 % (ref 0.0–0.2)

## 2020-10-17 LAB — LIPID PANEL
Cholesterol: 212 mg/dL — ABNORMAL HIGH (ref 0–200)
HDL: 85 mg/dL (ref >40)
LDL Cholesterol: 121 mg/dL — ABNORMAL HIGH (ref 0–99)
Total CHOL/HDL Ratio: 2.5 RATIO
Triglycerides: 29 mg/dL (ref <150)
VLDL: 6 mg/dL (ref 0–40)

## 2020-10-17 LAB — ECHOCARDIOGRAM LIMITED
Area-P 1/2: 3.31 cm2
S' Lateral: 2 cm

## 2020-10-17 LAB — COMPREHENSIVE METABOLIC PANEL
ALT: 13 U/L (ref 0–44)
AST: 18 U/L (ref 15–41)
Albumin: 3.5 g/dL (ref 3.5–5.0)
Alkaline Phosphatase: 45 U/L (ref 38–126)
Anion gap: 7 (ref 5–15)
BUN: 14 mg/dL (ref 8–23)
CO2: 25 mmol/L (ref 22–32)
Calcium: 8.5 mg/dL — ABNORMAL LOW (ref 8.9–10.3)
Chloride: 108 mmol/L (ref 98–111)
Creatinine, Ser: 0.84 mg/dL (ref 0.44–1.00)
GFR, Estimated: >60 mL/min (ref >60)
Glucose, Bld: 89 mg/dL (ref 70–99)
Potassium: 3.8 mmol/L (ref 3.5–5.1)
Sodium: 140 mmol/L (ref 135–145)
Total Bilirubin: 1.2 mg/dL (ref 0.3–1.2)
Total Protein: 6.1 g/dL — ABNORMAL LOW (ref 6.5–8.1)

## 2020-10-17 LAB — HEMOGLOBIN A1C
Hgb A1c MFr Bld: 5.3 % (ref 4.8–5.6)
Mean Plasma Glucose: 105.41 mg/dL

## 2020-10-17 MED ORDER — SODIUM CHLORIDE 0.9 % IV SOLN
200.0000 mg | Freq: Once | INTRAVENOUS | Status: AC
  Administered 2020-10-17: 200 mg via INTRAVENOUS
  Filled 2020-10-17: qty 40

## 2020-10-17 MED ORDER — SODIUM CHLORIDE 0.9 % IV SOLN
100.0000 mg | Freq: Every day | INTRAVENOUS | Status: AC
  Administered 2020-10-18 – 2020-10-19 (×2): 100 mg via INTRAVENOUS
  Filled 2020-10-17 (×2): qty 20

## 2020-10-17 MED ORDER — ASPIRIN 300 MG RE SUPP
300.0000 mg | Freq: Once | RECTAL | Status: DC
  Filled 2020-10-17: qty 1

## 2020-10-17 MED ORDER — LORAZEPAM 0.5 MG PO TABS
0.2500 mg | ORAL_TABLET | Freq: Three times a day (TID) | ORAL | Status: DC | PRN
  Administered 2020-10-18: 0.25 mg via ORAL
  Filled 2020-10-17: qty 1

## 2020-10-17 NOTE — Progress Notes (Signed)
Pharmacy Antibiotic Note  Latoya Wright is a 75 y.o. female admitted on 10/16/2020 with stroke.  Pt also tested positive for COVID. Pharmacy has been consulted for remdesivir dosing.  WBC 7, afebrile, AST/ALT 18/13  Plan: Remdesivir 200 mg IV X 1, followed by remdesivir 100 mg IV daily X 2 days Monitor LFTs daily while on remdesivir  Temp (24hrs), Avg:97.8 F (36.6 C), Min:97.6 F (36.4 C), Max:98 F (36.7 C)  Recent Labs  Lab 10/14/20 1507 10/16/20 1441 10/17/20 0325  WBC 7.4 9.3 7.0  CREATININE 1.06 0.94 0.84    Estimated Creatinine Clearance: 43.3 mL/min (by C-G formula based on SCr of 0.84 mg/dL).    Allergies  Allergen Reactions   Dayquil [Pseudoephedrine-Apap-Dm]     jittery    Microbiology results: 9/22 COVID: positive 9/22 flu A, flu B: negative  Thank you for allowing pharmacy to be a part of this patient's care.  Gillermina Hu, PharmD, BCPS, [facility] Clinical Pharmacist 10/17/2020 3:11 PM

## 2020-10-17 NOTE — Progress Notes (Signed)
PROGRESS NOTE    Latoya Wright  TOI:[id number] DOB: [birth year] DOA: 10/16/2020 PCP: Vivi Barrack, MD    Chief Complaint  Patient presents with   Neurologic Problem    Brief admission narrative:  As per H&P written by Dr. Bridgett Larsson on 10/16/2020 75 year old female with no prior medical history presents to the ER today with difficulty speaking.  Patient was outside in her garden doing some work.  She had sudden onset of aphasia and some right-sided weakness.  She was unable to use her phone.  She was able to get to her car and honked the horn until somebody called EMS.  On the ride to the ER, her symptoms improved dramatically.  While she was waiting to be evaluated in the waiting room, she began having further symptoms.   Patient's symptoms included aphasia, right arm and right leg weakness.   Her symptoms started around 12:00 today.   Her work-up in the ER included CT scan but did also have a MRI/MRA brain.  This did show 2 areas of stroke.  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 -Patient has been started on Lipitor for an LDL of 121 and advised to follow heart healthy diet -Allowing permissive hypertension coronary and looking to initiate antihypertensive regimen with the use of lisinopril or ARB after the next 5 days. -A1c 5.3 -Continue physical therapy, Occupational Therapy and speech therapy for cognitive and language.  Recommendations given for CIR who has been contacted. -Family in agreement.  2-COVID positive -Patient vaccinated and boosted -Asymptomatic/incidental finding -In agreement to receive 3 treatments of remdesivir. -Continue supportive care  3-hypertension -As mentioned above no  antihypertensive regimen prior to admission; will recommend the use of lisinopril for ARB at the next 5 days of allowing permissive hypertension. -Heart healthy/low-sodium diet encouraged.   DVT prophylaxis: Lovenox Code Status: Full code. Family Communication: Daughter and husband at bedside. Disposition:   Status is: Inpatient  Remains inpatient appropriate because:IV treatments appropriate due to intensity of illness or inability to take PO  Dispo: The patient is from:  Home              Anticipated d/c is to: CIR              Patient currently patient is not medically stable for discharge.    Difficult to place patient No       Consultants:  Neurology service CIR  Procedures:  See below for x-ray reports 2D echo: Pending Carotid Dopplers: 1-39 bilateral ICA stenosis; antegrade flow vertebrally.  Antimicrobials:  Remdesivir (incidental finding; planning for 3 days treatment).  Subjective: Right-sided weakness, positive aphasia and dysarthria; positive pronator drift.  Patient afebrile, no chest pain, no nausea, no vomiting.  No shortness of breath.  Objective: Vitals:   10/17/20 0300 10/17/20 0500 10/17/20 0741 10/17/20 0742  BP: (!) 127/92 (!) 149/74 (!) 131/97 (!) 149/63  Pulse: 65  76 77  Resp: 14 13 18 17   Temp:      TempSrc:      SpO2: 98%  100% 100%    Intake/Output Summary (Last 24 hours) at 10/17/2020 1510 Last data filed at 10/17/2020 1335 Gross per 24 hour  Intake --  Output 325 ml  Net -325 ml  There were no vitals filed for this visit.  Examination:  General exam: Afebrile, no chest pain, no nausea, no vomiting.  Patient reports to be hungry.  Still demonstrating aphasia and some dysarthria.  Right-sided weakness appreciated and positive pronator drift. Respiratory system: Clear to auscultation. Respiratory effort normal.  No using accessory muscles.  Good saturation on room air. Cardiovascular system: S1 & S2 heard, RRR. No JVD, murmurs,  rubs, gallops or clicks. No pedal edema. Gastrointestinal system: Abdomen is nondistended, soft and nontender. No organomegaly or masses felt. Normal bowel sounds heard. Central nervous system: Alert and oriented.  Positive dysarthria, positive pronator drift, decreased right side strength (3 out of 5 upper extremity and lower extremity); positive expressive aphasia and right-sided facial droop. Extremities: Symmetric 5 x 5 power.  No cyanosis or clubbing. Skin: No rashes, no petechiae. Psychiatry: Judgement and insight appear normal. Mood & affect appropriate.     Data Reviewed: I have personally reviewed following labs and imaging studies  CBC: Recent Labs  Lab 10/14/20 1507 10/16/20 1441 10/17/20 0325  WBC 7.4 9.3 7.0  NEUTROABS  --  7.5 5.0  HGB 12.6 13.0 12.6  HCT 38.6 40.4 38.7  MCV 88.9 91.4 89.8  PLT 261.0 281 937    Basic Metabolic Panel: Recent Labs  Lab 10/14/20 1507 10/16/20 1441 10/17/20 0325  NA 140 140 140  K 4.6 3.9 3.8  CL 106 105 108  CO2 28 26 25   GLUCOSE 79 104* 89  BUN 20 16 14   CREATININE 1.06 0.94 0.84  CALCIUM 9.3 9.2 8.5*    GFR: Estimated Creatinine Clearance: 43.3 mL/min (by C-G formula based on SCr of 0.84 mg/dL).  Liver Function Tests: Recent Labs  Lab 10/14/20 1507 10/16/20 1441 10/17/20 0325  AST 19 19 18   ALT 13 14 13   ALKPHOS 51 55 45  BILITOT 0.5 0.8 1.2  PROT 6.9 6.9 6.1*  ALBUMIN 4.2 4.1 3.5    CBG: No results for input(s): GLUCAP in the last 168 hours.   Recent Results (from the past 240 hour(s))  Resp Panel by RT-PCR (Flu A&B, Covid) Nasopharyngeal Swab     Status: Abnormal   Collection Time: 10/16/20  6:43 PM   Specimen: Nasopharyngeal Swab; Nasopharyngeal(NP) swabs in vial transport medium  Result Value Ref Range Status   SARS Coronavirus 2 by RT PCR POSITIVE (A) NEGATIVE Final    Comment: RESULT CALLED TO, READ BACK BY AND VERIFIED WITH: RN COURTNEY L. 10/16/20@21 :17 BY TW (NOTE) SARS-CoV-2 target nucleic  acids are DETECTED.  The SARS-CoV-2 RNA is generally detectable in upper respiratory specimens during the acute phase of infection. Positive results are indicative of the presence of the identified virus, but do not rule out bacterial infection or co-infection with other pathogens not detected by the test. Clinical correlation with patient history and other diagnostic information is necessary to determine patient infection status. The expected result is Negative.  Fact Sheet for Patients: EntrepreneurPulse.com.au  Fact Sheet for Healthcare Providers: IncredibleEmployment.be  This test is not yet approved or cleared by the [place] FDA and  has been authorized for detection and/or diagnosis of SARS-CoV-2 by FDA under an Emergency Use Authorization (EUA).  This EUA will remain in effect (meaning this test can b e used) for the duration of  the COVID-19 declaration under Section 564(b)(1) of the Act, 21 U.S.C. section 360bbb-3(b)(1), unless the authorization is terminated or revoked sooner.     Influenza A by PCR NEGATIVE NEGATIVE Final   Influenza  B by PCR NEGATIVE NEGATIVE Final    Comment: (NOTE) The Xpert Xpress SARS-CoV-2/FLU/RSV plus assay is intended as an aid in the diagnosis of influenza from Nasopharyngeal swab specimens and should not be used as a sole basis for treatment. Nasal washings and aspirates are unacceptable for Xpert Xpress SARS-CoV-2/FLU/RSV testing.  Fact Sheet for Patients: EntrepreneurPulse.com.au  Fact Sheet for Healthcare Providers: IncredibleEmployment.be  This test is not yet approved or cleared by the [place] FDA and has been authorized for detection and/or diagnosis of SARS-CoV-2 by FDA under an Emergency Use Authorization (EUA). This EUA will remain in effect (meaning this test can be used) for the duration of the COVID-19 declaration under Section 564(b)(1) of  the Act, 21 U.S.C. section 360bbb-3(b)(1), unless the authorization is terminated or revoked.  Performed at [facility], [place] [address] [place], [place] [address]      Radiology Studies: CT HEAD WO CONTRAST (5MM)  Result Date: 10/16/2020 CLINICAL DATA:  TIA. EXAM: CT HEAD WITHOUT CONTRAST TECHNIQUE: Contiguous axial images were obtained from the base of the skull through the vertex without intravenous contrast. COMPARISON:  October 15, 2014 FINDINGS: Brain: No evidence of acute infarction, hemorrhage, hydrocephalus, extra-axial collection or mass lesion/mass effect. Cavum septum pellucidum et vergae is again seen. Vascular: No hyperdense vessel or unexpected calcification. Skull: Normal. Negative for fracture or focal lesion. Sinuses/Orbits: No acute finding. Other: None. IMPRESSION: No acute intracranial abnormality. Electronically Signed   By: Virgina Norfolk M.D.   On: 10/16/2020 18:07   MR ANGIO HEAD WO CONTRAST  Result Date: 10/16/2020 CLINICAL DATA:  Neuro deficit, acute, stroke suspected EXAM: MRI HEAD WITHOUT CONTRAST MRA HEAD WITHOUT CONTRAST TECHNIQUE: Multiplanar, multi-echo pulse sequences of the brain and surrounding structures were acquired without intravenous contrast. Angiographic images of the Circle of Willis were acquired using MRA technique without intravenous contrast. COMPARISON:  No pertinent prior exam. FINDINGS: MRI HEAD Brain: There is reduced diffusion involving the left corona radiata and basal ganglia. No evidence of intracranial hemorrhage. There is no intracranial mass or mass effect. Ventricles and sulci are normal in size and configuration, noting cavum septum pellucidum et vergae. Patchy foci of T2 hyperintensity in the supratentorial white matter are nonspecific but may reflect mild chronic microvascular ischemic changes. There is no hydrocephalus or extra-axial fluid collection. Vascular: Major vessel flow voids at the skull base are preserved. Skull  and upper cervical spine: Normal marrow signal is preserved. Sinuses/Orbits: Paranasal sinuses are aerated. Orbits are unremarkable. Other: Sella is unremarkable.  Mastoid air cells are clear. MRA HEAD Intracranial internal carotid arteries are patent. Middle and anterior cerebral arteries are patent. Left A1 ACA is dominant. Intracranial vertebral arteries, basilar artery, posterior cerebral arteries are patent. Left posterior communicating artery is present. There is no significant stenosis or aneurysm. 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  No hemorrhage. Mild chronic microvascular ischemic changes. No proximal intracranial vessel occlusion or significant stenosis. Electronically Signed   By: Macy Mis M.D.   On: 10/16/2020 20:34   MR BRAIN WO CONTRAST  Result Date: 10/16/2020 CLINICAL DATA:  Neuro deficit, acute, stroke suspected EXAM: MRI HEAD WITHOUT CONTRAST MRA HEAD WITHOUT CONTRAST TECHNIQUE: Multiplanar, multi-echo pulse sequences of the brain and surrounding structures were acquired without intravenous contrast. Angiographic images of the Circle of Willis were acquired using MRA technique without intravenous contrast. COMPARISON:  No pertinent prior exam. FINDINGS: MRI HEAD Brain: There is reduced diffusion involving the left corona radiata and basal ganglia. No  evidence of intracranial hemorrhage. There is no intracranial mass or mass effect. Ventricles and sulci are normal in size and configuration, noting cavum septum pellucidum et vergae. Patchy foci of T2 hyperintensity in the supratentorial white matter are nonspecific but may reflect mild chronic microvascular ischemic changes. There is no hydrocephalus or extra-axial fluid collection. Vascular: Major vessel flow voids at the skull base are preserved. Skull and upper cervical spine: Normal marrow signal is preserved. Sinuses/Orbits: Paranasal sinuses are aerated. Orbits are unremarkable. Other: Sella is  unremarkable.  Mastoid air cells are clear. MRA HEAD Intracranial internal carotid arteries are patent. Middle and anterior cerebral arteries are patent. Left A1 ACA is dominant. Intracranial vertebral arteries, basilar artery, posterior cerebral arteries are patent. Left posterior communicating artery is present. There is no significant stenosis or aneurysm. 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  No hemorrhage. Mild chronic microvascular ischemic changes. No proximal intracranial vessel occlusion or significant stenosis. Electronically Signed   By: Macy Mis M.D.   On: 10/16/2020 20:34   VAS US CAROTID  Result Date: 10/17/2020 Carotid Arterial Duplex Study Patient Name:  Latoya Wright  Date of Exam:   10/17/2020 Medical Rec #: [id number]    Accession #:    [id number] Date of Birth: 12/06/1945    Patient Gender: F Patient Age:   34 years Exam Location:  [facility] Procedure:      VAS US CAROTID Referring Phys: ERIC CHEN --------------------------------------------------------------------------------  Indications: CVA. Performing Technologist: Archie Patten RVS  Examination Guidelines: A complete evaluation includes B-mode imaging, spectral Doppler, color Doppler, and power Doppler as needed of all accessible portions of each vessel. Bilateral testing is considered an integral part of a complete examination. Limited examinations for reoccurring indications may be performed as noted.  Right Carotid Findings: +----------+--------+--------+--------+------------------+--------+           PSV cm/sEDV cm/sStenosisPlaque DescriptionComments +----------+--------+--------+--------+------------------+--------+ CCA Prox  88      16              heterogenous               +----------+--------+--------+--------+------------------+--------+ CCA Distal80      18              heterogenous               +----------+--------+--------+--------+------------------+--------+  ICA Prox  86      22      1-39%   heterogenous               +----------+--------+--------+--------+------------------+--------+ ICA Distal107     30                                         +----------+--------+--------+--------+------------------+--------+ ECA       103     9                                          +----------+--------+--------+--------+------------------+--------+ +----------+--------+-------+--------+-------------------+           PSV cm/sEDV cmsDescribeArm Pressure (mmHG) +----------+--------+-------+--------+-------------------+ [id number]                                         +----------+--------+-------+--------+-------------------+ +---------+--------+--+--------+--+---------+  VertebralPSV cm/s64EDV cm/s14Antegrade +---------+--------+--+--------+--+---------+  Left Carotid Findings: +----------+--------+--------+--------+------------------+--------+           PSV cm/sEDV cm/sStenosisPlaque DescriptionComments +----------+--------+--------+--------+------------------+--------+ CCA Prox  94      21              heterogenous               +----------+--------+--------+--------+------------------+--------+ CCA Distal94      23              heterogenous               +----------+--------+--------+--------+------------------+--------+ ICA Prox  94      28      1-39%   heterogenous               +----------+--------+--------+--------+------------------+--------+ ICA Distal107     38                                         +----------+--------+--------+--------+------------------+--------+ ECA       85                                                 +----------+--------+--------+--------+------------------+--------+ +----------+--------+--------+--------+-------------------+           PSV cm/sEDV cm/sDescribeArm Pressure (mmHG) +----------+--------+--------+--------+-------------------+ [id number]                                           +----------+--------+--------+--------+-------------------+ +---------+--------+--+--------+--+---------+ VertebralPSV cm/s76EDV cm/s16Antegrade +---------+--------+--+--------+--+---------+   Summary: Right Carotid: Velocities in the right ICA are consistent with a 1-39% stenosis. Left Carotid: Velocities in the left ICA are consistent with a 1-39% stenosis. Vertebrals: Bilateral vertebral arteries demonstrate antegrade flow. *See table(s) above for measurements and observations.     Preliminary    ECHOCARDIOGRAM LIMITED  Result Date: 10/17/2020    ECHOCARDIOGRAM LIMITED REPORT   Patient Name:   Latoya Wright Date of Exam: 10/17/2020 Medical Rec #:  [id number]   Height:       68.0 in Accession #:    [id number]  Weight:       104.4 lb Date of Birth:  03/17/1945   BSA:          1.550 m Patient Age:    66 years    BP:           149/63 mmHg Patient Gender: F           HR:           67 bpm. Exam Location:  Inpatient Procedure: Limited Color Doppler, Limited Echo and Cardiac Doppler Indications:    stroke  History:        Patient has no prior history of Echocardiogram examinations.  Sonographer:    Johny Chess RDCS Referring Phys: [phone] ERIC CHEN  Sonographer Comments: Image acquisition challenging due to respiratory motion. IMPRESSIONS  1. Left ventricular ejection fraction, by estimation, is 60 to 65%. The left ventricle has normal function. The left ventricle has no regional wall motion abnormalities. Left ventricular diastolic parameters are indeterminate.  2. Right ventricular systolic function is normal. The right ventricular size is normal. There is normal pulmonary artery systolic pressure.  3. The mitral valve is  grossly normal. Trivial mitral valve regurgitation. No evidence of mitral stenosis.  4. The aortic valve was not well visualized. Aortic valve regurgitation is not visualized. No aortic stenosis is present. Comparison(s): No prior Echocardiogram.  Conclusion(s)/Recommendation(s): Limited study as patient unable to tolerate procedure. Visualized structures without significant abnormalities. No cardiac source of embolism noted. Unable to assess for shunts on current study. Consider TEE if clinically  indicated. FINDINGS  Left Ventricle: Left ventricular ejection fraction, by estimation, is 60 to 65%. The left ventricle has normal function. The left ventricle has no regional wall motion abnormalities. The left ventricular internal cavity size was normal in size. There is  no left ventricular hypertrophy. Left ventricular diastolic parameters are indeterminate. Right Ventricle: The right ventricular size is normal. Right vetricular wall thickness was not well visualized. Right ventricular systolic function is normal. There is normal pulmonary artery systolic pressure. The tricuspid regurgitant velocity is 2.51 m/s, and with an assumed right atrial pressure of 0 mmHg, the estimated right ventricular systolic pressure is 40.8 mmHg. Left Atrium: Left atrial size was not well visualized. Right Atrium: Right atrial size was not well visualized. Pericardium: The pericardium was not well visualized. Mitral Valve: The mitral valve is grossly normal. Trivial mitral valve regurgitation. No evidence of mitral valve stenosis. Tricuspid Valve: The tricuspid valve is grossly normal. Tricuspid valve regurgitation is trivial. No evidence of tricuspid stenosis. Aortic Valve: The aortic valve was not well visualized. Aortic valve regurgitation is not visualized. No aortic stenosis is present. Pulmonic Valve: The pulmonic valve was not well visualized. Venous: The inferior vena cava was not well visualized. IAS/Shunts: The interatrial septum was not well visualized. LEFT VENTRICLE PLAX 2D LVIDd:         3.10 cm  Diastology LVIDs:         2.00 cm  LV e' medial:    7.18 cm/s LV PW:         0.70 cm  LV E/e' medial:  18.2 LV IVS:        0.70 cm  LV e' lateral:   9.25 cm/s LVOT diam:      1.50 cm  LV E/e' lateral: 14.2 LV SV:         25 LV SV Index:   16 LVOT Area:     1.77 cm  LEFT ATRIUM         Index LA diam:    2.00 cm 1.29 cm/m  AORTIC VALVE LVOT Vmax:   85.20 cm/s LVOT Vmean:  53.400 cm/s LVOT VTI:    0.141 m MITRAL VALVE                TRICUSPID VALVE MV Area (PHT): 3.31 cm     TR Peak grad:   25.2 mmHg MV Decel Time: 229 msec     TR Vmax:        251.00 cm/s MV E velocity: 131.00 cm/s MV A velocity: 137.00 cm/s  SHUNTS MV E/A ratio:  0.96         Systemic VTI:  0.14 m                             Systemic Diam: 1.50 cm Buford Dresser MD Electronically signed by Buford Dresser MD Signature Date/Time: 10/17/2020/3:09:55 PM    Final     Scheduled Meds:  aspirin EC  81 mg Oral Daily   atorvastatin  40 mg Oral QHS   clopidogrel  300  mg Oral Once   Followed by   clopidogrel  75 mg Oral Daily   enoxaparin (LOVENOX) injection  30 mg Subcutaneous Daily   Continuous Infusions:   LOS: 0 days    Time spent: 35 minutes.    Barton Dubois, MD [facility]   To contact the attending provider between 7A-7P or the covering provider during after hours 7P-7A, please log into the web site [email or web address] and access using universal [place] password for that web site. If you do not have the password, please call the hospital operator.  10/17/2020, 3:10 PM

## 2020-10-17 NOTE — Progress Notes (Signed)
  Echocardiogram 2D Echocardiogram has been performed.  Latoya Wright 10/17/2020, 12:51 PM

## 2020-10-17 NOTE — Evaluation (Signed)
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 presenting with sudden onset aphasia and right-sided weakness.  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    Assessment / Plan / Recommendation  Clinical Impression  Pt presents with mild oral dysphagia c/b decreased labial seal.  There was trace anterior loss of regular solid, and incomplete seal when siphoning from straw. Pt initially required 2 swallows per bolus with thin liquid, but this decreased to single swallows over course of trials.  There were no overt s/s of aspiration and pt's symptoms have been improving overall.  There was coughing and choking with intake while pt was in ED.  Discussed increased risk of silent aspiration with subcortical lesions with family and MD.    Recommend initiating regular texture diet with thin liquid.  Pt exhibited dysarthria and perhaps mild aphasia in conversation with MD.  Formal assessment of cognitive-linguistic abilities pending.   SLP Visit Diagnosis: Dysphagia, oropharyngeal phase (R13.12)    Aspiration Risk  Mild aspiration risk    Diet Recommendation Regular;Thin liquid   Liquid Administration via: Cup;Straw Medication Administration:  (No specific precautions) Compensations: Slow rate;Small sips/bites Postural Changes: Seated upright at 90 degrees     Other  Recommendations Oral Care Recommendations: Oral care BID    Recommendations for follow up therapy are one component of a multi-disciplinary discharge planning process, led by the attending physician.  Recommendations may be updated based on patient status, additional functional criteria and insurance authorization.  Follow up Recommendations Inpatient Rehab      Frequency and Duration min 2x/week  2 weeks       Prognosis Prognosis for Safe Diet Advancement: Good      Swallow Study   General Date of Onset: 10/16/20 HPI: Latoya Wright is a 75 y.o. presenting with sudden onset aphasia and right-sided weakness.  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 Type of Study: Bedside Swallow Evaluation Diet Prior to this Study: NPO Temperature Spikes Noted: No History of Recent Intubation: No Behavior/Cognition: Alert;Cooperative;Pleasant mood Oral Cavity Assessment: Within Functional Limits Oral Care Completed by SLP: No Oral Cavity - Dentition: Adequate natural dentition Vision: Functional for self-feeding Self-Feeding Abilities: Able to feed self Patient Positioning: Upright in bed Baseline Vocal Quality: Normal Volitional Cough: Strong Volitional Swallow: Unable to elicit    Oral/Motor/Sensory Function Overall Oral Motor/Sensory Function: Mild impairment Facial ROM: Reduced right Facial Symmetry: Abnormal symmetry right Facial Strength: Reduced right Lingual ROM: Within Functional Limits Lingual Symmetry: Abnormal symmetry right Lingual Strength: Reduced Velum: Within Functional Limits Mandible: Within Functional Limits   Ice Chips Ice chips: Not tested   Thin Liquid Thin Liquid: Impaired Presentation: Cup;Straw Pharyngeal  Phase Impairments: Multiple swallows    Nectar Thick Nectar Thick Liquid: Not tested   Honey Thick Honey Thick Liquid: Not tested  Puree Puree: Within functional limits Presentation: Spoon   Solid      Solid: Impaired Oral Phase Functional Implications: Right anterior spillage      Celedonio Savage, MA, [place] Office: [phone] 10/17/2020,1:58 PM

## 2020-10-17 NOTE — Progress Notes (Signed)
SLP Cancellation Note  Patient Details Name: Latoya Wright MRN: [id number] DOB: October 25, 1945   Cancelled treatment:        Attempted to see pt for swallowing evaluation and speech-language evaluation.  Pt with echo at time of attempt.  Will reattempt as schedule permits.    Celedonio Savage, MA, [place] Office: [phone] 10/17/2020, 11:42 AM

## 2020-10-17 NOTE — Plan of Care (Signed)
Pt is alert x 4. Pt is +1 assist to bathroom. Pt has garbled speech. Weakness to right side, and facial droop to right side.     Problem: Education: Goal: Knowledge of General Education information will improve Description: Including pain rating scale, medication(s)/side effects and non-pharmacologic comfort measures Outcome: Progressing   Problem: Health Behavior/Discharge Planning: Goal: Ability to manage health-related needs will improve Outcome: Progressing   Problem: Clinical Measurements: Goal: Ability to maintain clinical measurements within normal limits will improve Outcome: Progressing Goal: Will remain free from infection Outcome: Progressing Goal: Diagnostic test results will improve Outcome: Progressing Goal: Respiratory complications will improve Outcome: Progressing Goal: Cardiovascular complication will be avoided Outcome: Progressing   Problem: Activity: Goal: Risk for activity intolerance will decrease Outcome: Progressing   Problem: Nutrition: Goal: Adequate nutrition will be maintained Outcome: Progressing   Problem: Coping: Goal: Level of anxiety will decrease Outcome: Progressing   Problem: Elimination: Goal: Will not experience complications related to bowel motility Outcome: Progressing Goal: Will not experience complications related to urinary retention Outcome: Progressing   Problem: Pain Managment: Goal: General experience of comfort will improve Outcome: Progressing   Problem: Safety: Goal: Ability to remain free from injury will improve Outcome: Progressing   Problem: Skin Integrity: Goal: Risk for impaired skin integrity will decrease Outcome: Progressing

## 2020-10-17 NOTE — Evaluation (Signed)
Occupational Therapy Evaluation Patient Details Name: Latoya Wright MRN: [id number] DOB: [birth year] Today's Date: 10/17/2020   History of Present Illness Patient is a 75 y/o female who presents with right sided weakness and speech difficulties 10/16/20. 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 PTA pt reported that she was independent with all ADL's and IADL's including driving and yard work, using no DME. At this time, pt presents with R sided dyspraxia and weakness, as well as expressive language difficulties. She is requiring min-mod A for most ADL's and functional mobility, using a RW. Pt demonstrates difficulty finding/reaching for items on her R side and completing fine motor tasks with her RUE, as well as taking steps with her RLE. Pt will benefit from CIR level therapies to maximize her return to independence and increase her safety with functional mobility and ADL's. OT will follow acutely.      Recommendations for follow up therapy are one component of a multi-disciplinary discharge planning process, led by the attending physician.  Recommendations may be updated based on patient status, additional functional criteria and insurance authorization.   Follow Up Recommendations  CIR    Equipment Recommendations  None recommended by OT    Recommendations for Other Services Rehab consult     Precautions / Restrictions Precautions Precautions: Fall Restrictions Weight Bearing Restrictions: No      Mobility Bed Mobility Overal bed mobility: Needs Assistance Bed Mobility: Supine to Sit     Supine to sit: HOB elevated;Min guard     General bed mobility comments: Cues with increased time/difficulty managing RLE, needs cues to use RUE throughout mobility.    Transfers Overall transfer level: Needs assistance Equipment used: Rolling walker (2 wheeled) Transfers: Sit to/from Stand Sit  to Stand: Min assist         General transfer comment: Min A to steady in standing; reluctant to use RUE during transitions. Stood from [employer]. Unsteady.    Balance Overall balance assessment: Needs assistance Sitting-balance support: Feet supported;No upper extremity supported Sitting balance-Leahy Scale: Good Sitting balance - Comments: Able to sit unsupported and perform MMT without LOB.   Standing balance support: Bilateral upper extremity supported;During functional activity Standing balance-Leahy Scale: Poor Standing balance comment: Min guard-Min A for balance upon standing and with functional tasks.                           ADL either performed or assessed with clinical judgement   ADL Overall ADL's : Needs assistance/impaired Eating/Feeding: Set up;Sitting   Grooming: Set up;Sitting   Upper Body Bathing: Minimal assistance;Sitting   Lower Body Bathing: Moderate assistance;Sitting/lateral leans;Sit to/from stand   Upper Body Dressing : Set up;Min guard;Sitting   Lower Body Dressing: Moderate assistance;Sitting/lateral leans;Sit to/from stand   Toilet Transfer: Moderate assistance;Ambulation   Toileting- Clothing Manipulation and Hygiene: Moderate assistance;Sitting/lateral lean;Sit to/from stand       Functional mobility during ADLs: Moderate assistance;Rolling walker General ADL Comments: Pt presenting with dyspraxia on the R side, requring set up to mod A for all ADLs     Vision Baseline Vision/History: 1 Wears glasses Ability to See in Adequate Light: 0 Adequate Patient Visual Report: Diplopia Vision Assessment?: Yes Eye Alignment: Within Functional Limits Ocular Range of Motion: Within Functional Limits Alignment/Gaze Preference: Within Defined Limits Tracking/Visual Pursuits: Decreased smoothness of horizontal tracking;Decreased smoothness of vertical  tracking Saccades: Decreased speed of saccadic movement Visual Fields: No apparent  deficits Diplopia Assessment: Only with right gaze (Not all the time) Additional Comments: Pt with difficulties grabbing items to the R of her, peripheral fields tested in all quadrants, no apparrent deficits.     Perception Perception Perception Tested?: Yes Perception Deficits: Spatial orientation Spatial deficits: Pt having difficulties reaching to the R, grabing ahold of the RW handle on the R, aware that she isn't able to move her R as quickly or efficitenly as her L.   Praxis Praxis Praxis tested?: Deficits Deficits: Limb apraxia Praxis-Other Comments: On R side only and very minor. Movements slow and pt reports she has to think really hard about each movement on her R    Pertinent Vitals/Pain Pain Assessment: No/denies pain Faces Pain Scale: No hurt     Hand Dominance Right   Extremity/Trunk Assessment Upper Extremity Assessment Upper Extremity Assessment: RUE deficits/detail RUE Deficits / Details: ROM WFL, grip 4/5, elbow flex 5/5, shoulder flexion 4-/5, dyspraxia noted with fine motor skills RUE Coordination: decreased fine motor   Lower Extremity Assessment Lower Extremity Assessment: Defer to PT evaluation   Cervical / Trunk Assessment Cervical / Trunk Assessment: Normal   Communication Communication Communication: Expressive difficulties   Cognition Arousal/Alertness: Awake/alert Behavior During Therapy: WFL for tasks assessed/performed Overall Cognitive Status: Difficult to assess                                 General Comments: A&Ox4, tearful throughout session when asked questions. Some right inattention noted as well as visual deficits.   General Comments  Daughter present and supportive, R side vision vs perceptual deficits, R sided dyspraxia    Exercises     Shoulder Instructions      Home Living Family/patient expects to be discharged to:: Private residence Living Arrangements: Spouse/significant other Available Help at  Discharge: Family;Available PRN/intermittently Type of Home: Other(Comment) (condo) Home Access: Level entry     Home Layout: Two level;Able to live on main level with bedroom/bathroom     Bathroom Shower/Tub: Occupational psychologist: Standard Bathroom Accessibility: Yes How Accessible: Accessible via walker Home Equipment: Shower seat - built in;Cane - single point;Walker - 2 wheels          Prior Functioning/Environment Level of Independence: Independent        Comments: Drives. Works in garden, Dealer Very active.        OT Problem List: Decreased strength;Decreased range of motion;Decreased activity tolerance;Impaired balance (sitting and/or standing);Impaired vision/perception;Decreased coordination;Impaired UE functional use      OT Treatment/Interventions: Self-care/ADL training;Therapeutic exercise;Energy conservation;DME and/or AE instruction;Therapeutic activities;Patient/family education;Balance training    OT Goals(Current goals can be found in the care plan section) Acute Rehab OT Goals Patient Stated Goal: to return to PLOF OT Goal Formulation: With patient Time For Goal Achievement: 10/31/20 Potential to Achieve Goals: Good ADL Goals Pt Will Perform Grooming: with modified independence;standing Pt Will Perform Lower Body Bathing: with supervision;sitting/lateral leans;sit to/from stand Pt Will Perform Lower Body Dressing: with supervision;sitting/lateral leans;sit to/from stand Pt Will Transfer to Toilet: with supervision;ambulating Pt Will Perform Toileting - Clothing Manipulation and hygiene: with supervision;sitting/lateral leans;sit to/from stand  OT Frequency: Min 2X/week   Barriers to D/C:            Co-evaluation              AM-PAC OT "6  Clicks" Daily Activity     Outcome Measure Help from another person eating meals?: A Little Help from another person taking care of personal grooming?: A Little Help from another  person toileting, which includes using toliet, bedpan, or urinal?: A Lot Help from another person bathing (including washing, rinsing, drying)?: A Lot Help from another person to put on and taking off regular upper body clothing?: A Little Help from another person to put on and taking off regular lower body clothing?: A Lot 6 Click Score: 15   End of Session Equipment Utilized During Treatment: Gait belt;Rolling walker Nurse Communication: Mobility status  Activity Tolerance: Patient tolerated treatment well Patient left: in bed;with call bell/phone within reach;with family/visitor present  OT Visit Diagnosis: Unsteadiness on feet (R26.81);Other abnormalities of gait and mobility (R26.89);Muscle weakness (generalized) (M62.81);Apraxia (R48.2)                Time: 4580-9983 OT Time Calculation (min): 25 min Charges:  OT General Charges $OT Visit: 1 Visit OT Evaluation $OT Eval Moderate Complexity: 1 Mod OT Treatments $Therapeutic Activity: 8-22 mins  Lakhia Gengler H., OTR/L Acute Rehabilitation  Chloeann Alfred Elane Yolanda Bonine 10/17/2020, 5:11 PM

## 2020-10-17 NOTE — Progress Notes (Addendum)
STROKE TEAM PROGRESS NOTE   ATTENDING NOTE: I reviewed above note and agree with the assessment and plan. Pt was seen and examined.   75 year old female with no significant past medical history admitted for right-sided weakness and aphasia, symptoms fluctuate.  CT no acute abnormality.  MRI showed left CR infarct.  MRA negative, carotid Doppler negative, EF 60 to 65%.  LDL 121, A1c 5.3, creatinine 0.84.  COVID PCR test positive.  On exam, daughter at bedside, patient lethargic, however awake alert, orientated x3, hypophonia but no aphasia, slight dysarthria, follows some commands.  Able to name and repeat.  Visual fields full, no gaze palsy, mild right facial droop, tongue midline.  Right upper extremity pronator drift 3+/5, right lower extremity proximal 3+/5 proximal and 4/5 distally.  Left upper extremity no drift, at least 4/5, left lower extremity no drift, at least 4/5.  Sensation symmetrical, finger-to-nose bilaterally slow but no ataxia.  Etiology for patient stroke likely due to small vessel disease.  Recommend to continue aspirin 81 and Plavix 75 DAPT for 3 weeks and then aspirin alone.  Continue Lipitor 40.  COVID management per primary team.  PT/OT recommend CIR.  We will follow-up in clinic in 4 weeks.  For detailed assessment and plan, please refer to above as I have made changes wherever appropriate.   Rosalin Hawking, MD PhD Stroke Neurology 10/17/2020 6:13 PM    INTERVAL HISTORY Latoya Wright is a 75 y.o. female with a past medical history significant for good health and good access to healthcare, presenting with sudden onset aphasia and right-sided weakness.  She was out working in the yard when she began to have very significant right-sided weakness, and language difficulty, to the point that she was unable to use her phone.   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    Vitals:   10/17/20 0300 10/17/20 0500 10/17/20 0741 10/17/20 0742  BP:  (!) 127/92 (!) 149/74 (!) 131/97 (!) 149/63  Pulse: 65  76 77  Resp: 14 13 18 17   Temp:      TempSrc:      SpO2: 98%  100% 100%   CBC:  Recent Labs  Lab 10/16/20 1441 10/17/20 0325  WBC 9.3 7.0  NEUTROABS 7.5 5.0  HGB 13.0 12.6  HCT 40.4 38.7  MCV 91.4 89.8  PLT 281 932   Basic Metabolic Panel:  Recent Labs  Lab 10/16/20 1441 10/17/20 0325  NA 140 140  K 3.9 3.8  CL 105 108  CO2 26 25  GLUCOSE 104* 89  BUN 16 14  CREATININE 0.94 0.84  CALCIUM 9.2 8.5*    Lipid Panel:  Recent Labs  Lab 10/17/20 0325  CHOL 212*  TRIG 29  HDL 85  CHOLHDL 2.5  VLDL 6  LDLCALC 121*    HgbA1c:  Recent Labs  Lab 10/17/20 0325  HGBA1C 5.3   Urine Drug Screen: No results for input(s): LABOPIA, COCAINSCRNUR, LABBENZ, AMPHETMU, THCU, LABBARB in the last 168 hours.  Alcohol Level No results for input(s): ETH in the last 168 hours.  IMAGING past 24 hours CT HEAD WO CONTRAST (5MM)  Result Date: 10/16/2020 CLINICAL DATA:  TIA. EXAM: CT HEAD WITHOUT CONTRAST TECHNIQUE: Contiguous axial images were obtained from the base of the skull through the vertex without intravenous contrast. COMPARISON:  October 15, 2014 FINDINGS: Brain: No evidence of acute infarction, hemorrhage, hydrocephalus, extra-axial collection or mass lesion/mass effect. Cavum septum pellucidum et vergae is  again seen. Vascular: No hyperdense vessel or unexpected calcification. Skull: Normal. Negative for fracture or focal lesion. Sinuses/Orbits: No acute finding. Other: None. IMPRESSION: No acute intracranial abnormality. Electronically Signed   By: Virgina Norfolk M.D.   On: 10/16/2020 18:07   MR ANGIO HEAD WO CONTRAST  Result Date: 10/16/2020 CLINICAL DATA:  Neuro deficit, acute, stroke suspected EXAM: MRI HEAD WITHOUT CONTRAST MRA HEAD WITHOUT CONTRAST TECHNIQUE: Multiplanar, multi-echo pulse sequences of the brain and surrounding structures were acquired without intravenous contrast. Angiographic images of the  Circle of Willis were acquired using MRA technique without intravenous contrast. COMPARISON:  No pertinent prior exam. FINDINGS: MRI HEAD Brain: There is reduced diffusion involving the left corona radiata and basal ganglia. No evidence of intracranial hemorrhage. There is no intracranial mass or mass effect. Ventricles and sulci are normal in size and configuration, noting cavum septum pellucidum et vergae. Patchy foci of T2 hyperintensity in the supratentorial white matter are nonspecific but may reflect mild chronic microvascular ischemic changes. There is no hydrocephalus or extra-axial fluid collection. Vascular: Major vessel flow voids at the skull base are preserved. Skull and upper cervical spine: Normal marrow signal is preserved. Sinuses/Orbits: Paranasal sinuses are aerated. Orbits are unremarkable. Other: Sella is unremarkable.  Mastoid air cells are clear. MRA HEAD Intracranial internal carotid arteries are patent. Middle and anterior cerebral arteries are patent. Left A1 ACA is dominant. Intracranial vertebral arteries, basilar artery, posterior cerebral arteries are patent. Left posterior communicating artery is present. There is no significant stenosis or aneurysm. 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  No hemorrhage. Mild chronic microvascular ischemic changes. No proximal intracranial vessel occlusion or significant stenosis. Electronically Signed   By: Macy Mis M.D.   On: 10/16/2020 20:34   MR BRAIN WO CONTRAST  Result Date: 10/16/2020 CLINICAL DATA:  Neuro deficit, acute, stroke suspected EXAM: MRI HEAD WITHOUT CONTRAST MRA HEAD WITHOUT CONTRAST TECHNIQUE: Multiplanar, multi-echo pulse sequences of the brain and surrounding structures were acquired without intravenous contrast. Angiographic images of the Circle of Willis were acquired using MRA technique without intravenous contrast. COMPARISON:  No pertinent prior exam. FINDINGS: MRI HEAD Brain: There is  reduced diffusion involving the left corona radiata and basal ganglia. No evidence of intracranial hemorrhage. There is no intracranial mass or mass effect. Ventricles and sulci are normal in size and configuration, noting cavum septum pellucidum et vergae. Patchy foci of T2 hyperintensity in the supratentorial white matter are nonspecific but may reflect mild chronic microvascular ischemic changes. There is no hydrocephalus or extra-axial fluid collection. Vascular: Major vessel flow voids at the skull base are preserved. Skull and upper cervical spine: Normal marrow signal is preserved. Sinuses/Orbits: Paranasal sinuses are aerated. Orbits are unremarkable. Other: Sella is unremarkable.  Mastoid air cells are clear. MRA HEAD Intracranial internal carotid arteries are patent. Middle and anterior cerebral arteries are patent. Left A1 ACA is dominant. Intracranial vertebral arteries, basilar artery, posterior cerebral arteries are patent. Left posterior communicating artery is present. There is no significant stenosis or aneurysm. 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  No hemorrhage. Mild chronic microvascular ischemic changes. No proximal intracranial vessel occlusion or significant stenosis. Electronically Signed   By: Macy Mis M.D.   On: 10/16/2020 20:34   VAS US CAROTID  Result Date: 10/17/2020 Carotid Arterial Duplex Study Patient Name:  Latoya Wright  Date of Exam:   10/17/2020 Medical Rec #: [id number]    Accession #:    [id number] Date of  Birth: 1945-02-21    Patient Gender: F Patient Age:   72 years Exam Location:  [facility] Procedure:      VAS US CAROTID Referring Phys: ERIC CHEN --------------------------------------------------------------------------------  Indications: CVA. Performing Technologist: Archie Patten RVS  Examination Guidelines: A complete evaluation includes B-mode imaging, spectral Doppler, color Doppler, and power Doppler as needed of all  accessible portions of each vessel. Bilateral testing is considered an integral part of a complete examination. Limited examinations for reoccurring indications may be performed as noted.  Right Carotid Findings: +----------+--------+--------+--------+------------------+--------+           PSV cm/sEDV cm/sStenosisPlaque DescriptionComments +----------+--------+--------+--------+------------------+--------+ CCA Prox  88      16              heterogenous               +----------+--------+--------+--------+------------------+--------+ CCA Distal80      18              heterogenous               +----------+--------+--------+--------+------------------+--------+ ICA Prox  86      22      1-39%   heterogenous               +----------+--------+--------+--------+------------------+--------+ ICA Distal107     30                                         +----------+--------+--------+--------+------------------+--------+ ECA       103     9                                          +----------+--------+--------+--------+------------------+--------+ +----------+--------+-------+--------+-------------------+           PSV cm/sEDV cmsDescribeArm Pressure (mmHG) +----------+--------+-------+--------+-------------------+ [id number]                                         +----------+--------+-------+--------+-------------------+ +---------+--------+--+--------+--+---------+ VertebralPSV cm/s64EDV cm/s14Antegrade +---------+--------+--+--------+--+---------+  Left Carotid Findings: +----------+--------+--------+--------+------------------+--------+           PSV cm/sEDV cm/sStenosisPlaque DescriptionComments +----------+--------+--------+--------+------------------+--------+ CCA Prox  94      21              heterogenous               +----------+--------+--------+--------+------------------+--------+ CCA Distal94      23              heterogenous                +----------+--------+--------+--------+------------------+--------+ ICA Prox  94      28      1-39%   heterogenous               +----------+--------+--------+--------+------------------+--------+ ICA Distal107     38                                         +----------+--------+--------+--------+------------------+--------+ ECA       85                                                 +----------+--------+--------+--------+------------------+--------+ +----------+--------+--------+--------+-------------------+  PSV cm/sEDV cm/sDescribeArm Pressure (mmHG) +----------+--------+--------+--------+-------------------+ Subclavian99                                          +----------+--------+--------+--------+-------------------+ +---------+--------+--+--------+--+---------+ VertebralPSV cm/s76EDV cm/s16Antegrade +---------+--------+--+--------+--+---------+   Summary: Right Carotid: Velocities in the right ICA are consistent with a 1-39% stenosis. Left Carotid: Velocities in the left ICA are consistent with a 1-39% stenosis. Vertebrals: Bilateral vertebral arteries demonstrate antegrade flow. *See table(s) above for measurements and observations.     Preliminary     PHYSICAL EXAM  Mental Status: Patient is awake, alert, oriented to person, place, month, year, and situation. Patient is able to give a clear and coherent history. She makes slight paraphasic/phonemic errors (such as stating touch instead of teach) Cranial Nerves: II: Visual Fields are full. Pupils are equal, round, and reactive to light.   III,IV, VI: EOMI without ptosis or diploplia.  V: Facial sensation is symmetric to temperature VII: Facial movement is notable for a right facial droop.  VIII: hearing is intact to voice X: Uvula elevates symmetrically XI: Shoulder shrug is symmetric. XII: tongue is midline without atrophy or fasciculations.  Motor: Tone is normal. Bulk is  normal. 5/5 strength was present in all four extremities, other than right deltoid 4/5 and left hip 4/5  Sensory: Sensation is symmetric to light touch in the arms and legs. Deep Tendon Reflexes: 3+ and symmetric in the biceps and patellae.  Cerebellar: FNF and HKS are intact bilaterally  ASSESSMENT/PLAN Latoya Wright is a 75 y.o. female with  good health and good access to healthcare, presenting with sudden onset aphasia and right-sided weakness.   She was out working in the yard when she began to have very significant right-sided weakness, and language difficulty, to the point that she was unable to use her phone.  She was eventually able to get to the car and blow the horn repeatedly until someone called EMS.  By the time of EMS arrival her symptoms were rapidly resolving and she was asymptomatic at the time of presentation to the ED.  Unfortunately while awaiting further evaluation, she began to have recurrent symptoms.  On discovery of this, code stroke was reactivated.   Strokel:  left corona radiata and basal ganglia infarct embolic secondary to  small vessel disease source Code Stroke  CT head No acute abnormality.  Small vessel disease. Atrophy.  ASPECTS 10.     MRI  BRAIN Acute infarct left corona radiata and basal ganglia.  No hemorrhage. Mild chronic microvascular ischemic changes. MRA HEAD No proximal intracranial vessel occlusion or significant stenosis. Carotid Doppler    Right Carotid: Velocities in the right ICA are consistent with a 1-39%  stenosis.   Left Carotid: Velocities in the left ICA are consistent with a 1-39%  stenosis.   Vertebrals: Bilateral vertebral arteries demonstrate antegrade flow.  2D Echo done and results are pending    LDL 121 HgbA1c 5.3 VTE prophylaxis - scd     Diet   Diet NPO time specified   No antithrombotic prior to admission, now on aspirin 81 mg daily and clopidogrel 75 mg daily.   Therapy recommendations:  pending Disposition:   pending   Hypertension Home meds:  none Stable Permissive hypertension (OK if < 220/120) but gradually normalize in 5-7 days Long-term BP goal normotensive  Hyperlipidemia Home meds:  none ,   LDL 121, goal <  52 Add lipitor 40mg   High intensity statin   Continue statin at discharge   HgbA1c 5.3, goal < 7.0 CBGs No results for input(s): GLUCAP in the last 72 hours.  SSI  Other Stroke Risk Factors  Advanced Age >/= 15        To contact Stroke Continuity provider, please refer to [email or web address] After hours, contact General Neurology

## 2020-10-17 NOTE — Evaluation (Signed)
Physical Therapy Evaluation Patient Details Name: Latoya Wright MRN: [id number] DOB: 1945/08/11 Today's Date: 10/17/2020  History of Present Illness  Patient is a 75 y/o female who presents with right sided weakness and speech difficulties 10/16/20. 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 Pt lives at home with spouse and reports being independent with ADLs/IADLs and driving PTA living an active lifestyle. Today, pt requires Min A for all mobility and gait training. Noted to have some visual defictis but difficult to fully assess due to language impairments. Visibly emotional during session. Has good supportive spouse. Would benefit from intensive rehab to maximize independence and mobility prior to return home. Will follow acutely to maximize independence and mobility.      Recommendations for follow up therapy are one component of a multi-disciplinary discharge planning process, led by the attending physician.  Recommendations may be updated based on patient status, additional functional criteria and insurance authorization.  Follow Up Recommendations CIR;Supervision for mobility/OOB    Equipment Recommendations  None recommended by PT    Recommendations for Other Services Rehab consult     Precautions / Restrictions Precautions Precautions: Fall Restrictions Weight Bearing Restrictions: No      Mobility  Bed Mobility Overal bed mobility: Needs Assistance Bed Mobility: Supine to Sit     Supine to sit: HOB elevated;Min guard     General bed mobility comments: Cues with increased time/difficulty managing RLE, needs cues to use RUE throughout mobility.    Transfers Overall transfer level: Needs assistance Equipment used: None Transfers: Sit to/from Stand Sit to Stand: Min  assist         General transfer comment: Min A to steady in standing; reluctant to use RUE during transitions. Stood from [employer]. Unsteady.  Ambulation/Gait Ambulation/Gait assistance: Min assist Gait Distance (Feet): 20 Feet (x2 bouts) Assistive device: 1 person hand held assist;None Gait Pattern/deviations: Step-through pattern;Narrow base of support;Decreased stride length Gait velocity: decreased Gait velocity interpretation: <1.31 ft/sec, indicative of household ambulator General Gait Details: SLow, guarded gait with HHA initially due to unsteadiness, Min A for balance esp with turns. Rigid trunk and decreased arm swing bilaterally.  Stairs            Wheelchair Mobility    Modified Rankin (Stroke Patients Only) Modified Rankin (Stroke Patients Only) Pre-Morbid Rankin Score: No symptoms Modified Rankin: Moderately severe disability     Balance Overall balance assessment: Needs assistance Sitting-balance support: Feet supported;No upper extremity supported Sitting balance-Leahy Scale: Fair Sitting balance - Comments: Able to sit unsupported and perform MMT without LOB.   Standing balance support: During functional activity Standing balance-Leahy Scale: Poor Standing balance comment: Min guard-Min A for balance upon standing and with functional tasks.                             Pertinent Vitals/Pain Pain Assessment: No/denies pain    Home Living Family/patient expects to be discharged to:: Private residence Living Arrangements: Spouse/significant other Available Help at Discharge: Family;Available PRN/intermittently Type of Home: Other(Comment) Home Access: Level entry     Home Layout: Two level;Able to live on main level with bedroom/bathroom Home Equipment: Shower seat - built in;Cane - single point      Prior Function Level of Independence: Independent  Comments: Drives. Works in garden, Dealer Very active.     Hand  Dominance   Dominant Hand: Right    Extremity/Trunk Assessment   Upper Extremity Assessment Upper Extremity Assessment: Defer to OT evaluation    Lower Extremity Assessment Lower Extremity Assessment: RLE deficits/detail;LLE deficits/detail RLE Deficits / Details: Grossly ~4-5/5 throughout extremies RLE Sensation: WNL RLE Coordination: decreased fine motor;decreased gross motor LLE Deficits / Details: Grossly ~4/5 hip flexion, 5/5 throughout    Cervical / Trunk Assessment Cervical / Trunk Assessment: Normal  Communication   Communication: Expressive difficulties  Cognition Arousal/Alertness: Awake/alert Behavior During Therapy: WFL for tasks assessed/performed Overall Cognitive Status: Difficult to assess                                 General Comments: A&Ox4, tearful throughout session when asked questions. Some right inattention noted as well as visual deficits.      General Comments General comments (skin integrity, edema, etc.): Spouse present during session. Noted to have some visual deficits possibly in right lower quadrant but needs more assessment, difficult to fully assess due to language deficits.    Exercises     Assessment/Plan    PT Assessment Patient needs continued PT services  PT Problem List Decreased strength;Decreased range of motion;Decreased mobility;Decreased cognition;Decreased balance;Decreased coordination       PT Treatment Interventions Therapeutic exercise;Gait training;Stair training;Functional mobility training;Therapeutic activities;Patient/family education;Cognitive remediation;Neuromuscular re-education;Balance training    PT Goals (Current goals can be found in the Care Plan section)  Acute Rehab PT Goals Patient Stated Goal: to return to PLOF PT Goal Formulation: With patient/family Time For Goal Achievement: 10/31/20 Potential to Achieve Goals: Good    Frequency Min 4X/week   Barriers to discharge         Co-evaluation               AM-PAC PT "6 Clicks" Mobility  Outcome Measure Help needed turning from your back to your side while in a flat bed without using bedrails?: A Little Help needed moving from lying on your back to sitting on the side of a flat bed without using bedrails?: A Little Help needed moving to and from a bed to a chair (including a wheelchair)?: A Little Help needed standing up from a chair using your arms (e.g., wheelchair or bedside chair)?: A Little Help needed to walk in hospital room?: A Little Help needed climbing 3-5 steps with a railing? : [employer] 6 Click Score: 17    End of Session Equipment Utilized During Treatment: Gait belt Activity Tolerance: Patient tolerated treatment well Patient left: in bed;with call bell/phone within reach;with bed alarm set;with family/visitor present (sitting EOB) Nurse Communication: Mobility status PT Visit Diagnosis: Unsteadiness on feet (R26.81);Hemiplegia and hemiparesis Hemiplegia - Right/Left: Right Hemiplegia - dominant/non-dominant: Dominant Hemiplegia - caused by: Cerebral infarction    Time: 1202-1228 PT Time Calculation (min) (ACUTE ONLY): 26 min   Charges:   PT Evaluation $PT Eval Moderate Complexity: 1 Mod PT Treatments $Therapeutic Activity: 8-22 mins        Marisa Severin, PT, DPT Acute Rehabilitation Services Pager [phone] Office [place] 10/17/2020, 1:01 PM

## 2020-10-17 NOTE — ED Notes (Signed)
Pt care taken, no complaints at this time. 

## 2020-10-17 NOTE — Progress Notes (Signed)
Please inform patient of the following:  Labs are all stable.  Algis Greenhouse. Jerline Pain, MD 10/17/2020 3:18 PM

## 2020-10-17 NOTE — Progress Notes (Signed)
Carotid duplex has been completed.   Preliminary results in CV Proc.   Latoya Wright 10/17/2020 10:25 AM

## 2020-10-18 DIAGNOSIS — R636 Underweight: Secondary | ICD-10-CM

## 2020-10-18 DIAGNOSIS — E785 Hyperlipidemia, unspecified: Secondary | ICD-10-CM | POA: Diagnosis not present

## 2020-10-18 DIAGNOSIS — U071 COVID-19: Secondary | ICD-10-CM | POA: Diagnosis not present

## 2020-10-18 DIAGNOSIS — I639 Cerebral infarction, unspecified: Secondary | ICD-10-CM | POA: Diagnosis not present

## 2020-10-18 DIAGNOSIS — I10 Essential (primary) hypertension: Secondary | ICD-10-CM | POA: Diagnosis not present

## 2020-10-18 LAB — HEPATIC FUNCTION PANEL
ALT: 14 U/L (ref 0–44)
AST: 18 U/L (ref 15–41)
Albumin: 3.6 g/dL (ref 3.5–5.0)
Alkaline Phosphatase: 46 U/L (ref 38–126)
Bilirubin, Direct: <0.1 mg/dL (ref 0.0–0.2)
Total Bilirubin: 1.1 mg/dL (ref 0.3–1.2)
Total Protein: 6 g/dL — ABNORMAL LOW (ref 6.5–8.1)

## 2020-10-18 LAB — BASIC METABOLIC PANEL
Anion gap: 6 (ref 5–15)
BUN: 16 mg/dL (ref 8–23)
CO2: 24 mmol/L (ref 22–32)
Calcium: 8.4 mg/dL — ABNORMAL LOW (ref 8.9–10.3)
Chloride: 112 mmol/L — ABNORMAL HIGH (ref 98–111)
Creatinine, Ser: 0.77 mg/dL (ref 0.44–1.00)
GFR, Estimated: >60 mL/min (ref >60)
Glucose, Bld: 83 mg/dL (ref 70–99)
Potassium: 3.7 mmol/L (ref 3.5–5.1)
Sodium: 142 mmol/L (ref 135–145)

## 2020-10-18 LAB — CBC
HCT: 41 % (ref 36.0–46.0)
Hemoglobin: 13.2 g/dL (ref 12.0–15.0)
MCH: 29.5 pg (ref 26.0–34.0)
MCHC: 32.2 g/dL (ref 30.0–36.0)
MCV: 91.5 fL (ref 80.0–100.0)
Platelets: 243 10*3/uL (ref 150–400)
RBC: 4.48 MIL/uL (ref 3.87–5.11)
RDW: 14.1 % (ref 11.5–15.5)
WBC: 4.8 10*3/uL (ref 4.0–10.5)
nRBC: 0 % (ref 0.0–0.2)

## 2020-10-18 NOTE — Progress Notes (Signed)
Physical Therapy Treatment Patient Details Name: Latoya Wright MRN: [id number] DOB: 1945-10-16 Today's Date: 10/18/2020   History of Present Illness Patient is a 75 y/o female who presents with right sided weakness and speech difficulties 10/16/20. 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 She required min assist transfers, and min/HHA ambulation 20'. Pt ambulated to sink. Stood at sink x 10 minutes with BUE support and min/min guard assist from therapist. Pt tearful upon first seeing her reflection in mirror due to R facial drop. Pt educated in facial muscle exercises (exaggerated facial expressions). She then performed facial exercises as well as standing LE exercises before ambulating back to recliner. Pt remained in recliner at end of session. Pt's daughters present in room.    Recommendations for follow up therapy are one component of a multi-disciplinary discharge planning process, led by the attending physician.  Recommendations may be updated based on patient status, additional functional criteria and insurance authorization.  Follow Up Recommendations  CIR;Supervision for mobility/OOB     Equipment Recommendations  None recommended by PT    Recommendations for Other Services Rehab consult     Precautions / Restrictions Precautions Precautions: Fall     Mobility  Bed Mobility               General bed mobility comments: Pt in recliner.    Transfers Overall transfer level: Needs assistance Equipment used: 1 person hand held assist Transfers: Sit to/from Stand Sit to Stand: Min assist         General transfer comment: assist to power up and stabilize balance  Ambulation/Gait Ambulation/Gait assistance: Min assist Gait Distance (Feet): 20 Feet Assistive device: 1 person hand held assist Gait Pattern/deviations: Step-through pattern;Narrow base of support;Decreased stride  length;Decreased step length - right Gait velocity: decreased Gait velocity interpretation: <1.31 ft/sec, indicative of household ambulator General Gait Details: HHA on L with therapist's R hand providing trunk support on pt's R due to R lean. Decreased foot clearance R. Pt ambulated 10', stood at sink with min assist and BUE support x 10 minutes, then ambulated back to recliner.   Stairs             Wheelchair Mobility    Modified Rankin (Stroke Patients Only) Modified Rankin (Stroke Patients Only) Pre-Morbid Rankin Score: No symptoms Modified Rankin: Moderately severe disability     Balance Overall balance assessment: Needs assistance Sitting-balance support: Feet supported;No upper extremity supported Sitting balance-Leahy Scale: Good     Standing balance support: Single extremity supported;Bilateral upper extremity supported;During functional activity Standing balance-Leahy Scale: Poor Standing balance comment: reliant on externa support                            Cognition Arousal/Alertness: Awake/alert Behavior During Therapy: WFL for tasks assessed/performed Overall Cognitive Status: Difficult to assess                                 General Comments: Appropriate. Following commands. Decreased safety awareness. Tearful when she saw herself in the mirror.      Exercises General Exercises - Lower Extremity Ankle Circles/Pumps: AROM;Both;10 reps;Seated Long Arc Quad: AROM;Right;Left;5 reps;Seated Hip Flexion/Marching: AROM;Right;Left;5 reps;Standing Heel Raises: AROM;Both;10 reps;Standing Other Exercises Other Exercises: Pt instructed in facial exercises (exaggerated facial expressions) with use of  a Geologist, engineering.    General Comments General comments (skin integrity, edema, etc.): VSS on RA. Daughters present in room and supportive.      Pertinent Vitals/Pain Pain Assessment: No/denies pain    Home Living                       Prior Function            PT Goals (current goals can now be found in the care plan section) Acute Rehab PT Goals Patient Stated Goal: to return to PLOF Progress towards PT goals: Progressing toward goals    Frequency    Min 4X/week      PT Plan Current plan remains appropriate    Co-evaluation              AM-PAC PT "6 Clicks" Mobility   Outcome Measure  Help needed turning from your back to your side while in a flat bed without using bedrails?: A Little Help needed moving from lying on your back to sitting on the side of a flat bed without using bedrails?: A Little Help needed moving to and from a bed to a chair (including a wheelchair)?: A Little Help needed standing up from a chair using your arms (e.g., wheelchair or bedside chair)?: A Little Help needed to walk in hospital room?: A Little Help needed climbing 3-5 steps with a railing? : A Lot 6 Click Score: 17    End of Session   Activity Tolerance: Patient tolerated treatment well Patient left: in chair;with call bell/phone within reach;with family/visitor present Nurse Communication: Mobility status PT Visit Diagnosis: Unsteadiness on feet (R26.81);Hemiplegia and hemiparesis Hemiplegia - Right/Left: Right Hemiplegia - dominant/non-dominant: Dominant Hemiplegia - caused by: Cerebral infarction     Time: 4585-9292 PT Time Calculation (min) (ACUTE ONLY): 29 min  Charges:  $Gait Training: 8-22 mins $Therapeutic Activity: 8-22 mins                     Lorrin Goodell, PT  Office # [phone] Pager [phone]    Lorriane Shire 10/18/2020, 3:48 PM

## 2020-10-18 NOTE — Plan of Care (Signed)
Patient is alert oriented x 4. Pt has slurred speech with some some expressive aphasia.Right sided weakness. Denies pain. Pt noted crying. Pts husband stated she has been upset regarding her condition. Support provided. Pt requested PRN medication for anxiety, given. Encouraged to call out if assistance is needed.  Problem: Education: Goal: Knowledge of General Education information will improve Description: Including pain rating scale, medication(s)/side effects and non-pharmacologic comfort measures Outcome: Progressing   Problem: Health Behavior/Discharge Planning: Goal: Ability to manage health-related needs will improve Outcome: Progressing   Problem: Clinical Measurements: Goal: Ability to maintain clinical measurements within normal limits will improve Outcome: Progressing   Problem: Clinical Measurements: Goal: Ability to maintain clinical measurements within normal limits will improve Outcome: Progressing Goal: Will remain free from infection Outcome: Progressing Goal: Diagnostic test results will improve Outcome: Progressing Goal: Respiratory complications will improve Outcome: Progressing Goal: Cardiovascular complication will be avoided Outcome: Progressing   Problem: Activity: Goal: Risk for activity intolerance will decrease Outcome: Progressing   Problem: Nutrition: Goal: Adequate nutrition will be maintained Outcome: Progressing   Problem: Coping: Goal: Level of anxiety will decrease Outcome: Progressing   Problem: Elimination: Goal: Will not experience complications related to bowel motility Outcome: Progressing Goal: Will not experience complications related to urinary retention Outcome: Progressing   Problem: Pain Managment: Goal: General experience of comfort will improve Outcome: Progressing   Problem: Safety: Goal: Ability to remain free from injury will improve Outcome: Progressing   Problem: Skin Integrity: Goal: Risk for impaired skin  integrity will decrease Outcome: Progressing   Problem: Education: Goal: Knowledge of disease or condition will improve Outcome: Progressing Goal: Knowledge of secondary prevention will improve Outcome: Progressing Goal: Knowledge of patient specific risk factors addressed and post discharge goals established will improve Outcome: Progressing Goal: Individualized Educational Video(s) Outcome: Progressing   Problem: Coping: Goal: Will verbalize positive feelings about self Outcome: Progressing Goal: Will identify appropriate support needs Outcome: Progressing   Problem: Health Behavior/Discharge Planning: Goal: Ability to manage health-related needs will improve Outcome: Progressing   Problem: Self-Care: Goal: Ability to participate in self-care as condition permits will improve Outcome: Progressing Goal: Verbalization of feelings and concerns over difficulty with self-care will improve Outcome: Progressing Goal: Ability to communicate needs accurately will improve Outcome: Progressing   Problem: Nutrition: Goal: Risk of aspiration will decrease Outcome: Progressing Goal: Dietary intake will improve Outcome: Progressing   Problem: Ischemic Stroke/TIA Tissue Perfusion: Goal: Complications of ischemic stroke/TIA will be minimized Outcome: Progressing

## 2020-10-18 NOTE — Plan of Care (Signed)

## 2020-10-18 NOTE — Progress Notes (Signed)
Oral care completed, suctioned. Denies pain. Husband at bedside.

## 2020-10-18 NOTE — Progress Notes (Signed)
PROGRESS NOTE  Latoya Wright LYY:[id number] DOB: 15-Nov-1945   PCP: Vivi Barrack, MD  Patient is from: Home.  Independently ambulates at baseline.  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  MRA head and neck, CUS and TTE without significant finding.  LDL 121.  HDL 85.  A1c 5.3%.  Neurology recommended permissive hypertension and Plavix and aspirin for 3 weeks followed by aspirin alone.  Therapy recommended CIR.  Patient also tested positive for COVID 19.  Not symptomatic.  She is vaccinated including booster.  Started on remdesivir.  Subjective: Seen and examined earlier this morning.  No major events overnight of this morning.  Still with aphasia and right-sided weakness.  No new focal neuro symptoms.  She denies chest pain, dyspnea, GI or UTI symptoms.  Patient's husband at bedside.  Objective: Vitals:   10/18/20 0100 10/18/20 0425 10/18/20 0500 10/18/20 0741  BP: (!) 148/72 (!) 142/75  135/63  Pulse: 71 73  66  Resp: 15 16  13   Temp: 98.5 F (36.9 C) 98.7 F (37.1 C)  98 F (36.7 C)  TempSrc: Axillary Axillary  Oral  SpO2: 99% 99%  99%  Weight:   47.2 kg     Intake/Output Summary (Last 24 hours) at 10/18/2020 1756 Last data filed at 10/18/2020 0400 Gross per 24 hour  Intake 90 ml  Output --  Net 90 ml   Filed Weights   10/18/20 0500  Weight: 47.2 kg    Examination:  GENERAL: No apparent distress.  Nontoxic. HEENT: MMM.  Vision and hearing grossly intact.  NECK: Supple.  No apparent JVD.  RESP:  No IWOB.  Fair aeration bilaterally. CVS:  RRR. Heart sounds normal.  ABD/GI/GU: BS+. Abd soft, NTND.  MSK/EXT:  Moves  extremities.  Significant muscle mass and subcu fat loss. SKIN: no apparent skin lesion or wound NEURO: Awake, alert and oriented appropriately.  Expressive aphasia.  Right facial droop.  Motor 4/5 in right, 5/5 in the left.  Right pronator drift.  Patellar reflex symmetric.  No apparent focal neuro deficit. PSYCH: Calm. Normal affect.   Procedures:  None  Microbiology summarized: COVID-19 PCR positive.  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  Risk factors include HTN, HLD, age, sex and recent COVID-37 infection.  MRA head/neck, CUS and TTE without significant finding.  LDL 121.  HDL 85.  A1c 5.3%. -Appreciate neurology recommendations   -Permissive HTN for now and normalize BP over 5 to 7 days  -DAPT with Plavix and aspirin for 3 weeks followed by aspirin alone  -Continue Lipitor 40 mg daily -PT/OT recommended CIR.  COVID-19 infection-incidental finding.  Vaccinated including booster.  Not symptomatic from this -Complete 3 days of remdesivir  Essential hypertension -As above.  Hyperlipidemia -As above.  Underweight Body mass index is 15.82 kg/m.         DVT prophylaxis:  enoxaparin (LOVENOX) injection 30 mg Start: 10/17/20 1000 SCDs Start: 10/16/20 2244  Code Status: Full code Family Communication: Updated patient's husband at bedside. Level of care: Telemetry Medical Status is: Inpatient  Remains inpatient appropriate  because:Unsafe d/c plan  Dispo: The patient is from: Home              Anticipated d/c is to: CIR              Patient currently is medically stable to d/c.   Difficult to place patient No       Consultants:  Neurology   Sch Meds:  Scheduled Meds:  aspirin EC  81 mg Oral Daily   atorvastatin  40 mg Oral QHS   clopidogrel  75 mg Oral Daily   enoxaparin (LOVENOX) injection  30 mg Subcutaneous Daily   Continuous Infusions:  remdesivir 100 mg  in NS 100 mL 100 mg (10/18/20 0947)   PRN Meds:.acetaminophen **OR** acetaminophen, LORazepam, melatonin, ondansetron **OR** ondansetron (ZOFRAN) IV  Antimicrobials: Anti-infectives (From admission, onward)    Start     Dose/Rate Route Frequency Ordered Stop   10/18/20 1000  remdesivir 100 mg in sodium chloride 0.9 % 100 mL IVPB       See Hyperspace for full Linked Orders Report.   100 mg 200 mL/hr over 30 Minutes Intravenous Daily 10/17/20 1516 10/20/20 0959   10/17/20 1615  remdesivir 200 mg in sodium chloride 0.9% 250 mL IVPB       See Hyperspace for full Linked Orders Report.   200 mg 580 mL/hr over 30 Minutes Intravenous Once 10/17/20 1516 10/17/20 1815        I have personally reviewed the following labs and images: CBC: Recent Labs  Lab 10/14/20 1507 10/16/20 1441 10/17/20 0325 10/18/20 0544  WBC 7.4 9.3 7.0 4.8  NEUTROABS  --  7.5 5.0  --   HGB 12.6 13.0 12.6 13.2  HCT 38.6 40.4 38.7 41.0  MCV 88.9 91.4 89.8 91.5  PLT 261.0 281 250 243   BMP &GFR Recent Labs  Lab 10/14/20 1507 10/16/20 1441 10/17/20 0325 10/18/20 0544  NA 140 140 140 142  K 4.6 3.9 3.8 3.7  CL 106 105 108 112*  CO2 28 26 25 24   GLUCOSE 79 104* 89 83  BUN 20 16 14 16   CREATININE 1.06 0.94 0.84 0.77  CALCIUM 9.3 9.2 8.5* 8.4*   Estimated Creatinine Clearance: 45.3 mL/min (by C-G formula based on SCr of 0.77 mg/dL). Liver & Pancreas: Recent Labs  Lab 10/14/20 1507 10/16/20 1441 10/17/20 0325 10/18/20 0544  AST 19 19 18 18   ALT 13 14 13 14   ALKPHOS 51 55 45 46  BILITOT 0.5 0.8 1.2 1.1  PROT 6.9 6.9 6.1* 6.0*  ALBUMIN 4.2 4.1 3.5 3.6   No results for input(s): LIPASE, AMYLASE in the last 168 hours. No results for input(s): AMMONIA in the last 168 hours. Diabetic: Recent Labs    10/17/20 0325  HGBA1C 5.3   No results for input(s): GLUCAP in the last 168 hours. Cardiac Enzymes: No results for input(s): CKTOTAL, CKMB, CKMBINDEX, TROPONINI in the last 168 hours. No results  for input(s): PROBNP in the last 8760 hours. Coagulation Profile: No results for input(s): INR, PROTIME in the last 168 hours. Thyroid Function Tests: No results for input(s): TSH, T4TOTAL, FREET4, T3FREE, THYROIDAB in the last 72 hours. Lipid Profile: Recent Labs    10/17/20 0325  CHOL 212*  HDL 85  LDLCALC 121*  TRIG 29  CHOLHDL 2.5   Anemia Panel: No results for input(s): VITAMINB12, FOLATE, FERRITIN, TIBC, IRON, RETICCTPCT in the last 72 hours. Urine analysis:    Component Value Date/Time   COLORURINE YELLOW 10/16/2020 1458  APPEARANCEUR CLEAR 10/16/2020 1458   LABSPEC 1.015 10/16/2020 1458   PHURINE 5.0 10/16/2020 1458   GLUCOSEU NEGATIVE 10/16/2020 1458   HGBUR NEGATIVE 10/16/2020 1458   BILIRUBINUR NEGATIVE 10/16/2020 1458   BILIRUBINUR Small 05/14/2014 1641   KETONESUR 5 (A) 10/16/2020 1458   PROTEINUR NEGATIVE 10/16/2020 1458   UROBILINOGEN 0.2 05/14/2014 1641   NITRITE NEGATIVE 10/16/2020 1458   LEUKOCYTESUR NEGATIVE 10/16/2020 1458   Sepsis Labs: Invalid input(s): PROCALCITONIN, [place]  Microbiology: Recent Results (from the past 240 hour(s))  Resp Panel by RT-PCR (Flu A&B, Covid) Nasopharyngeal Swab     Status: Abnormal   Collection Time: 10/16/20  6:43 PM   Specimen: Nasopharyngeal Swab; Nasopharyngeal(NP) swabs in vial transport medium  Result Value Ref Range Status   SARS Coronavirus 2 by RT PCR POSITIVE (A) NEGATIVE Final    Comment: RESULT CALLED TO, READ BACK BY AND VERIFIED WITH: RN COURTNEY L. 10/16/20@21 :17 BY TW (NOTE) SARS-CoV-2 target nucleic acids are DETECTED.  The SARS-CoV-2 RNA is generally detectable in upper respiratory specimens during the acute phase of infection. Positive results are indicative of the presence of the identified virus, but do not rule out bacterial infection or co-infection with other pathogens not detected by the test. Clinical correlation with patient history and other diagnostic information is necessary to  determine patient infection status. The expected result is Negative.  Fact Sheet for Patients: EntrepreneurPulse.com.au  Fact Sheet for Healthcare Providers: IncredibleEmployment.be  This test is not yet approved or cleared by the [place] FDA and  has been authorized for detection and/or diagnosis of SARS-CoV-2 by FDA under an Emergency Use Authorization (EUA).  This EUA will remain in effect (meaning this test can b e used) for the duration of  the COVID-19 declaration under Section 564(b)(1) of the Act, 21 U.S.C. section 360bbb-3(b)(1), unless the authorization is terminated or revoked sooner.     Influenza A by PCR NEGATIVE NEGATIVE Final   Influenza B by PCR NEGATIVE NEGATIVE Final    Comment: (NOTE) The Xpert Xpress SARS-CoV-2/FLU/RSV plus assay is intended as an aid in the diagnosis of influenza from Nasopharyngeal swab specimens and should not be used as a sole basis for treatment. Nasal washings and aspirates are unacceptable for Xpert Xpress SARS-CoV-2/FLU/RSV testing.  Fact Sheet for Patients: EntrepreneurPulse.com.au  Fact Sheet for Healthcare Providers: IncredibleEmployment.be  This test is not yet approved or cleared by the [place] FDA and has been authorized for detection and/or diagnosis of SARS-CoV-2 by FDA under an Emergency Use Authorization (EUA). This EUA will remain in effect (meaning this test can be used) for the duration of the COVID-19 declaration under Section 564(b)(1) of the Act, 21 U.S.C. section 360bbb-3(b)(1), unless the authorization is terminated or revoked.  Performed at [facility], [place] [address], [place], [place] [address]     Radiology Studies: No results found.   Brookelyn Gaynor T. Grafton  If 7PM-7AM, please contact night-coverage [email or web address] 10/18/2020, 5:56 PM

## 2020-10-18 NOTE — Plan of Care (Signed)
Pt is alert oriented x 4. Pt has dysarthria. Pt Has right sided weakness with drift to right arm and leg.  Pt has been ambulating to bathroom. Pt brushed her own teeth.  Husband is present at the bedside and insist on helping her encouraged to call for help when needed. Pt denies pain.   Problem: Education: Goal: Knowledge of General Education information will improve Description: Including pain rating scale, medication(s)/side effects and non-pharmacologic comfort measures Outcome: Progressing   Problem: Health Behavior/Discharge Planning: Goal: Ability to manage health-related needs will improve Outcome: Progressing   Problem: Clinical Measurements: Goal: Ability to maintain clinical measurements within normal limits will improve Outcome: Progressing Goal: Will remain free from infection Outcome: Progressing Goal: Diagnostic test results will improve Outcome: Progressing Goal: Respiratory complications will improve Outcome: Progressing Goal: Cardiovascular complication will be avoided Outcome: Progressing   Problem: Activity: Goal: Risk for activity intolerance will decrease Outcome: Progressing   Problem: Nutrition: Goal: Adequate nutrition will be maintained Outcome: Progressing   Problem: Coping: Goal: Level of anxiety will decrease Outcome: Progressing   Problem: Elimination: Goal: Will not experience complications related to bowel motility Outcome: Progressing Goal: Will not experience complications related to urinary retention Outcome: Progressing   Problem: Pain Managment: Goal: General experience of comfort will improve Outcome: Progressing   Problem: Safety: Goal: Ability to remain free from injury will improve Outcome: Progressing   Problem: Skin Integrity: Goal: Risk for impaired skin integrity will decrease Outcome: Progressing   Problem: Education: Goal: Knowledge of disease or condition will improve Outcome: Progressing Goal: Knowledge of  secondary prevention will improve Outcome: Progressing Goal: Knowledge of patient specific risk factors addressed and post discharge goals established will improve Outcome: Progressing Goal: Individualized Educational Video(s) Outcome: Progressing   Problem: Coping: Goal: Will verbalize positive feelings about self Outcome: Progressing Goal: Will identify appropriate support needs Outcome: Progressing   Problem: Health Behavior/Discharge Planning: Goal: Ability to manage health-related needs will improve Outcome: Progressing   Problem: Self-Care: Goal: Ability to participate in self-care as condition permits will improve Outcome: Progressing Goal: Verbalization of feelings and concerns over difficulty with self-care will improve Outcome: Progressing Goal: Ability to communicate needs accurately will improve Outcome: Progressing   Problem: Nutrition: Goal: Risk of aspiration will decrease Outcome: Progressing Goal: Dietary intake will improve Outcome: Progressing   Problem: Ischemic Stroke/TIA Tissue Perfusion: Goal: Complications of ischemic stroke/TIA will be minimized Outcome: Progressing

## 2020-10-19 DIAGNOSIS — I639 Cerebral infarction, unspecified: Secondary | ICD-10-CM | POA: Diagnosis not present

## 2020-10-19 DIAGNOSIS — U071 COVID-19: Secondary | ICD-10-CM | POA: Diagnosis not present

## 2020-10-19 DIAGNOSIS — I10 Essential (primary) hypertension: Secondary | ICD-10-CM | POA: Diagnosis not present

## 2020-10-19 DIAGNOSIS — E785 Hyperlipidemia, unspecified: Secondary | ICD-10-CM | POA: Diagnosis not present

## 2020-10-19 LAB — HEPATIC FUNCTION PANEL
ALT: 15 U/L (ref 0–44)
AST: 18 U/L (ref 15–41)
Albumin: 3.4 g/dL — ABNORMAL LOW (ref 3.5–5.0)
Alkaline Phosphatase: 42 U/L (ref 38–126)
Bilirubin, Direct: 0.1 mg/dL (ref 0.0–0.2)
Indirect Bilirubin: 1.2 mg/dL — ABNORMAL HIGH (ref 0.3–0.9)
Total Bilirubin: 1.3 mg/dL — ABNORMAL HIGH (ref 0.3–1.2)
Total Protein: 5.7 g/dL — ABNORMAL LOW (ref 6.5–8.1)

## 2020-10-19 NOTE — Plan of Care (Signed)

## 2020-10-19 NOTE — Progress Notes (Signed)
PROGRESS NOTE  Latoya Wright BDZ:[id number] DOB: 05/12/1945   PCP: Vivi Barrack, MD  Patient is from: Home.  Independently ambulates at baseline.  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  MRA head and neck, CUS and TTE without significant finding.  LDL 121.  HDL 85.  A1c 5.3%.  Neurology recommended permissive hypertension and Plavix and aspirin for 3 weeks followed by aspirin alone.  Therapy recommended CIR.  Patient incidentally tested positive for COVID 19.  Not symptomatic.  She is vaccinated including booster.  Started on remdesivir for 3 days.  Not able to go to CIR until she is off precaution.  Subjective: Seen and examined earlier this morning.  No major events overnight of this morning.  No complaints.  Patient's sister at bedside.  Still with right-sided weakness, dysarthria, expressive aphasia.  Objective: Vitals:   10/19/20 0049 10/19/20 0303 10/19/20 0500 10/19/20 0838  BP: (!) 140/55 (!) 150/78  (!) 147/83  Pulse: 65 70  82  Resp: 16 15  18   Temp: 97.9 F (36.6 C) 98.7 F (37.1 C)    TempSrc: Axillary Axillary    SpO2: 100% 98%  100%  Weight:   47.8 kg     Intake/Output Summary (Last 24 hours) at 10/19/2020 1446 Last data filed at 10/19/2020 0300 Gross per 24 hour  Intake 190 ml  Output --  Net 190 ml   Filed Weights   10/18/20 0500 10/19/20 0500  Weight: 47.2 kg 47.8 kg    Examination:  GENERAL: No apparent distress.  Nontoxic. HEENT: MMM.  Vision and hearing grossly intact.  NECK: Supple.  No apparent JVD.  RESP:  No IWOB.  Fair aeration bilaterally. CVS:  RRR. Heart sounds normal.  ABD/GI/GU: BS+.  Abd soft, NTND.  MSK/EXT:  Moves extremities. No apparent deformity. No edema.  SKIN: no apparent skin lesion or wound NEURO: Awake and alert. Oriented appropriately.  Dysarthria, expressive aphasia, right facial droop, right-sided weakness and right pronator drift PSYCH: Calm. Normal affect.   Procedures:  None  Microbiology summarized: COVID-19 PCR positive.  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  Risk factors include HTN, HLD, age, sex and recent COVID-31 infection.  MRA head/neck, CUS and TTE without significant finding.  LDL 121.  HDL 85.  A1c 5.3%.  Still with dysarthria, expressive aphasia, right facial droop, right-sided weakness and right pronator drift -Appreciate neurology recommendations   -Permissive HTN for now and normalize BP over 5 to 7 days  -DAPT with Plavix and aspirin for 3 weeks followed by aspirin alone  -Continue Lipitor 40 mg daily -PT/OT recommended CIR.  COVID-19 infection-incidental finding.  Vaccinated including booster.  Not symptomatic from this -Complete 3 days of remdesivir -Isolation precaution for 10 days, through 10/2  Essential hypertension -As above.  Hyperlipidemia -As above.  Underweight Body mass index is 16.02 kg/m.         DVT prophylaxis:  enoxaparin (LOVENOX) injection 30 mg Start: 10/17/20 1000 SCDs Start: 10/16/20 2244  Code Status: Full code Family Communication: Updated patient's sister at bedside. Level of care: Telemetry  Medical Status is: Inpatient  Remains inpatient appropriate because:Unsafe d/c plan  Dispo: The patient is from: Home              Anticipated d/c is to: CIR              Patient currently is medically stable to d/c.   Difficult to place patient No       Consultants:  Neurology   Sch Meds:  Scheduled Meds:  aspirin EC  81 mg Oral Daily   atorvastatin  40 mg Oral QHS   clopidogrel  75 mg  Oral Daily   enoxaparin (LOVENOX) injection  30 mg Subcutaneous Daily   Continuous Infusions:   PRN Meds:.acetaminophen **OR** acetaminophen, LORazepam, melatonin, ondansetron **OR** ondansetron (ZOFRAN) IV  Antimicrobials: Anti-infectives (From admission, onward)    Start     Dose/Rate Route Frequency Ordered Stop   10/18/20 1000  remdesivir 100 mg in sodium chloride 0.9 % 100 mL IVPB       See Hyperspace for full Linked Orders Report.   100 mg 200 mL/hr over 30 Minutes Intravenous Daily 10/17/20 1516 10/19/20 1105   10/17/20 1615  remdesivir 200 mg in sodium chloride 0.9% 250 mL IVPB       See Hyperspace for full Linked Orders Report.   200 mg 580 mL/hr over 30 Minutes Intravenous Once 10/17/20 1516 10/17/20 1815        I have personally reviewed the following labs and images: CBC: Recent Labs  Lab 10/14/20 1507 10/16/20 1441 10/17/20 0325 10/18/20 0544  WBC 7.4 9.3 7.0 4.8  NEUTROABS  --  7.5 5.0  --   HGB 12.6 13.0 12.6 13.2  HCT 38.6 40.4 38.7 41.0  MCV 88.9 91.4 89.8 91.5  PLT 261.0 281 250 243   BMP &GFR Recent Labs  Lab 10/14/20 1507 10/16/20 1441 10/17/20 0325 10/18/20 0544  NA 140 140 140 142  K 4.6 3.9 3.8 3.7  CL 106 105 108 112*  CO2 28 26 25 24   GLUCOSE 79 104* 89 83  BUN 20 16 14 16   CREATININE 1.06 0.94 0.84 0.77  CALCIUM 9.3 9.2 8.5* 8.4*   Estimated Creatinine Clearance: 45.8 mL/min (by C-G formula based on SCr of 0.77 mg/dL). Liver & Pancreas: Recent Labs  Lab 10/14/20 1507 10/16/20 1441 10/17/20 0325 10/18/20 0544 10/19/20 0439  AST 19 19 18 18 18   ALT 13 14 13 14 15   ALKPHOS 51 55 45 46 42  BILITOT 0.5 0.8 1.2 1.1 1.3*  PROT 6.9 6.9 6.1* 6.0* 5.7*  ALBUMIN 4.2 4.1 3.5 3.6 3.4*   No results for input(s): LIPASE, AMYLASE in the last 168 hours. No results for input(s): AMMONIA in the last 168 hours. Diabetic: Recent Labs    10/17/20 0325  HGBA1C 5.3   No results for input(s): GLUCAP in the last 168 hours. Cardiac  Enzymes: No results for input(s): CKTOTAL, CKMB, CKMBINDEX, TROPONINI in the last 168 hours. No results for input(s): PROBNP in the last 8760 hours. Coagulation Profile: No results for input(s): INR, PROTIME in the last 168 hours. Thyroid Function Tests: No results for input(s): TSH, T4TOTAL, FREET4, T3FREE, THYROIDAB in the last 72 hours. Lipid Profile: Recent Labs    10/17/20 0325  CHOL 212*  HDL 85  LDLCALC 121*  TRIG 29  CHOLHDL 2.5   Anemia Panel: No results for input(s): VITAMINB12, FOLATE, FERRITIN, TIBC, IRON, RETICCTPCT in the last 72 hours. Urine analysis:    Component Value Date/Time  COLORURINE YELLOW 10/16/2020 [place] 10/16/2020 1458   LABSPEC 1.015 10/16/2020 1458   PHURINE 5.0 10/16/2020 1458   GLUCOSEU NEGATIVE 10/16/2020 1458   HGBUR NEGATIVE 10/16/2020 [place] 10/16/2020 1458   BILIRUBINUR [place] 05/14/2014 1641   KETONESUR 5 (A) 10/16/2020 1458   PROTEINUR NEGATIVE 10/16/2020 1458   UROBILINOGEN 0.2 05/14/2014 1641   NITRITE NEGATIVE 10/16/2020 1458   LEUKOCYTESUR NEGATIVE 10/16/2020 1458   Sepsis Labs: Invalid input(s): PROCALCITONIN, [place]  Microbiology: Recent Results (from the past 240 hour(s))  Resp Panel by RT-PCR (Flu A&B, Covid) Nasopharyngeal Swab     Status: Abnormal   Collection Time: 10/16/20  6:43 PM   Specimen: Nasopharyngeal Swab; Nasopharyngeal(NP) swabs in vial transport medium  Result Value Ref Range Status   SARS Coronavirus 2 by RT PCR POSITIVE (A) NEGATIVE Final    Comment: RESULT CALLED TO, READ BACK BY AND VERIFIED WITH: RN COURTNEY L. 10/16/20@21 :17 BY TW (NOTE) SARS-CoV-2 target nucleic acids are DETECTED.  The SARS-CoV-2 RNA is generally detectable in upper respiratory specimens during the acute phase of infection. Positive results are indicative of the presence of the identified virus, but do not rule out bacterial infection or co-infection with other pathogens not detected  by the test. Clinical correlation with patient history and other diagnostic information is necessary to determine patient infection status. The expected result is Negative.  Fact Sheet for Patients: EntrepreneurPulse.com.au  Fact Sheet for Healthcare Providers: IncredibleEmployment.be  This test is not yet approved or cleared by the [place] FDA and  has been authorized for detection and/or diagnosis of SARS-CoV-2 by FDA under an Emergency Use Authorization (EUA).  This EUA will remain in effect (meaning this test can b e used) for the duration of  the COVID-19 declaration under Section 564(b)(1) of the Act, 21 U.S.C. section 360bbb-3(b)(1), unless the authorization is terminated or revoked sooner.     Influenza A by PCR NEGATIVE NEGATIVE Final   Influenza B by PCR NEGATIVE NEGATIVE Final    Comment: (NOTE) The Xpert Xpress SARS-CoV-2/FLU/RSV plus assay is intended as an aid in the diagnosis of influenza from Nasopharyngeal swab specimens and should not be used as a sole basis for treatment. Nasal washings and aspirates are unacceptable for Xpert Xpress SARS-CoV-2/FLU/RSV testing.  Fact Sheet for Patients: EntrepreneurPulse.com.au  Fact Sheet for Healthcare Providers: IncredibleEmployment.be  This test is not yet approved or cleared by the [place] FDA and has been authorized for detection and/or diagnosis of SARS-CoV-2 by FDA under an Emergency Use Authorization (EUA). This EUA will remain in effect (meaning this test can be used) for the duration of the COVID-19 declaration under Section 564(b)(1) of the Act, 21 U.S.C. section 360bbb-3(b)(1), unless the authorization is terminated or revoked.  Performed at [facility], [place] [address], [place], [place] [address]     Radiology Studies: No results found.   Ojas Coone T. [place]  If 7PM-7AM, please contact  night-coverage [email or web address] 10/19/2020, 2:46 PM

## 2020-10-19 NOTE — Progress Notes (Signed)
Inpatient Rehab Admissions Coordinator:   I spoke with Pt.'s husband regarding potential CIR admit. He states that they are interested and that family can provide 24/7 support at discharge. He is aware that she cannot admit until she is off COVID precautions (10 days after her positive test, which was 9/22).  Clemens Catholic, [place], [place] Admissions Coordinator  [phone] ([place]) [phone] (office)

## 2020-10-19 NOTE — Progress Notes (Signed)
Inpatient Rehab Admissions Coordinator:    CIR consult received. Patients are eligible to be considered for admit to the [facility] when cleared from airborne precautions by Acute MD, or Infectious Disease MD.  Otherwise, they will need to be >20 days from their positive test with recovery/improvement in symptoms or 2 negative tests.  I hve reached out to acute MD to see when he thinks Pt. May be able to come off precautions.   Clemens Catholic, [place], [place] Admissions Coordinator  [phone] ([place]) [phone] (office)

## 2020-10-20 DIAGNOSIS — I639 Cerebral infarction, unspecified: Secondary | ICD-10-CM | POA: Diagnosis not present

## 2020-10-20 DIAGNOSIS — U071 COVID-19: Secondary | ICD-10-CM | POA: Diagnosis not present

## 2020-10-20 DIAGNOSIS — I10 Essential (primary) hypertension: Secondary | ICD-10-CM | POA: Diagnosis not present

## 2020-10-20 DIAGNOSIS — E785 Hyperlipidemia, unspecified: Secondary | ICD-10-CM | POA: Diagnosis not present

## 2020-10-20 NOTE — Progress Notes (Signed)
Physical Therapy Treatment Patient Details Name: Latoya Wright MRN: [id number] DOB: 11/26/1945 Today's Date: 10/20/2020   History of Present Illness Patient is a 75 y/o female who presents with right sided weakness and speech difficulties 10/16/20. 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 PMH includes left THA 08/2018,    PT Comments    Patient received in bed, pleasant and cooperative; reports she is feeling better and has been able to take herself to the bathroom. Able to mobilize well in room without AD- actually better than with RW today- but did need cues for safety and sequencing especially when going from stand to sit. Tells me she does have 24/7 support at home. Refuses remaining in the recliner and was left in bed with all needs met this afternoon. Recovering quickly enough that I question if she really needs CIR at this point- may benefit more from skilled HHPT f/u instead as long as she does have 24/7 support.     Recommendations for follow up therapy are one component of a multi-disciplinary discharge planning process, led by the attending physician.  Recommendations may be updated based on patient status, additional functional criteria and insurance authorization.  Follow Up Recommendations  Home health PT;Supervision/Assistance - 24 hour     Equipment Recommendations  None recommended by PT    Recommendations for Other Services       Precautions / Restrictions Precautions Precautions: Fall Restrictions Weight Bearing Restrictions: No     Mobility  Bed Mobility Overal bed mobility: Needs Assistance Bed Mobility: Supine to Sit;Sit to Supine     Supine to sit: HOB elevated;Modified independent (Device/Increase time) Sit to supine: Modified independent (Device/Increase time);HOB elevated   General bed mobility comments: Mod(I)/use of rails    Transfers Overall transfer level: Needs assistance Equipment used: None Transfers: Sit to/from Stand Sit to Stand: Min  guard         General transfer comment: min guard for safety, cues for hand placement and sequencing- tends to be an unsafe stand to sitter and needs cues to get close enough to surface before sitting down  Ambulation/Gait Ambulation/Gait assistance: Min guard Gait Distance (Feet): 100 Feet Assistive device: None Gait Pattern/deviations: Step-through pattern;Decreased dorsiflexion - right;Decreased step length - left;Decreased weight shift to right;Narrow base of support Gait velocity: decreased   General Gait Details: started gait training with RW, however she had trouble controlling R side of walker and frequently crashed into obstacles; actually did much better without RW and was able to maintain more straight line navigation but definitely with R sided weakness and some loss of DF/foot drag R foot.   Stairs             Wheelchair Mobility    Modified Rankin (Stroke Patients Only)       Balance Overall balance assessment: Needs assistance Sitting-balance support: Feet supported;No upper extremity supported Sitting balance-Leahy Scale: Normal Sitting balance - Comments: able to put on socks in long sitting   Standing balance support: No upper extremity supported;During functional activity Standing balance-Leahy Scale: Fair                              Cognition Arousal/Alertness: Awake/alert Behavior During Therapy: WFL for tasks assessed/performed Overall Cognitive Status: Within Functional Limits for tasks assessed  General Comments: appropriate and follows commands well, does have decreased safety awareness but responds well to cues although she does have limited carryover      Exercises      General Comments        Pertinent Vitals/Pain Pain Assessment: No/denies pain Faces Pain Scale: No hurt Pain Intervention(s): Limited activity within patient's tolerance;Monitored during session    Home  Living                      Prior Function            PT Goals (current goals can now be found in the care plan section) Acute Rehab PT Goals Patient Stated Goal: to return to PLOF PT Goal Formulation: With patient/family Time For Goal Achievement: 10/31/20 Potential to Achieve Goals: Good Progress towards PT goals: Progressing toward goals    Frequency    Min 4X/week      PT Plan Discharge plan needs to be updated;Frequency needs to be updated    Co-evaluation              AM-PAC PT "6 Clicks" Mobility   Outcome Measure  Help needed turning from your back to your side while in a flat bed without using bedrails?: None Help needed moving from lying on your back to sitting on the side of a flat bed without using bedrails?: None Help needed moving to and from a bed to a chair (including a wheelchair)?: A Little Help needed standing up from a chair using your arms (e.g., wheelchair or bedside chair)?: A Little Help needed to walk in hospital room?: A Little Help needed climbing 3-5 steps with a railing? : A Little 6 Click Score: 20    End of Session   Activity Tolerance: Patient tolerated treatment well Patient left: in bed;with call bell/phone within reach Nurse Communication: Mobility status PT Visit Diagnosis: Unsteadiness on feet (R26.81);Hemiplegia and hemiparesis Hemiplegia - Right/Left: Right Hemiplegia - dominant/non-dominant: Dominant Hemiplegia - caused by: Cerebral infarction     Time: 3888-2800 PT Time Calculation (min) (ACUTE ONLY): 19 min  Charges:  $Gait Training: 8-22 mins                    Windell Norfolk, DPT, PN2   Supplemental Physical Therapist [place]    Pager [phone] Acute Rehab Office [phone]

## 2020-10-20 NOTE — Progress Notes (Signed)
Inpatient Rehab Admissions Coordinator:   I do not have a CIR bed for this Pt. Today. Per acute MD. Pt.to remain in COVID precautions for 10 days since her positive test on 9/22. She will potentially come off precautions 10/2. I will follow for potential admit if Pt. Continues to need intensive therapies at that time.  Clemens Catholic, [place], [place] Admissions Coordinator  [phone] ([place]) [phone] (office)

## 2020-10-20 NOTE — Evaluation (Signed)
Speech Language Pathology Evaluation Patient Details Name: Latoya Wright MRN: [id number] DOB: 1945/11/05 Today's Date: 10/20/2020 Time: 1030-1100 SLP Time Calculation (min) (ACUTE ONLY): 30 min  Problem List:  Patient Active Problem List   Diagnosis Date Noted   Acute CVA (cerebrovascular accident) ([place]) - with RUE > RLE weakness and expressive aphasia 10/16/2020   Underweight 10/14/2020   Stress 10/14/2020   Allergic rhinitis 10/01/2020   Lipoma 08/21/2020   Osteoporosis 08/21/2020   Femoral fracture ([place]) 09/24/2018   Left displaced femoral neck fracture ([place]) 09/23/2018   Pulmonary nodules 04/10/2014   Thyroid nodule 12/12/2013   Hemangioma of liver 12/12/2013   Past Medical History:  Past Medical History:  Diagnosis Date   Allergy    Arthritis    Past Surgical History:  Past Surgical History:  Procedure Laterality Date   TOTAL HIP ARTHROPLASTY 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   Procedure: TOTAL HIP ARTHROPLASTY ANTERIOR APPROACH;  Surgeon: Mcarthur Rossetti, MD;  Location: [facility];  Service: Orthopedics;  Laterality: Left;   HPI:  Latoya Wright is a 75 y.o. presenting with sudden onset aphasia and right-sided weakness.  MRI brain showed an acute infarct at left corona radiata and basal ganglia.   Assessment / Plan / Recommendation Clinical Impression  Pt presents with baseline receptive and expressive language and cognitive skills. Pt's primary deficits are speech related, with reduced intelligibility and low vocal intensity. When pt was encouraged to slow rate of speech and overarticulate, her intelligibility improved. She was unable to vary her level of loudness significantly. Pt would benefit from continued speech therapy intervention targeting improved breath support, increased loudness, and strategies to maximize intelligibility (slow rate, overarticulation). Continued ST intervention after acute hospitalization would also be beneficial.    SLP Assessment  SLP  Recommendation/Assessment: Patient needs continued Speech [facility] SLP Visit Diagnosis: Dysarthria and anarthria (R47.1)    Recommendations for follow up therapy are one component of a multi-disciplinary discharge planning process, led by the attending physician.  Recommendations may be updated based on patient status, additional functional criteria and insurance authorization.    Follow Up Recommendations  Inpatient Rehab    Frequency and Duration min 2x/week  2 weeks      SLP Evaluation Cognition  Overall Cognitive Status: Within Functional Limits for tasks assessed Arousal/Alertness: Awake/alert Orientation Level: Oriented X4       Comprehension  Auditory Comprehension Overall Auditory Comprehension: Appears within functional limits for tasks assessed    Expression Expression Primary Mode of Expression: Verbal Verbal Expression Overall Verbal Expression: Appears within functional limits for tasks assessed Initiation: No impairment Level of Generative/Spontaneous Verbalization: Sentence Repetition: No impairment Naming: No impairment Pragmatics: No impairment Interfering Components: Speech intelligibility Non-Verbal Means of Communication: Not applicable Written Expression Dominant Hand: Right   Oral / Motor  Oral Motor/Sensory Function Overall Oral Motor/Sensory Function: Mild impairment Facial ROM: Reduced right Facial Symmetry: Abnormal symmetry right Facial Strength: Reduced right Facial Sensation: Within Functional Limits Lingual ROM: Within Functional Limits Lingual Symmetry: Abnormal symmetry right Lingual Strength: Reduced Lingual Sensation: Within Functional Limits Velum: Within Functional Limits Mandible: Within Functional Limits Motor Speech Overall Motor Speech: Impaired Respiration: Within functional limits Phonation: Low vocal intensity Resonance: Within functional limits Articulation: Impaired Level of Impairment:  Phrase Intelligibility: Intelligibility reduced Word: 75-100% accurate Phrase: 75-100% accurate Sentence: 75-100% accurate Conversation: 75-100% accurate Motor Planning: Witnin functional limits Motor Speech Errors: Not applicable Effective Techniques: Slow rate;Increased vocal intensity;Over-articulate;Pause   GO  Carmine Carrozza B. Quentin Ore, [facility], [place] Speech Language Pathologist Office: [phone]  Shonna Chock 10/20/2020, 11:50 AM

## 2020-10-20 NOTE — Progress Notes (Signed)
PROGRESS NOTE  Latoya Wright PHX:[id number] DOB: Jun 16, 1945   PCP: Vivi Barrack, MD  Patient is from: Home.  Independently ambulates at baseline.  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  MRA head and neck, CUS and TTE without significant finding.  LDL 121.  HDL 85.  A1c 5.3%.  Neurology recommended permissive hypertension and Plavix and aspirin for 3 weeks followed by aspirin alone.  Therapy recommended CIR.  Patient incidentally tested positive for COVID 19.  Not symptomatic.  She is vaccinated including booster.  Started on remdesivir for 3 days.  Not able to go to CIR until she is off precaution.  Subjective: Seen and examined earlier this morning.  No major events overnight of this morning.  No complaints but frustrated about her COVID status preventing from early CIR transfer.   Objective: Vitals:   10/20/20 0020 10/20/20 0022 10/20/20 0500 10/20/20 0542  BP: (!) 151/87   124/79  Pulse: 86 86    Resp: 15   14  Temp: 98 F (36.7 C)   97.6 F (36.4 C)  TempSrc: Oral   Oral  SpO2: 98%   97%  Weight:   48 kg    No intake or output data in the 24 hours ending 10/20/20 1255  Filed Weights   10/18/20 0500 10/19/20 0500 10/20/20 0500  Weight: 47.2 kg 47.8 kg 48 kg    Examination:  GENERAL: No apparent distress.  Nontoxic. HEENT: MMM.  Vision and hearing grossly intact.  NECK: Supple.  No apparent JVD.  RESP: 97% on RA.  No IWOB.  Fair aeration bilaterally. CVS:  RRR. Heart sounds normal.  ABD/GI/GU: BS+. Abd soft, NTND.  MSK/EXT:  Moves extremities. No apparent deformity. No edema.  SKIN: no apparent skin lesion or  wound NEURO: Awake and alert. Oriented appropriately.  Some dysarthria with expressive aphasia.  Subtle right facial droop and right hemiparesis but significantly improved. PSYCH: Calm. Normal affect.   Procedures:  None  Microbiology summarized: COVID-19 PCR positive.  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  Risk factors include HTN, HLD, age, sex and recent COVID-78 infection.  MRA head/neck, CUS and TTE without significant finding.  LDL 121.  HDL 85.  A1c 5.3%.  Dysarthria, expressive aphasia, right facial droop, right-sided weakness and right pronator drift improved. -Appreciate neurology recommendations   -Permissive HTN for now and normalize BP over 5 to 7 days  -DAPT with Plavix and aspirin for 3 weeks followed by aspirin alone  -Continue Lipitor 40 mg daily -PT/OT recommended CIR.  COVID-19 infection-incidental finding.  Vaccinated including booster.  Not symptomatic from this -Complete 3 days of remdesivir -Isolation precaution for 10 days, through 10/2  Essential hypertension -As above.  Hyperlipidemia -As above.  Underweight Body mass index is 16.09 kg/m.  -Consult dietitian       DVT prophylaxis:  enoxaparin (LOVENOX) injection 30 mg Start: 10/17/20 1000 SCDs Start: 10/16/20 2244  Code Status: Full code Family Communication: Updated patient's husband at bedside. Level of care: Med-Surg Status is: Inpatient  Remains inpatient appropriate because:Unsafe d/c plan  Dispo: The patient is from: Home              Anticipated d/c is to: CIR              Patient currently is medically stable to d/c.   Difficult to place patient No       Consultants:  Neurology   Sch Meds:  Scheduled Meds:  aspirin EC  81 mg Oral Daily   atorvastatin  40 mg Oral QHS   clopidogrel  75 mg Oral Daily   enoxaparin (LOVENOX) injection  30 mg Subcutaneous Daily   Continuous  Infusions:   PRN Meds:.acetaminophen **OR** acetaminophen, LORazepam, melatonin, ondansetron **OR** ondansetron (ZOFRAN) IV  Antimicrobials: Anti-infectives (From admission, onward)    Start     Dose/Rate Route Frequency Ordered Stop   10/18/20 1000  remdesivir 100 mg in sodium chloride 0.9 % 100 mL IVPB       See Hyperspace for full Linked Orders Report.   100 mg 200 mL/hr over 30 Minutes Intravenous Daily 10/17/20 1516 10/19/20 1105   10/17/20 1615  remdesivir 200 mg in sodium chloride 0.9% 250 mL IVPB       See Hyperspace for full Linked Orders Report.   200 mg 580 mL/hr over 30 Minutes Intravenous Once 10/17/20 1516 10/17/20 1815        I have personally reviewed the following labs and images: CBC: Recent Labs  Lab 10/14/20 1507 10/16/20 1441 10/17/20 0325 10/18/20 0544  WBC 7.4 9.3 7.0 4.8  NEUTROABS  --  7.5 5.0  --   HGB 12.6 13.0 12.6 13.2  HCT 38.6 40.4 38.7 41.0  MCV 88.9 91.4 89.8 91.5  PLT 261.0 281 250 243   BMP &GFR Recent Labs  Lab 10/14/20 1507 10/16/20 1441 10/17/20 0325 10/18/20 0544  NA 140 140 140 142  K 4.6 3.9 3.8 3.7  CL 106 105 108 112*  CO2 28 26 25 24   GLUCOSE 79 104* 89 83  BUN 20 16 14 16   CREATININE 1.06 0.94 0.84 0.77  CALCIUM 9.3 9.2 8.5* 8.4*   Estimated Creatinine Clearance: 46 mL/min (by C-G formula based on SCr of 0.77 mg/dL). Liver & Pancreas: Recent Labs  Lab 10/14/20 1507 10/16/20 1441 10/17/20 0325 10/18/20 0544 10/19/20 0439  AST 19 19 18 18 18   ALT 13 14 13 14 15   ALKPHOS 51 55 45 46 42  BILITOT 0.5 0.8 1.2 1.1 1.3*  PROT 6.9 6.9 6.1* 6.0* 5.7*  ALBUMIN 4.2 4.1 3.5 3.6 3.4*   No results for input(s): LIPASE, AMYLASE in the last 168 hours. No results for input(s): AMMONIA in the last 168 hours. Diabetic: No results for input(s): HGBA1C in the last 72 hours.  No results for input(s): GLUCAP in the last 168 hours. Cardiac Enzymes: No results for input(s): CKTOTAL, CKMB, CKMBINDEX, TROPONINI in the last  168 hours. No results for input(s): PROBNP in the last 8760 hours. Coagulation Profile: No results for input(s): INR, PROTIME in the last 168 hours. Thyroid Function Tests: No results for input(s): TSH, T4TOTAL, FREET4, T3FREE, THYROIDAB in the last 72 hours. Lipid Profile: No results for input(s): CHOL, HDL, LDLCALC, TRIG, CHOLHDL, LDLDIRECT in the last 72 hours.  Anemia Panel: No results for input(s): VITAMINB12, FOLATE, FERRITIN, TIBC, IRON, RETICCTPCT in the last 72 hours. Urine analysis:    Component Value Date/Time   COLORURINE YELLOW 10/16/2020 [place] 10/16/2020 1458   LABSPEC 1.015 10/16/2020 1458   PHURINE 5.0 10/16/2020  [place] 10/16/2020 [place] 10/16/2020 [place] 10/16/2020 1458   BILIRUBINUR [place] 05/14/2014 1641   KETONESUR 5 (A) 10/16/2020 1458   PROTEINUR NEGATIVE 10/16/2020 1458   UROBILINOGEN 0.2 05/14/2014 1641   NITRITE NEGATIVE 10/16/2020 1458   LEUKOCYTESUR NEGATIVE 10/16/2020 1458   Sepsis Labs: Invalid input(s): PROCALCITONIN, [place]  Microbiology: Recent Results (from the past 240 hour(s))  Resp Panel by RT-PCR (Flu A&B, Covid) Nasopharyngeal Swab     Status: Abnormal   Collection Time: 10/16/20  6:43 PM   Specimen: Nasopharyngeal Swab; Nasopharyngeal(NP) swabs in vial transport medium  Result Value Ref Range Status   SARS Coronavirus 2 by RT PCR POSITIVE (A) NEGATIVE Final    Comment: RESULT CALLED TO, READ BACK BY AND VERIFIED WITH: RN COURTNEY L. 10/16/20@21 :17 BY TW (NOTE) SARS-CoV-2 target nucleic acids are DETECTED.  The SARS-CoV-2 RNA is generally detectable in upper respiratory specimens during the acute phase of infection. Positive results are indicative of the presence of the identified virus, but do not rule out bacterial infection or co-infection with other pathogens not detected by the test. Clinical correlation with patient history and other diagnostic  information is necessary to determine patient infection status. The expected result is Negative.  Fact Sheet for Patients: EntrepreneurPulse.com.au  Fact Sheet for Healthcare Providers: IncredibleEmployment.be  This test is not yet approved or cleared by the [place] FDA and  has been authorized for detection and/or diagnosis of SARS-CoV-2 by FDA under an Emergency Use Authorization (EUA).  This EUA will remain in effect (meaning this test can b e used) for the duration of  the COVID-19 declaration under Section 564(b)(1) of the Act, 21 U.S.C. section 360bbb-3(b)(1), unless the authorization is terminated or revoked sooner.     Influenza A by PCR NEGATIVE NEGATIVE Final   Influenza B by PCR NEGATIVE NEGATIVE Final    Comment: (NOTE) The Xpert Xpress SARS-CoV-2/FLU/RSV plus assay is intended as an aid in the diagnosis of influenza from Nasopharyngeal swab specimens and should not be used as a sole basis for treatment. Nasal washings and aspirates are unacceptable for Xpert Xpress SARS-CoV-2/FLU/RSV testing.  Fact Sheet for Patients: EntrepreneurPulse.com.au  Fact Sheet for Healthcare Providers: IncredibleEmployment.be  This test is not yet approved or cleared by the [place] FDA and has been authorized for detection and/or diagnosis of SARS-CoV-2 by FDA under an Emergency Use Authorization (EUA). This EUA will remain in effect (meaning this test can be used) for the duration of the COVID-19 declaration under Section 564(b)(1) of the Act, 21 U.S.C. section 360bbb-3(b)(1), unless the authorization is terminated or revoked.  Performed at [facility], [place] [address], [place], [place] [address]     Radiology Studies: No results found.   Sira Adsit T. [place]  If 7PM-7AM, please contact night-coverage [email or web address] 10/20/2020, 12:55 PM

## 2020-10-20 NOTE — Progress Notes (Signed)
Occupational Therapy Treatment Patient Details Name: ANAMIKA KUEKER MRN: [id number] DOB: Jun 07, 1945 Today's Date: 10/20/2020   History of present illness Patient is a 75 y/o female who presents with right sided weakness and speech difficulties 10/16/20. 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 Pt continues to be limited by RUE weakness, coordination, fine motor tasks which impacts her IADLs. Attempted hand writing this session, letters, shapes, and copying without success. Pt became emotional during difficult tasks and stated "I don't know why I am crying." Pt demonstrated fair in hand manipulation tasks. Reviewed theraputty exercises and pt demonstrated good understanding. Pt continues to benefit from continued OT acutely. D/c recommendation remains appropriate.    Recommendations for follow up therapy are one component of a multi-disciplinary discharge planning process, led by the attending physician.  Recommendations may be updated based on patient status, additional functional criteria and insurance authorization.    Follow Up Recommendations  CIR - If pt d/c's home, she will benefit from [facility], or OP OT to gain function of her R hand.    Equipment Recommendations  None recommended by OT       Precautions / Restrictions Precautions Precautions: Fall Restrictions Weight Bearing Restrictions: No       Mobility Bed Mobility Overal bed mobility: Needs Assistance Bed Mobility: Supine to Sit;Sit to Supine     Supine to sit: HOB elevated;Modified independent (Device/Increase time) Sit to supine: Modified independent (Device/Increase time);HOB elevated   General bed mobility comments: Mod(I)/use of rails    Transfers Overall transfer level: Needs assistance Equipment used: Rolling walker (2 wheeled) Transfers: Sit  to/from Stand Sit to Stand: Min guard         General transfer comment: min guard for safety, verbal cues for proper postioning    Balance Overall balance assessment: Needs assistance Sitting-balance support: Feet supported;No upper extremity supported Sitting balance-Leahy Scale: Normal Sitting balance - Comments: able to put on socks in long sitting   Standing balance support: No upper extremity supported;During functional activity Standing balance-Leahy Scale: Fair                             ADL either performed or assessed with clinical judgement   ADL Overall ADL's : Needs assistance/impaired Eating/Feeding: Set up Eating/Feeding Details (indicate cue type and reason): required assist fro packaging - may also benefit frmo a built up handle Grooming: Set up;Sitting Grooming Details (indicate cue type and reason): set up if LUE is used, min-mod A for RUE use                 Toilet Transfer: Min guard;Ambulation;RW           Functional mobility during ADLs: Min guard;Rolling walker;Cueing for safety General ADL Comments: pt completes functional ambulation and gross motor tasks well wit min guard A and verbal cues for safety - she requires min-mod A for fine motor tasks      Cognition Arousal/Alertness: Awake/alert Behavior During Therapy: WFL for tasks assessed/performed Overall Cognitive Status: Impaired/Different from baseline               General Comments: Poor safety awareness. poor emotional regulation. Pt emotional throughout session when a task was difficult and states "i do not know why I am crying"  Exercises Exercises: Other exercises Other Exercises Other Exercises: Theraputty gross grasp, R hand Other Exercises: R hand finger extension theraputty log roll out Other Exercises: R digits pinching theraputty      General Comments VSS on RA. Pt became emotional during session and could not regulate, RN notifed     Pertinent Vitals/ Pain       Pain Assessment: No/denies pain Faces Pain Scale: No hurt Pain Intervention(s): Limited activity within patient's tolerance;Monitored during session   Frequency  Min 2X/week        Progress Toward Goals  OT Goals(current goals can now be found in the care plan section)  Progress towards OT goals: Progressing toward goals  Acute Rehab OT Goals Patient Stated Goal: to return to PLOF OT Goal Formulation: With patient Time For Goal Achievement: 10/31/20 Potential to Achieve Goals: Good ADL Goals Pt Will Perform Grooming: with modified independence;standing Pt Will Perform Lower Body Bathing: with supervision;sitting/lateral leans;sit to/from stand Pt Will Perform Lower Body Dressing: with supervision;sitting/lateral leans;sit to/from stand Pt Will Transfer to Toilet: with supervision;ambulating Pt Will Perform Toileting - Clothing Manipulation and hygiene: with supervision;sitting/lateral leans;sit to/from stand  Plan Discharge plan remains appropriate       AM-PAC OT "6 Clicks" Daily Activity     Outcome Measure   Help from another person eating meals?: A Little Help from another person taking care of personal grooming?: A Little Help from another person toileting, which includes using toliet, bedpan, or urinal?: A Lot Help from another person bathing (including washing, rinsing, drying)?: A Lot Help from another person to put on and taking off regular upper body clothing?: A Little Help from another person to put on and taking off regular lower body clothing?: A Lot 6 Click Score: 15    End of Session Equipment Utilized During Treatment: Rolling walker (theraputty)  OT Visit Diagnosis: Unsteadiness on feet (R26.81);Other abnormalities of gait and mobility (R26.89);Muscle weakness (generalized) (M62.81);Apraxia (R48.2)   Activity Tolerance Patient tolerated treatment well   Patient Left in bed;with call bell/phone within reach;with  family/visitor present   Nurse Communication Mobility status        Time: 2637-8588 OT Time Calculation (min): 24 min  Charges: OT General Charges $OT Visit: 1 Visit OT Treatments $Therapeutic Activity: 23-37 mins    Braylen Denunzio A Ladarian Bonczek 10/20/2020, 5:00 PM

## 2020-10-21 DIAGNOSIS — R636 Underweight: Secondary | ICD-10-CM | POA: Diagnosis not present

## 2020-10-21 DIAGNOSIS — I10 Essential (primary) hypertension: Secondary | ICD-10-CM | POA: Diagnosis not present

## 2020-10-21 DIAGNOSIS — I639 Cerebral infarction, unspecified: Secondary | ICD-10-CM | POA: Diagnosis not present

## 2020-10-21 DIAGNOSIS — E785 Hyperlipidemia, unspecified: Secondary | ICD-10-CM | POA: Diagnosis not present

## 2020-10-21 MED ORDER — CLOPIDOGREL BISULFATE 75 MG PO TABS: 75.0000 mg | ORAL_TABLET | Freq: Every day | ORAL | 0 refills | Status: AC

## 2020-10-21 MED ORDER — OSCAL 500/200 D-3 500-200 MG-UNIT PO TABS: 1.0000 | ORAL_TABLET | Freq: Two times a day (BID) | ORAL | 3 refills | Status: DC

## 2020-10-21 MED ORDER — ATORVASTATIN CALCIUM 40 MG PO TABS: 40.0000 mg | ORAL_TABLET | Freq: Every day | ORAL | 1 refills | Status: DC

## 2020-10-21 MED ORDER — ASPIRIN 81 MG PO TBEC: 81.0000 mg | DELAYED_RELEASE_TABLET | Freq: Every day | ORAL | 11 refills | Status: AC

## 2020-10-21 NOTE — Progress Notes (Signed)
Inpatient Rehab Admissions Coordinator:    Note that PT is now recommending [place]. It seems unlikely that Pt. Will continue to need CIR at the end of her isolation precautions as she appears to be progressing rapidly. I will reach out to pt. And her husband to discuss CIR vs discharge home with [facility].  Clemens Catholic, [place], [place] Admissions Coordinator  [phone] ([place]) [phone] (office)

## 2020-10-21 NOTE — TOC Transition Note (Signed)
Transition of Care [facility]) - CM/SW Discharge Note   Patient Details  Name: Latoya Wright MRN: [id number] Date of Birth: [birth year]  Transition of Care [facility]) CM/SW Contact:  Pollie Friar, RN Phone Number: 10/21/2020, 10:07 AM   Clinical Narrative:    Patient is discharging home [facility] through [facility]. Pt provided choice through [email or web address] information on AVS.  Pt asking for walker for home. Pt received a walker 2 years ago so her insurance wont cover the cost. Spouse to obtain a walker outside the hospital setting.  Spouse to provide supervision at home and transportation to home.   Final next level of care: Home w Home Health Services Barriers to Discharge: No Barriers Identified   Patient Goals and CMS Choice   CMS Medicare.gov Compare Post Acute Care list provided to:: Patient Choice offered to / list presented to : Patient  Discharge Placement                       Discharge Plan and Services                          HH Arranged: PT, OT, Speech Therapy HH Agency: [place] Date [place]: 10/21/20   Representative spoke with at [place]: [place] ([place]) Interventions     Readmission Risk Interventions No flowsheet data found.

## 2020-10-21 NOTE — Discharge Summary (Signed)
Physician Discharge Summary  Latoya Wright QZE:[id number] DOB: [birth year] DOA: 10/16/2020  PCP: Vivi Barrack, MD  Admit date: 10/16/2020 Discharge date: 10/21/2020 Admitted From: Home Disposition: Home Recommendations for Outpatient Follow-up:  Follow ups as below. Please obtain CBC/BMP/Mag at follow up Please follow up on the following pending results: None Home Health: PT/OT/SLP Equipment/Devices: None Discharge Condition: Stable CODE STATUS: Full code  Follow-up Information     [facility]. Schedule an appointment as soon as possible for a visit in 1 month(s).   Specialty: Neurology Why: stroke clinic Contact information: [address] [place] [phone]        Vivi Barrack, MD. Schedule an appointment as soon as possible for a visit in 1 week(s).   Specialty: Family Medicine Contact information: [place] [address] [phone]         [facility], [place] Follow up.   Specialty: Home Health Services Why: The home health agency will contact you for the first home visit. Contact information: 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  MRA head and neck, CUS and TTE without significant finding.  LDL 121.  HDL 85.  A1c 5.3%.  Neurology recommended permissive hypertension and Plavix and aspirin for 3 weeks followed by aspirin alone.  Therapy initially recommended CIR.  However, patient's symptoms improved significantly, and and the recommendation has changed to home health.    Patient incidentally tested positive for COVID 19.  Not symptomatic.  She is vaccinated including booster.  Completed 3 days of remdesivir in  house.  See individual problem list below for more on hospital course.  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  Risk factors include HTN, HLD, age, sex and recent COVID-62 infection.  MRA head/neck, CUS and TTE without significant finding.  LDL 121.  HDL 85.  A1c 5.3%.  Significant improvement in her dysarthria, expressive aphasia, right facial droop, right-sided weakness and right pronator drift. -Appreciate neurology recommendations                   -Permissive HTN for now and normalize BP over 5 to 7 days-completed.             -DAPT with Plavix and aspirin for 3 weeks followed by aspirin alone             -Continue Lipitor 40 mg daily -HH PT/OT/SLP ordered. -Outpatient follow-up with PCP and neurology as above   COVID-19 infection-incidental finding.  Vaccinated including booster.  Completed 3 days of remdesivir.  Completes her 5 days of isolation per CDC recommendations.   Essential hypertension -As above.   Hyperlipidemia -As above.   Underweight Body mass index is 15.08 kg/m.           Discharge Exam: Vitals:   10/20/20 2320 10/21/20 0435 10/21/20 0438 10/21/20 1226  BP: 140/76  (!) 150/77 (!) 125/95  Pulse: 68  68 74  Temp: 98.6 F (37 C)  98.7 F (37.1 C) (!) 97.3 F (36.3 C)  Resp: 18  18 17   Weight:  45 kg  SpO2: 100%  97% 100%  TempSrc: Oral  Oral Oral  BMI (Calculated):  15.09       GENERAL: No apparent distress.  Nontoxic. HEENT: MMM.  Vision and hearing grossly intact.  NECK: Supple.  No apparent JVD.  RESP: On RA.  No IWOB.  Fair aeration bilaterally. CVS:  RRR. Heart sounds normal.  ABD/GI/GU: Bowel sounds present. Soft. Non tender.  MSK/EXT:  Moves extremities. No apparent deformity. No edema.  SKIN: no apparent skin lesion or wound NEURO: Awake, alert and oriented appropriately.  Subtle dysarthria, aphasia, right facial droop, right pronator  drift and right hemiparesis 4+/5 in right upper and right lower extremities.  Sensory intact in all dermatomes.  Patellar reflex symmetric. Finger to nose intact. PSYCH: Calm. Normal affect.   Discharge Instructions  Discharge Instructions     Ambulatory referral to Neurology   Complete by: As directed    Follow up with stroke clinic NP (Jessica Vanschaick or Cecille Rubin, if both not available, consider Zachery Dauer, or A[place]) at [facility] in about 4 weeks. Thanks.   Diet - low sodium heart healthy   Complete by: As directed    Discharge instructions   Complete by: As directed    It has been a pleasure taking care of you!  You were hospitalized with right-sided weakness and slurred speech due to stroke.  Your symptoms improved.  We are discharging you on Plavix, aspirin and atorvastatin to reduce your risk of another stroke.  Please take your medications as prescribed.    Take care,   Increase activity slowly   Complete by: As directed       Allergies as of 10/21/2020       Reactions   Dayquil [pseudoephedrine-apap-dm]    jittery        Medication List     STOP taking these medications    CALCIUM 500 PO       TAKE these medications    aspirin 81 MG EC tablet Take 1 tablet (81 mg total) by mouth daily. Swallow whole.   atorvastatin 40 MG tablet Commonly known as: LIPITOR Take 1 tablet (40 mg total) by mouth at bedtime.   Biotin 5 MG Tabs Take 5 mg by mouth daily.   clopidogrel 75 MG tablet Commonly known as: PLAVIX Take 1 tablet (75 mg total) by mouth daily for 20 days.   Oscal 500/200 D-3 500-200 MG-UNIT tablet Generic drug: calcium-vitamin D Take 1 tablet by mouth 2 (two) times daily.   triamcinolone ointment 0.5 % Commonly known as: KENALOG Apply 1 application topically 2 (two) times daily.   Vitamin D (Cholecalciferol) 10 MCG (400 UNIT) Caps Take 400 Units by mouth daily.        Consultations: Neurology  Procedures/Studies:   CT HEAD  WO CONTRAST (5MM)  Result Date: 10/16/2020 CLINICAL DATA:  TIA. EXAM: CT HEAD WITHOUT CONTRAST TECHNIQUE: Contiguous axial images were obtained from the base of the skull through the vertex without intravenous contrast. COMPARISON:  October 15, 2014 FINDINGS: Brain: No evidence of acute infarction, hemorrhage, hydrocephalus, extra-axial collection or mass lesion/mass effect. Cavum septum pellucidum et vergae is again seen. Vascular: No hyperdense vessel or unexpected calcification. Skull: Normal. Negative for fracture or focal lesion. Sinuses/Orbits: No acute finding. Other: None. IMPRESSION: No acute intracranial abnormality. Electronically Signed   By: Virgina Norfolk M.D.   On: 10/16/2020 18:07   MR ANGIO HEAD WO CONTRAST  Result Date: 10/16/2020 CLINICAL DATA:  Neuro deficit, acute, stroke suspected EXAM:  MRI HEAD WITHOUT CONTRAST MRA HEAD WITHOUT CONTRAST TECHNIQUE: Multiplanar, multi-echo pulse sequences of the brain and surrounding structures were acquired without intravenous contrast. Angiographic images of the Circle of Willis were acquired using MRA technique without intravenous contrast. COMPARISON:  No pertinent prior exam. FINDINGS: MRI HEAD Brain: There is reduced diffusion involving the left corona radiata and basal ganglia. No evidence of intracranial hemorrhage. There is no intracranial mass or mass effect. Ventricles and sulci are normal in size and configuration, noting cavum septum pellucidum et vergae. Patchy foci of T2 hyperintensity in the supratentorial white matter are nonspecific but may reflect mild chronic microvascular ischemic changes. There is no hydrocephalus or extra-axial fluid collection. Vascular: Major vessel flow voids at the skull base are preserved. Skull and upper cervical spine: Normal marrow signal is preserved. Sinuses/Orbits: Paranasal sinuses are aerated. Orbits are unremarkable. Other: Sella is unremarkable.  Mastoid air cells are clear. MRA HEAD Intracranial  internal carotid arteries are patent. Middle and anterior cerebral arteries are patent. Left A1 ACA is dominant. Intracranial vertebral arteries, basilar artery, posterior cerebral arteries are patent. Left posterior communicating artery is present. There is no significant stenosis or aneurysm. 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  No hemorrhage. Mild chronic microvascular ischemic changes. No proximal intracranial vessel occlusion or significant stenosis. Electronically Signed   By: Macy Mis M.D.   On: 10/16/2020 20:34   MR BRAIN WO CONTRAST  Result Date: 10/16/2020 CLINICAL DATA:  Neuro deficit, acute, stroke suspected EXAM: MRI HEAD WITHOUT CONTRAST MRA HEAD WITHOUT CONTRAST TECHNIQUE: Multiplanar, multi-echo pulse sequences of the brain and surrounding structures were acquired without intravenous contrast. Angiographic images of the Circle of Willis were acquired using MRA technique without intravenous contrast. COMPARISON:  No pertinent prior exam. FINDINGS: MRI HEAD Brain: There is reduced diffusion involving the left corona radiata and basal ganglia. No evidence of intracranial hemorrhage. There is no intracranial mass or mass effect. Ventricles and sulci are normal in size and configuration, noting cavum septum pellucidum et vergae. Patchy foci of T2 hyperintensity in the supratentorial white matter are nonspecific but may reflect mild chronic microvascular ischemic changes. There is no hydrocephalus or extra-axial fluid collection. Vascular: Major vessel flow voids at the skull base are preserved. Skull and upper cervical spine: Normal marrow signal is preserved. Sinuses/Orbits: Paranasal sinuses are aerated. Orbits are unremarkable. Other: Sella is unremarkable.  Mastoid air cells are clear. MRA HEAD Intracranial internal carotid arteries are patent. Middle and anterior cerebral arteries are patent. Left A1 ACA is dominant. Intracranial vertebral arteries, basilar  artery, posterior cerebral arteries are patent. Left posterior communicating artery is present. There is no significant stenosis or aneurysm. 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  No hemorrhage. Mild chronic microvascular ischemic changes. No proximal intracranial vessel occlusion or significant stenosis. Electronically Signed   By: Macy Mis M.D.   On: 10/16/2020 20:34   VAS US CAROTID  Result Date: 10/18/2020 Carotid Arterial Duplex Study Patient Name:  DE LIBMAN  Date of Exam:   10/17/2020 Medical Rec #: [id number]    Accession #:    [id number] Date of Birth: 1945-06-24    Patient Gender: F Patient Age:   46 years Exam Location:  [facility] Procedure:      VAS US CAROTID Referring Phys: ERIC CHEN --------------------------------------------------------------------------------  Indications: CVA. Performing Technologist: Archie Patten RVS  Examination Guidelines: A complete evaluation includes B-mode imaging, spectral Doppler, color Doppler, and power Doppler as needed of all  accessible portions of each vessel. Bilateral testing is considered an integral part of a complete examination. Limited examinations for reoccurring indications may be performed as noted.  Right Carotid Findings: +----------+--------+--------+--------+------------------+--------+           PSV cm/sEDV cm/sStenosisPlaque DescriptionComments +----------+--------+--------+--------+------------------+--------+ CCA Prox  88      16              heterogenous               +----------+--------+--------+--------+------------------+--------+ CCA Distal80      18              heterogenous               +----------+--------+--------+--------+------------------+--------+ ICA Prox  86      22      1-39%   heterogenous               +----------+--------+--------+--------+------------------+--------+ ICA Distal107     30                                          +----------+--------+--------+--------+------------------+--------+ ECA       103     9                                          +----------+--------+--------+--------+------------------+--------+ +----------+--------+-------+--------+-------------------+           PSV cm/sEDV cmsDescribeArm Pressure (mmHG) +----------+--------+-------+--------+-------------------+ [id number]                                         +----------+--------+-------+--------+-------------------+ +---------+--------+--+--------+--+---------+ VertebralPSV cm/s64EDV cm/s14Antegrade +---------+--------+--+--------+--+---------+  Left Carotid Findings: +----------+--------+--------+--------+------------------+--------+           PSV cm/sEDV cm/sStenosisPlaque DescriptionComments +----------+--------+--------+--------+------------------+--------+ CCA Prox  94      21              heterogenous               +----------+--------+--------+--------+------------------+--------+ CCA Distal94      23              heterogenous               +----------+--------+--------+--------+------------------+--------+ ICA Prox  94      28      1-39%   heterogenous               +----------+--------+--------+--------+------------------+--------+ ICA Distal107     38                                         +----------+--------+--------+--------+------------------+--------+ ECA       85                                                 +----------+--------+--------+--------+------------------+--------+ +----------+--------+--------+--------+-------------------+           PSV cm/sEDV cm/sDescribeArm Pressure (mmHG) +----------+--------+--------+--------+-------------------+ [id number]                                          +----------+--------+--------+--------+-------------------+ +---------+--------+--+--------+--+---------+  VertebralPSV cm/s76EDV cm/s16Antegrade  +---------+--------+--+--------+--+---------+   Summary: Right Carotid: Velocities in the right ICA are consistent with a 1-39% stenosis. Left Carotid: Velocities in the left ICA are consistent with a 1-39% stenosis. Vertebrals: Bilateral vertebral arteries demonstrate antegrade flow. *See table(s) above for measurements and observations.  Electronically signed by Antony Contras MD on 10/18/2020 at 12:39:24 PM.    Final    ECHOCARDIOGRAM LIMITED  Result Date: 10/17/2020    ECHOCARDIOGRAM LIMITED REPORT   Patient Name:   KEANDRA MEDERO Date of Exam: 10/17/2020 Medical Rec #:  [id number]   Height:       68.0 in Accession #:    [id number]  Weight:       104.4 lb Date of Birth:  01/07/1946   BSA:          1.550 m Patient Age:    64 years    BP:           149/63 mmHg Patient Gender: F           HR:           67 bpm. Exam Location:  Inpatient Procedure: Limited Color Doppler, Limited Echo and Cardiac Doppler Indications:    stroke  History:        Patient has no prior history of Echocardiogram examinations.  Sonographer:    Johny Chess RDCS Referring Phys: [phone] ERIC CHEN  Sonographer Comments: Image acquisition challenging due to respiratory motion. IMPRESSIONS  1. Left ventricular ejection fraction, by estimation, is 60 to 65%. The left ventricle has normal function. The left ventricle has no regional wall motion abnormalities. Left ventricular diastolic parameters are indeterminate.  2. Right ventricular systolic function is normal. The right ventricular size is normal. There is normal pulmonary artery systolic pressure.  3. The mitral valve is grossly normal. Trivial mitral valve regurgitation. No evidence of mitral stenosis.  4. The aortic valve was not well visualized. Aortic valve regurgitation is not visualized. No aortic stenosis is present. Comparison(s): No prior Echocardiogram. Conclusion(s)/Recommendation(s): Limited study as patient unable to tolerate procedure. Visualized structures without significant  abnormalities. No cardiac source of embolism noted. Unable to assess for shunts on current study. Consider TEE if clinically  indicated. FINDINGS  Left Ventricle: Left ventricular ejection fraction, by estimation, is 60 to 65%. The left ventricle has normal function. The left ventricle has no regional wall motion abnormalities. The left ventricular internal cavity size was normal in size. There is  no left ventricular hypertrophy. Left ventricular diastolic parameters are indeterminate. Right Ventricle: The right ventricular size is normal. Right vetricular wall thickness was not well visualized. Right ventricular systolic function is normal. There is normal pulmonary artery systolic pressure. The tricuspid regurgitant velocity is 2.51 m/s, and with an assumed right atrial pressure of 0 mmHg, the estimated right ventricular systolic pressure is 16.3 mmHg. Left Atrium: Left atrial size was not well visualized. Right Atrium: Right atrial size was not well visualized. Pericardium: The pericardium was not well visualized. Mitral Valve: The mitral valve is grossly normal. Trivial mitral valve regurgitation. No evidence of mitral valve stenosis. Tricuspid Valve: The tricuspid valve is grossly normal. Tricuspid valve regurgitation is trivial. No evidence of tricuspid stenosis. Aortic Valve: The aortic valve was not well visualized. Aortic valve regurgitation is not visualized. No aortic stenosis is present. Pulmonic Valve: The pulmonic valve was not well visualized. Venous: The inferior vena cava was not well visualized. IAS/Shunts: The interatrial septum was not well visualized. LEFT VENTRICLE PLAX 2D LVIDd:  3.10 cm  Diastology LVIDs:         2.00 cm  LV e' medial:    7.18 cm/s LV PW:         0.70 cm  LV E/e' medial:  18.2 LV IVS:        0.70 cm  LV e' lateral:   9.25 cm/s LVOT diam:     1.50 cm  LV E/e' lateral: 14.2 LV SV:         25 LV SV Index:   16 LVOT Area:     1.77 cm  LEFT ATRIUM         Index LA diam:     2.00 cm 1.29 cm/m  AORTIC VALVE LVOT Vmax:   85.20 cm/s LVOT Vmean:  53.400 cm/s LVOT VTI:    0.141 m MITRAL VALVE                TRICUSPID VALVE MV Area (PHT): 3.31 cm     TR Peak grad:   25.2 mmHg MV Decel Time: 229 msec     TR Vmax:        251.00 cm/s MV E velocity: 131.00 cm/s MV A velocity: 137.00 cm/s  SHUNTS MV E/A ratio:  0.96         Systemic VTI:  0.14 m                             Systemic Diam: 1.50 cm Buford Dresser MD Electronically signed by Buford Dresser MD Signature Date/Time: 10/17/2020/3:09:55 PM    Final        The results of significant diagnostics from this hospitalization (including imaging, microbiology, ancillary and laboratory) are listed below for reference.     Microbiology: Recent Results (from the past 240 hour(s))  Resp Panel by RT-PCR (Flu A&B, Covid) Nasopharyngeal Swab     Status: Abnormal   Collection Time: 10/16/20  6:43 PM   Specimen: Nasopharyngeal Swab; Nasopharyngeal(NP) swabs in vial transport medium  Result Value Ref Range Status   SARS Coronavirus 2 by RT PCR POSITIVE (A) NEGATIVE Final    Comment: RESULT CALLED TO, READ BACK BY AND VERIFIED WITH: RN COURTNEY L. 10/16/20@21 :17 BY TW (NOTE) SARS-CoV-2 target nucleic acids are DETECTED.  The SARS-CoV-2 RNA is generally detectable in upper respiratory specimens during the acute phase of infection. Positive results are indicative of the presence of the identified virus, but do not rule out bacterial infection or co-infection with other pathogens not detected by the test. Clinical correlation with patient history and other diagnostic information is necessary to determine patient infection status. The expected result is Negative.  Fact Sheet for Patients: EntrepreneurPulse.com.au  Fact Sheet for Healthcare Providers: IncredibleEmployment.be  This test is not yet approved or cleared by the [place] FDA and  has been authorized for detection  and/or diagnosis of SARS-CoV-2 by FDA under an Emergency Use Authorization (EUA).  This EUA will remain in effect (meaning this test can b e used) for the duration of  the COVID-19 declaration under Section 564(b)(1) of the Act, 21 U.S.C. section 360bbb-3(b)(1), unless the authorization is terminated or revoked sooner.     Influenza A by PCR NEGATIVE NEGATIVE Final   Influenza B by PCR NEGATIVE NEGATIVE Final    Comment: (NOTE) The Xpert Xpress SARS-CoV-2/FLU/RSV plus assay is intended as an aid in the diagnosis of influenza from Nasopharyngeal swab specimens and should not be used as a sole basis  for treatment. Nasal washings and aspirates are unacceptable for Xpert Xpress SARS-CoV-2/FLU/RSV testing.  Fact Sheet for Patients: EntrepreneurPulse.com.au  Fact Sheet for Healthcare Providers: IncredibleEmployment.be  This test is not yet approved or cleared by the [place] FDA and has been authorized for detection and/or diagnosis of SARS-CoV-2 by FDA under an Emergency Use Authorization (EUA). This EUA will remain in effect (meaning this test can be used) for the duration of the COVID-19 declaration under Section 564(b)(1) of the Act, 21 U.S.C. section 360bbb-3(b)(1), unless the authorization is terminated or revoked.  Performed at [facility], [place] [address] [place], [place] [address]      Labs:  CBC: Recent Labs  Lab 10/16/20 1441 10/17/20 0325 10/18/20 0544  WBC 9.3 7.0 4.8  NEUTROABS 7.5 5.0  --   HGB 13.0 12.6 13.2  HCT 40.4 38.7 41.0  MCV 91.4 89.8 91.5  PLT 281 250 243   BMP &GFR Recent Labs  Lab 10/16/20 1441 10/17/20 0325 10/18/20 0544  NA 140 140 142  K 3.9 3.8 3.7  CL 105 108 112*  CO2 26 25 24   GLUCOSE 104* 89 83  BUN 16 14 16   CREATININE 0.94 0.84 0.77  CALCIUM 9.2 8.5* 8.4*   Estimated Creatinine Clearance: 43.2 mL/min (by C-G formula based on SCr of 0.77 mg/dL). Liver & Pancreas: Recent  Labs  Lab 10/16/20 1441 10/17/20 0325 10/18/20 0544 10/19/20 0439  AST 19 18 18 18   ALT 14 13 14 15   ALKPHOS 55 45 46 42  BILITOT 0.8 1.2 1.1 1.3*  PROT 6.9 6.1* 6.0* 5.7*  ALBUMIN 4.1 3.5 3.6 3.4*   No results for input(s): LIPASE, AMYLASE in the last 168 hours. No results for input(s): AMMONIA in the last 168 hours. Diabetic: No results for input(s): HGBA1C in the last 72 hours. No results for input(s): GLUCAP in the last 168 hours. Cardiac Enzymes: No results for input(s): CKTOTAL, CKMB, CKMBINDEX, TROPONINI in the last 168 hours. No results for input(s): PROBNP in the last 8760 hours. Coagulation Profile: No results for input(s): INR, PROTIME in the last 168 hours. Thyroid Function Tests: No results for input(s): TSH, T4TOTAL, FREET4, T3FREE, THYROIDAB in the last 72 hours. Lipid Profile: No results for input(s): CHOL, HDL, LDLCALC, TRIG, CHOLHDL, LDLDIRECT in the last 72 hours. Anemia Panel: No results for input(s): VITAMINB12, FOLATE, FERRITIN, TIBC, IRON, RETICCTPCT in the last 72 hours. Urine analysis:    Component Value Date/Time   COLORURINE YELLOW 10/16/2020 [place] 10/16/2020 1458   LABSPEC 1.015 10/16/2020 1458   PHURINE 5.0 10/16/2020 1458   GLUCOSEU NEGATIVE 10/16/2020 1458   HGBUR NEGATIVE 10/16/2020 1458   BILIRUBINUR NEGATIVE 10/16/2020 1458   BILIRUBINUR [place] 05/14/2014 1641   KETONESUR 5 (A) 10/16/2020 1458   PROTEINUR NEGATIVE 10/16/2020 1458   UROBILINOGEN 0.2 05/14/2014 1641   NITRITE NEGATIVE 10/16/2020 1458   LEUKOCYTESUR NEGATIVE 10/16/2020 1458   Sepsis Labs: Invalid input(s): PROCALCITONIN, LACTICIDVEN   Time coordinating discharge: 40 minutes  SIGNED:  Mercy Riding, MD  [facility] 10/21/2020, 3:36 PM

## 2020-10-21 NOTE — Progress Notes (Signed)
Inpatient Rehab Admissions Coordinator:    PT is now recommending [place] and Pt.s husband is on board to take her home. CIR to sign off.   Clemens Catholic, [place], [place] Admissions Coordinator  [phone] ([place]) [phone] (office)

## 2020-10-21 NOTE — Progress Notes (Signed)
Husband has to be reminded often that he is unable to assist pt up to the bathroom.  He becomes  very angry but has been reminded that pt is a fall risk and can fall and get injured.  He will say ok then tried to get her up unassisted again.  Each time the alarm goes off though and pt stops trying to get up but husband gets upset about it.  He has been entering and exiting room without a gown and mask on and is reminded each time that he has to have them on at all times.  He then puts on gown and walks down the hall with gown on and has to be reminded that he must remove gown just outside her door and sanitize his hands before leaving.  He says ok then does it over and over again.  He keeps trying to turn off bed exit alarm and also keeps trying to get her up unassisted.  He does appear to be forgetful,as he does say no one ever told him and I have reminded him at least 4-5 times tonight.

## 2020-10-21 NOTE — Progress Notes (Signed)
Physical Therapy Treatment Patient Details Name: Latoya Wright MRN: [id number] DOB: [birth year] Today's Date: 10/21/2020   History of Present Illness Patient is a 75 y/o female who presents with right sided weakness and speech difficulties 10/16/20. 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    PT Comments    Pt received seated EOB and family present in room, agreeable to therapy session and with good participation in gait training and instruction on safety with mobility and fall risk prevention. Gait belt obtained for pt and emphasis on safety with transfers, increased R awareness/attention and assistive device use. Recommend pt obtain a standard cane for home as she is unsafe with RW due to decreased R awareness/weakness. Overall fair balance but pt will need 24/7 supervision/physical assist for safety and pt family instructed on guarding positions.   Recommendations for follow up therapy are one component of a multi-disciplinary discharge planning process, led by the attending physician.  Recommendations may be updated based on patient status, additional functional criteria and insurance authorization.  Follow Up Recommendations  Home health PT;Supervision/Assistance - 24 hour     Equipment Recommendations  Cane (standard cane, gait belt (obtained belt for pt))    Recommendations for Other Services       Precautions / Restrictions Precautions Precautions: Fall Precaution Comments: decreased R awareness Restrictions Weight Bearing Restrictions: No     Mobility  Bed Mobility Overal bed mobility: Needs Assistance             General bed mobility comments: pt recieved/remained EOB    Transfers Overall transfer level: Needs assistance Equipment used: Rolling walker (2 wheeled);None;Straight cane Transfers: Sit to/from Stand Sit to Stand: Min guard;Min assist         General transfer comment: min guard for safety, verbal cues for proper  postioning with RW/cane and no AD; poor eccentric control to sit needs up to minA for stand>sit  Ambulation/Gait Ambulation/Gait assistance: Min guard Gait Distance (Feet): 90 Feet (34ft, 76ft, 5ft, 4ft) Assistive device: Rolling walker (2 wheeled);Straight cane;None Gait Pattern/deviations: Step-through pattern;Decreased step length - left;Decreased weight shift to right;Narrow base of support;Decreased dorsiflexion - left;Decreased dorsiflexion - right Gait velocity: decreased Gait velocity interpretation: 1.31 - 2.62 ft/sec, indicative of limited community ambulator General Gait Details: started gait training with RW per pt/family request, however pt with decreased R awareness and tending to bump objects on that side; actually did much better without RW (using cane vs HHA) and was able to maintain more straight line navigation but definitely with R sided weakness and some loss of DF/foot drag R foot. Heavily emphasized attention to R side with pt/family.   Stairs             Wheelchair Mobility    Modified Rankin (Stroke Patients Only) Modified Rankin (Stroke Patients Only) Pre-Morbid Rankin Score: No symptoms Modified Rankin: Moderately severe disability     Balance Overall balance assessment: Needs assistance Sitting-balance support: Feet supported;No upper extremity supported Sitting balance-Leahy Scale: Normal     Standing balance support: No upper extremity supported;During functional activity Standing balance-Leahy Scale: Fair Standing balance comment: increased assist with turning (min guard) but close Supervision to min guard walking without external support                            Cognition Arousal/Alertness: Awake/alert Behavior During Therapy: WFL for tasks assessed/performed Overall Cognitive Status: Impaired/Different from baseline Area of Impairment:  Memory;Safety/judgement;Problem solving                     Memory: Decreased  short-term memory   Safety/Judgement: Decreased awareness of safety;Decreased awareness of deficits   Problem Solving: Difficulty sequencing;Requires verbal cues General Comments: Poor safety awareness, decreased R sided awareness. Family (sister and spouse) present and supportive. Pt dysarthric but able to make herself understood wtih increased time.      Exercises Hand Exercises Opposition: AROM;Right;Standing;5 reps    General Comments General comments (skin integrity, edema, etc.): VSS on RA      Pertinent Vitals/Pain Pain Assessment: No/denies pain    Home Living                      Prior Function            PT Goals (current goals can now be found in the care plan section) Acute Rehab PT Goals Patient Stated Goal: to return to PLOF PT Goal Formulation: With patient/family Time For Goal Achievement: 10/31/20 Potential to Achieve Goals: Good Progress towards PT goals: Progressing toward goals    Frequency    Min 4X/week      PT Plan Current plan remains appropriate    Co-evaluation              AM-PAC PT "6 Clicks" Mobility   Outcome Measure  Help needed turning from your back to your side while in a flat bed without using bedrails?: None Help needed moving from lying on your back to sitting on the side of a flat bed without using bedrails?: None Help needed moving to and from a bed to a chair (including a wheelchair)?: A Little Help needed standing up from a chair using your arms (e.g., wheelchair or bedside chair)?: A Little Help needed to walk in hospital room?: A Little Help needed climbing 3-5 steps with a railing? : A Little 6 Click Score: 20    End of Session Equipment Utilized During Treatment: Gait belt Activity Tolerance: Patient tolerated treatment well Patient left: in bed;with call bell/phone within reach;with nursing/sitter in room;with family/visitor present (pt seated EOB, family members with her, NT in room) Nurse  Communication: Mobility status;Other (comment) (pt will need a cane for home (may need to pick up)) PT Visit Diagnosis: Unsteadiness on feet (R26.81);Hemiplegia and hemiparesis Hemiplegia - Right/Left: Right Hemiplegia - dominant/non-dominant: Dominant Hemiplegia - caused by: Cerebral infarction     Time: 1200-1225 PT Time Calculation (min) (ACUTE ONLY): 25 min  Charges:  $Gait Training: 8-22 mins $Therapeutic Activity: 8-22 mins                     Mckynlee Luse P., PTA Acute Rehabilitation Services Pager: [phone] Office: [place] 10/21/2020, 1:02 PM

## 2020-10-21 NOTE — Plan of Care (Signed)
  Problem: Education: Goal: Knowledge of General Education information will improve Description: Including pain rating scale, medication(s)/side effects and non-pharmacologic comfort measures Outcome: Completed/Met   Problem: Health Behavior/Discharge Planning: Goal: Ability to manage health-related needs will improve Outcome: Completed/Met   Problem: Clinical Measurements: Goal: Ability to maintain clinical measurements within normal limits will improve Outcome: Completed/Met   Problem: Clinical Measurements: Goal: Will remain free from infection Outcome: Completed/Met   Problem: Clinical Measurements: Goal: Diagnostic test results will improve Outcome: Completed/Met   Problem: Clinical Measurements: Goal: Respiratory complications will improve Outcome: Completed/Met   Problem: Clinical Measurements: Goal: Cardiovascular complication will be avoided Outcome: Completed/Met   

## 2020-10-21 NOTE — Plan of Care (Signed)

## 2020-10-22 ENCOUNTER — Telehealth: Payer: Self-pay

## 2020-10-22 NOTE — Telephone Encounter (Signed)
Pt's husband called stating that Latoya Wright had a stroke last week and was admitted to the hospital. Atleigh's discharge papers state for her to be seen around 10/5. Dr Marigene Ehlers next appt is a same day on 10/10 at 1:00. Can pt wait until 10/10. Please Advise.

## 2020-10-22 NOTE — Telephone Encounter (Signed)
Please schedule 10/4 340 slot - take both acute/virtual slots

## 2020-10-22 NOTE — Telephone Encounter (Cosign Needed)
Transition Care Management Unsuccessful Follow-up Telephone Call  Date of discharge and from where:  10/21/20 [facility]   Attempts:  1st Attempt  Reason for unsuccessful TCM follow-up call:  Left voice message

## 2020-10-22 NOTE — Telephone Encounter (Signed)
Pt is scheduled °

## 2020-10-23 ENCOUNTER — Telehealth: Payer: Self-pay

## 2020-10-23 NOTE — Telephone Encounter (Cosign Needed)
Transition Care Management Follow-up Telephone Call Date of discharge and from where: [facility] 10/21/20 How have you been since you were released from the hospital? Doing pretty good Any questions or concerns? No  Items Reviewed: Did the pt receive and understand the discharge instructions provided? Yes  Medications obtained and verified? Yes  Other? No  Any new allergies since your discharge? No  Dietary orders reviewed? Yes Do you have support at home? Yes   Home Care and Equipment/Supplies: Were home health services ordered? yes If so, what is the name of the agency? [facility]  Has the agency set up a time to come to the patient's home? yes Were any new equipment or medical supplies ordered?  No What is the name of the medical supply agency?  Were you able to get the supplies/equipment? not applicable Do you have any questions related to the use of the equipment or supplies? No  Functional Questionnaire: (I = Independent and D = Dependent) ADLs: I  Bathing/Dressing- I  Meal Prep- I  Eating- I  Maintaining continence- I  Transferring/Ambulation- I  Managing Meds- I Husband assist as needed  Follow up appointments reviewed:  PCP Hospital f/u appt confirmed? Yes  Scheduled to see Dr Jerline Pain  on 10/28/20 @ 3:40. [facility] f/u appt confirmed? Yes  Scheduled to see [name]  on 12/10/20 @ 2:15. Are transportation arrangements needed? No  If their condition worsens, is the pt aware to call PCP or go to the Emergency Dept.? Yes Was the patient provided with contact information for the PCP's office or ED? Yes Was to pt encouraged to call back with questions or concerns? Yes

## 2020-10-28 ENCOUNTER — Encounter: Payer: Self-pay | Admitting: Family Medicine

## 2020-10-28 ENCOUNTER — Ambulatory Visit: Payer: Medicare PPO | Admitting: Family Medicine

## 2020-10-28 ENCOUNTER — Other Ambulatory Visit: Payer: Self-pay

## 2020-10-28 VITALS — BP 165/75 | HR 78 | Temp 98.0°F | Ht 68.0 in | Wt 102.8 lb

## 2020-10-28 DIAGNOSIS — F4321 Adjustment disorder with depressed mood: Secondary | ICD-10-CM | POA: Diagnosis not present

## 2020-10-28 DIAGNOSIS — I639 Cerebral infarction, unspecified: Secondary | ICD-10-CM | POA: Diagnosis not present

## 2020-10-28 DIAGNOSIS — R636 Underweight: Secondary | ICD-10-CM | POA: Diagnosis not present

## 2020-10-28 NOTE — Progress Notes (Signed)
Chief Complaint:  Latoya Wright is a 75 y.o. female who presents today for a TCM visit.  Assessment/Plan:  New/Acute Problems: Elevated BP Reading Home readings have been in the 120s to 130s over 70s.  She will continue to monitor at home and let me know if persistently elevated.  She will check with me in a week or so on MyChart. Consider starting low dose amlodipine if persistently elevated.   Chronic Problems Addressed Today: Acute CVA (cerebrovascular accident) ([place]) - with RUE > RLE weakness and expressive aphasia Improving since her hospitalization.  She will be following up with neurology in several weeks.  Discussed her current medication regimen and need for risk reduction to prevent future stroke.  Patient was initially very hesitant to take any medications however she is agreeable to continue aspirin and atorvastatin for long-term use after discussion of risk and benefits of these meds.  She will follow-up with neurology in a few weeks.  She will continue working with PT, OT, and speech therapy.  Underweight Patient down a few pounds since her hospitalization.  She is very hesitant to take any medications to help with appetite at this point.  She will be following up with nutritionist. Encouraged high protein diet.   Adjustment disorder with depressed mood Patient with more anxiety and depression since her stroke a couple of weeks ago.  Discussed with patient this is very common to have issues after her stroke.  We discussed treatment options.  She is very hesitant to start medications.  She is willing to see a therapist.  Will place referral today. No red flags.  Discussed reasons to return to care.   Would benefit from remeron given her low appetite if she decides to start medications.      Subjective:  HPI:  Summary of Hospital admission: Reason for admission: CVA Date of admission: 10/16/2020 Date of discharge: 10/21/2020 Date of Interactive contact:  10/22/2020 Summary of Hospital course: She presented to the ED on 10/16/2020 with right side weakness and speech dificulty.  On route symptoms resolved and there was discussion about letting patient return home.  While in the ED her symptoms began to worsen again.  Code stroke was called.  She was found to have acute CVA of her left basal ganglia and corona radiata.  She was admitted for stroke work-up.  Neurology was consulted.  Her carotid ultrasound, TTE, and MRA were all negative.  She was started on dual antiplatelet therapy with aspirin and Plavix for 3 weeks followed by aspirin alone.  She was also started on atorvastatin.  Initially recommended CIR however patient's symptoms improved and she was discharged home with home health therapy.  Incidentally, patient was noted to be positive for COVID during admission.   Interim history:   She has had continued improvement with her speech and strength in her right hand over the last week or so since being discharged from the hospital.  She does admit to having more issues with anxiety and depressive thoughts.  She is concerned that the Plavix may be causing her have more issues with anxiety and depression.  She has been compliant with all her medications since discharge.  She has also lost a few pounds since being hospitalized.  We discussed this at her last office visit a few weeks ago.  She has not yet heard back from the nutritionist.   ROS: Per HPI, otherwise a complete review of systems was negative.   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   Lipoma 08/21/2020    Osteoporosis 08/21/2020   Femoral fracture ([place]) 09/24/2018   Left displaced femoral neck fracture ([place]) 09/23/2018   Pulmonary nodules 04/10/2014   Thyroid nodule 12/12/2013   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  Current Outpatient Medications  Medication Sig Dispense Refill   aspirin EC 81 MG EC tablet Take 1 tablet (81 mg total) by mouth daily. Swallow whole. 30 tablet 11   atorvastatin (LIPITOR) 40 MG tablet Take 1 tablet (40 mg total) by mouth at bedtime. 90 tablet 1   Biotin 5 MG TABS Take 5 mg by mouth daily.     calcium-vitamin D (OSCAL 500/200 D-3) 500-200 MG-UNIT tablet Take 1 tablet by mouth 2 (two) times daily. 180 tablet 3   clopidogrel (PLAVIX) 75 MG tablet Take 1 tablet (75 mg total) by mouth daily for 20 days. 20 tablet 0   triamcinolone ointment (KENALOG) 0.5 % Apply 1 application topically 2 (two) times daily. 30 g 0   Vitamin D, Cholecalciferol, 10 MCG (400 UNIT) CAPS Take 400 Units by mouth daily.     No current facility-administered medications for this visit.    Allergies-reviewed and updated Allergies  Allergen Reactions   Dayquil [Pseudoephedrine-Apap-Dm]     jittery    Social History   Socioeconomic History   Marital status: Married    Spouse name: Not on file   Number of children: Not on file   Years of education: Not on file   Highest education level: Not on  file  Occupational History   Not on file  Tobacco Use   Smoking status: Never   Smokeless tobacco: Never  Substance and Sexual Activity   Alcohol use: No   Drug use: No   Sexual activity: Not on file  Other Topics Concern   Not on file  Social History Narrative   Not on file   Social Determinants of Health   Financial Resource Strain: Not on file  Food Insecurity: Not on file  Transportation Needs: Not on file  Physical Activity: Not on file  Stress: Not on file  Social Connections: Not on file        Objective:  Physical Exam: BP (!) 165/75   Pulse 78   Temp 98 F (36.7 C) (Temporal)   Ht 5\' 8"  (1.727 m)   Wt 102 lb 12.8 oz (46.6 kg)   SpO2 100%   BMI 15.63 kg/m   Gen: NAD, resting comfortably CV: RRR  with no murmurs appreciated Pulm: NWOB, CTAB with no crackles, wheezes, or rhonchi GI: Normal bowel sounds present. Soft, Nontender, Nondistended. MSK: No edema, cyanosis, or clubbing noted Skin: Warm, dry Neuro: Slightly dysarthric speech otherwise grossly normal, moves all extremities. CN 2-12 intact.  Psych: Normal affect and thought content        I,Savera Zaman,acting as a scribe for Dimas Chyle, MD.,have documented all relevant documentation on the behalf of Dimas Chyle, MD,as directed by  Dimas Chyle, MD while in the presence of Dimas Chyle, MD.   I, Dimas Chyle, MD, have reviewed all documentation for this visit. The documentation on 10/28/20 for the exam, diagnosis, procedures, and orders are all accurate and complete.  Time Spent: 50 minutes of total time was spent on the date of the encounter performing the following actions: chart review prior to seeing the patient including her recent hospitalization, obtaining history, performing a medically necessary exam, counseling on the treatment plan, placing orders, and documenting in our EHR.   Algis Greenhouse. Jerline Pain, MD 10/28/2020 4:23 PM

## 2020-10-28 NOTE — Patient Instructions (Signed)
It was very nice to see you today!  Please continue taking your Lipitor and baby aspirin.  It is very common for people to have issues with anxiety and depression for several months after suffering a stroke.  I would like for you to see a therapist.  Please let me know if you would like to start any medication.  We will place referral today.  Please let me know if you have any recurrent thoughts of hurting yourself or others.  Please keep an eye on your blood pressure and let me know if it is persistently 150/90 or higher in the next several weeks.  Please send me a message on MyChart in 1 to 2 weeks to let me know how you are doing.  Take care, Dr Jerline Pain  PLEASE NOTE:  If you had any lab tests please let us know if you have not heard back within a few days. You may see your results on mychart before we have a chance to review them but we will give you a call once they are reviewed by [place]. If we ordered any referrals today, please let us know if you have not heard from their office within the next week.   Please try these tips to maintain a healthy lifestyle:  Eat at least 3 REAL meals and 1-2 snacks per day.  Aim for no more than 5 hours between eating.  If you eat breakfast, please do so within one hour of getting up.   Each meal should contain half fruits/vegetables, one quarter protein, and one quarter carbs (no bigger than a computer mouse)  Cut down on sweet beverages. This includes juice, soda, and sweet tea.   Drink at least 1 glass of water with each meal and aim for at least 8 glasses per day  Exercise at least 150 minutes every week.

## 2020-10-28 NOTE — Assessment & Plan Note (Signed)
Patient down a few pounds since her hospitalization.  She is very hesitant to take any medications to help with appetite at this point.  She will be following up with nutritionist. Encouraged high protein diet.

## 2020-10-28 NOTE — Assessment & Plan Note (Addendum)
Improving since her hospitalization.  She will be following up with neurology in several weeks.  Discussed her current medication regimen and need for risk reduction to prevent future stroke.  Patient was initially very hesitant to take any medications however she is agreeable to continue aspirin and atorvastatin for long-term use after discussion of risk and benefits of these meds.  She will follow-up with neurology in a few weeks.  She will continue working with PT, OT, and speech therapy.

## 2020-10-28 NOTE — Assessment & Plan Note (Addendum)
Patient with more anxiety and depression since her stroke a couple of weeks ago.  Discussed with patient this is very common to have issues after her stroke.  We discussed treatment options.  She is very hesitant to start medications.  She is willing to see a therapist.  Will place referral today. No red flags.  Discussed reasons to return to care.   Would benefit from remeron given her low appetite if she decides to start medications.

## 2020-10-29 ENCOUNTER — Telehealth: Payer: Self-pay

## 2020-10-29 NOTE — Telephone Encounter (Signed)
As long has she has good support in place and it does not interfere with her rehab it will be safe for her to take a trip.   Algis Greenhouse. Jerline Pain, MD 10/29/2020 10:22 AM

## 2020-10-29 NOTE — Telephone Encounter (Signed)
Called and made Latoya Wright aware.

## 2020-10-29 NOTE — Telephone Encounter (Signed)
See below

## 2020-10-29 NOTE — Telephone Encounter (Signed)
Patient's husband is calling in wondering if Dr.Parker thinks it is safe for Latoya Wright to travel back to [employer].

## 2020-10-31 ENCOUNTER — Telehealth: Payer: Self-pay | Admitting: *Deleted

## 2020-10-31 NOTE — Telephone Encounter (Signed)
Flor form [facility] call for VO  PT 1 x per week for 5 weeks  VO given.

## 2020-10-31 NOTE — Telephone Encounter (Signed)
Ok with me. Please place any necessary orders. 

## 2020-11-04 ENCOUNTER — Telehealth: Payer: Self-pay

## 2020-11-04 NOTE — Telephone Encounter (Signed)
Ok with me. Please place any necessary orders. 

## 2020-11-04 NOTE — Telephone Encounter (Signed)
Ok to give VO? 

## 2020-11-04 NOTE — Telephone Encounter (Signed)
VO given.

## 2020-11-04 NOTE — Telephone Encounter (Signed)
Home Health Verbal Orders  Caller:Alden M.   Contact and title  Requesting Speech

## 2020-11-05 NOTE — Telephone Encounter (Signed)
Left message with VO

## 2020-11-06 ENCOUNTER — Telehealth: Payer: Self-pay

## 2020-11-06 NOTE — Telephone Encounter (Signed)
Patient's husband is calling in stating that they discussed with Dr.Parker about Latoya Wright being very jittery and anxious. Latoya Wright would like to go on medication to help with it, wondering if medication can be called in to [facility].

## 2020-11-06 NOTE — Telephone Encounter (Signed)
See note

## 2020-11-06 NOTE — Telephone Encounter (Signed)
Recommend starting remeron 7.5mg  nightly. Would like for them to check in with me in a couple of weeks.  Algis Greenhouse. Jerline Pain, MD 11/06/2020 10:08 AM

## 2020-11-07 MED ORDER — MIRTAZAPINE 7.5 MG PO TABS: 7.5000 mg | ORAL_TABLET | Freq: Every day | ORAL | 0 refills | Status: DC

## 2020-11-07 NOTE — Telephone Encounter (Signed)
Spouse has called back in stating that he just disconnected with the pharmacy.    Pharmacy is stating they do not have the script.  Is requesting script to be sent again.  I did verify that script is going to correct pharmacy.

## 2020-11-07 NOTE — Telephone Encounter (Signed)
Rx send to pharmacy  

## 2020-11-07 NOTE — Addendum Note (Signed)
Addended by: Betti Cruz on: 11/07/2020 09:12 AM   Modules accepted: Orders

## 2020-11-07 NOTE — Telephone Encounter (Signed)
See below

## 2020-11-07 NOTE — Telephone Encounter (Signed)
Patient called in and wants to make sure this can be sent in before the weekend. Please advise.

## 2020-11-07 NOTE — Telephone Encounter (Signed)
Patient's spouse has called back.  He is concerned about the pharmacy not receiving the script a second time if sent.  He would like to pick this script up.    Would like a phone call at [phone] once ready to pick up.

## 2020-11-07 NOTE — Telephone Encounter (Signed)
Rx was call in today to the pharmacy Phone:  [phone] Patient notified

## 2020-11-17 ENCOUNTER — Telehealth: Payer: Self-pay

## 2020-11-17 NOTE — Telephone Encounter (Signed)
Ok with me. Please place any necessary orders. 

## 2020-11-17 NOTE — Telephone Encounter (Signed)
See note

## 2020-11-17 NOTE — Telephone Encounter (Signed)
PT's husband called stating that Latoya Wright had a stroke about a month ago and she is currently in therapy. He wants to know if Latoya Wright can be referred somewhere else for physical therapy. He stated that she cannot use her right hand and the therapists are not working with her. Please Advise.

## 2020-11-18 ENCOUNTER — Other Ambulatory Visit: Payer: Self-pay | Admitting: *Deleted

## 2020-11-18 ENCOUNTER — Encounter: Payer: Self-pay | Admitting: Orthopaedic Surgery

## 2020-11-18 ENCOUNTER — Ambulatory Visit: Payer: Medicare PPO | Admitting: Orthopaedic Surgery

## 2020-11-18 DIAGNOSIS — M533 Sacrococcygeal disorders, not elsewhere classified: Secondary | ICD-10-CM | POA: Diagnosis not present

## 2020-11-18 DIAGNOSIS — I639 Cerebral infarction, unspecified: Secondary | ICD-10-CM

## 2020-11-18 NOTE — Telephone Encounter (Signed)
Referral placed.

## 2020-11-18 NOTE — Progress Notes (Signed)
The patient is someone who I replaced her hip in August 2020.  She is 75 years old and recently had a stroke.  She was falling recently and her husband caught her before she hit the ground but she did hit a chair and sustained a contusion to her coccyx area.  It was very painful a few weeks ago.  She did go to [facility] and x-rays were obtained.  There was no evidence of fracture per the report.  She was told to follow-up with [place] to make sure her hips are doing fine.  She walks without assistive device and denies any hip pain at all.  She has been sitting on a pillow for comfort and says of the coccyx pain is getting less.  She said that the ramifications and effects of her stroke affected her speech and writing and that is slowly improving.  On exam both her hips move smoothly and fluidly.  She gets out of the chair easily.  She is walking without a limp or assistive device.  There is pain to palpation at the base of the pelvis around the coccyx area.  She says it is only mildly tender and is much better than it was and seems to be improving as each week goes by.  From my standpoint this needs to be just watch conservatively.  Follow-up can really be as needed and less a month or so goes by and she still having significant pain or it is getting worse.  All questions and concerns were answered addressed.  Follow-up is as needed.

## 2020-11-19 ENCOUNTER — Ambulatory Visit: Payer: Medicare PPO | Admitting: Psychologist

## 2020-11-27 ENCOUNTER — Telehealth: Payer: Self-pay

## 2020-11-27 NOTE — Telephone Encounter (Signed)
Received a call from [employer] from [facility], they received a referral for physical therapy but the patient is needing OT and speech. Margreta Journey said she spoke with Dr.Parker who gave the okay to place new orders but they havent received anything.

## 2020-11-27 NOTE — Telephone Encounter (Signed)
Please advise 

## 2020-11-28 ENCOUNTER — Telehealth: Payer: Self-pay

## 2020-11-28 NOTE — Telephone Encounter (Signed)
Patient is calling back in regard.  Would like a call back once this has been sent over at [phone].

## 2020-11-28 NOTE — Telephone Encounter (Signed)
Ok with me. Please place any necessary orders. 

## 2020-12-01 ENCOUNTER — Telehealth: Payer: Self-pay

## 2020-12-01 ENCOUNTER — Other Ambulatory Visit: Payer: Self-pay | Admitting: *Deleted

## 2020-12-01 DIAGNOSIS — I639 Cerebral infarction, unspecified: Secondary | ICD-10-CM

## 2020-12-01 NOTE — Telephone Encounter (Signed)
Referral placed.

## 2020-12-01 NOTE — Telephone Encounter (Signed)
Latoya Wright from [facility] called to speak to a nurse about an order for an outpatient referral for speech therapy. She can be reached at [phone].

## 2020-12-03 ENCOUNTER — Ambulatory Visit: Payer: Medicare PPO | Admitting: Occupational Therapy

## 2020-12-03 ENCOUNTER — Ambulatory Visit: Payer: Medicare PPO | Attending: Family Medicine

## 2020-12-03 ENCOUNTER — Encounter: Payer: Self-pay | Admitting: Occupational Therapy

## 2020-12-03 ENCOUNTER — Other Ambulatory Visit: Payer: Self-pay

## 2020-12-03 DIAGNOSIS — R471 Dysarthria and anarthria: Secondary | ICD-10-CM | POA: Insufficient documentation

## 2020-12-03 DIAGNOSIS — I69351 Hemiplegia and hemiparesis following cerebral infarction affecting right dominant side: Secondary | ICD-10-CM | POA: Insufficient documentation

## 2020-12-03 DIAGNOSIS — R278 Other lack of coordination: Secondary | ICD-10-CM | POA: Insufficient documentation

## 2020-12-03 DIAGNOSIS — R1310 Dysphagia, unspecified: Secondary | ICD-10-CM | POA: Insufficient documentation

## 2020-12-03 DIAGNOSIS — M6281 Muscle weakness (generalized): Secondary | ICD-10-CM | POA: Diagnosis present

## 2020-12-03 NOTE — Therapy (Signed)
[facility] [address] [place], [place], [address] Phone: [phone]   Fax:  [phone]  Occupational Therapy Evaluation  Patient Details  Name: Latoya Wright MRN: [id number] Date of Birth: August 12, 1945 Referring Provider (OT): Vivi Barrack. MD   Encounter Date: 12/03/2020   OT End of Session - 12/03/20 1442     Visit Number 1    Number of Visits 17    Date for OT Re-Evaluation 01/28/21    Authorization Type [employer]    OT Start Time 1148    OT Stop Time 1230    OT Time Calculation (min) 42 min    Activity Tolerance Patient tolerated treatment well    Behavior During Therapy WFL for tasks assessed/performed             Past Medical History:  Diagnosis Date   Allergy    Arthritis     Past Surgical History:  Procedure Laterality Date   TOTAL HIP ARTHROPLASTY 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   Procedure: TOTAL HIP ARTHROPLASTY ANTERIOR APPROACH;  Surgeon: Mcarthur Rossetti, MD;  Location: [facility];  Service: Orthopedics;  Laterality: Left;    There were no vitals filed for this visit.   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  Pt lives with husband in a condo with 1 step to enter and is a retired Art therapist.  PMH: HTN and HLD. Pt will benefit from skilled occupational therapy services to address strength and coordination, ROM, GM/FM control, safety awareness, activity tolerance/endurance, introduction of compensatory strategies/AE prn, and implementation of an HEP to improve participation and safety during ADLs/IADLs and leisure tasks.    Pertinent History HTN and HLD    Patient Stated Goals I want to write               [facility] OT Assessment  - 12/03/20 1218       Assessment   Medical Diagnosis acute CVA    Referring Provider (OT) Vivi Barrack MD    Onset Date/Surgical Date 10/16/20    Hand Dominance Right    Next MD Visit 12/10/2020    Prior Therapy yes - HH      Precautions   Precautions Fall      Balance Screen   Has the patient fallen in the past 6 months Yes    How many times? 1    Has the patient had a decrease in activity level because of a fear of falling?  Yes    Is the patient reluctant to leave their home because of a fear of falling?  No      Home  Environment   Family/patient expects to be discharged to: Private residence    Living Arrangements Spouse/significant other    Available Help at Discharge Family    Type of Home Other (Comment)   [place]   1 step to enter   [employer] to live on main level with bedroom/bathroom    Bathroom Building control surveyor   built in shower seat and grab bars   Lives With Spouse      Prior Function   Level of [place]  Vocation Retired    Leisure gardening      ADL   Eating/Feeding Modified independent   increased time   Grooming Brushing hair   styling hair with increased time   ADL comments Mod I to Independent with all ADLs including fasteners      IADL   Light Housekeeping Does personal laundry completely    Meal Prep Plans, prepares and serves adequate meals independently    Medication Management Is responsible for taking medication in correct dosages at correct time      Written Expression   Dominant Hand Right    Handwriting 90% legible;Mild micrographia      Vision - History   Baseline Vision Wears glasses only for reading    Patient Visual Report --   Pt reports no impairments, but plans to follow up with eye doc before returning to driving     [place] Within Functional Limits    Vision Assessment Vision tested    Ocular Range of Motion Within Functional Limits     Tracking/Visual Pursuits Able to track stimulus in all quads without difficulty    Saccades Within functional limits    Visual Fields No apparent deficits      Cognition   Overall Cognitive Status Within Functional Limits for tasks assessed      Sensation   Light Touch Appears Intact      Coordination   Gross Motor Movements are Fluid and Coordinated No    Fine Motor Movements are Fluid and Coordinated No    Coordination and Movement Description decresaed gross motor control in RUE with overhead reaching and lateral movements as needed for self-care tasks and handwriting    Finger Nose Finger Test WNL    9 Hole Peg Test Right;Left    Right 9 Hole Peg Test 31.47    Left 9 Hole Peg Test 28.15    Box and Blocks R: 38 and L: 55      ROM / Strength   AROM / PROM / Strength AROM;Strength      AROM   Overall AROM Comments R shoulder 140* and L shoulder 150*      Strength   Overall Strength Comments grossly 4/5 in BUE      Hand Function   Right Hand Grip (lbs) 50    Right Hand Lateral Pinch 18 lbs    Right Hand 3 Point Pinch 10 lbs    Left Hand Grip (lbs) 55    Left Hand Lateral Pinch 18 lbs    Left 3 point pinch 10 lbs                              OT Education - 12/03/20 1441     Education Details functional [place] tasks to complete at home, handout provided    Person(s) Educated Patient    Methods Explanation    Comprehension Verbalized understanding              OT Short Term Goals - 12/03/20 1622       OT SHORT TERM GOAL #1   Title Pt will be Independent in HEP with focus on [facility] and strengthening as needed for IADLs/leisure pursuits.    Time 4    Period Weeks    Status New    Target Date 12/31/20      OT SHORT TERM GOAL #2   Title Pt will verbalize 3  strategies to increase endurance during home making and/or gardening tasks.    Time 4    Period Weeks    Status New               OT Long Term Goals - 12/03/20 1624       OT LONG  TERM GOAL #1   Title Pt will demonstrate improvements in 9 hole peg test by 3 seconds to increase coordination and needed for handwriting and leisure pursuits.    Baseline L: 28.15 and R: 31.47    Time 8    Period Weeks    Status New    Target Date 01/28/21      OT LONG TERM GOAL #2   Title Pt will improve box and blocks score by 10 on R to demonstrate improved strength and GMC as needed for ADLs and IADLs    Baseline R: 38 and L: 55    Time 8    Period Weeks    Status New      OT LONG TERM GOAL #3   Title Pt will demonstrate increased shoulder flexion to achieve high reach to obtain items from higher surfaces as needed for ADLs/IADLs.    Baseline shoulder flexion R: 140 and L: 150    Time 8    Period Weeks    Status New      OT LONG TERM GOAL #4   Title Pt will report ability to style hair with increased endurance and </= to 2 rest breaks.    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  Pt lives with husband in a condo with 1 step to enter and is a retired Art therapist.  PMH: HTN and HLD. Pt will benefit from skilled occupational therapy services to address strength and coordination, ROM, GM/FM control, safety awareness, activity tolerance/endurance, introduction of compensatory strategies/AE prn, and implementation of an HEP to improve participation and safety during ADLs/IADLs and leisure tasks.    OT Occupational Profile and History Problem Focused Assessment - Including review of records relating to presenting problem    Occupational performance deficits (Please refer to evaluation for details): ADL's;IADL's;Leisure    Body Structure /  Function / Physical Skills ADL;Body mechanics;Coordination;FMC;Endurance;Dexterity;GMC;IADL;Mobility;ROM;Proprioception;Pain;Strength;UE functional use    Rehab Potential Good    Clinical Decision Making Limited treatment options, no task modification necessary    Comorbidities Affecting Occupational Performance: May have comorbidities impacting occupational performance    Modification or Assistance to Complete Evaluation  No modification of tasks or assist necessary to complete eval    OT Frequency 2x / week    OT Duration 8 weeks    OT Treatment/Interventions Self-care/ADL training;Paraffin;Electrical Stimulation;Cryotherapy;Ultrasound;Moist Heat;Fluidtherapy;Therapeutic exercise;Neuromuscular education;Energy conservation;Manual Therapy;Functional Mobility Training;DME and/or AE instruction;Passive range of motion;Patient/family education;Therapeutic activities;Balance training;Psychosocial skills training;Coping strategies training    Plan [facility] to address hair care and handwriting    Consulted and Agree with Plan of Care Patient             Patient will benefit from skilled therapeutic intervention in order  to improve the following deficits and impairments:   Body Structure / Function / Physical Skills: ADL, Body mechanics, Coordination, FMC, Endurance, Dexterity, GMC, IADL, Mobility, ROM, Proprioception, Pain, Strength, UE functional use       Visit Diagnosis: Other lack of coordination  Hemiplegia and hemiparesis following cerebral infarction affecting right dominant side (HCC)  Muscle weakness (generalized)    Problem List Patient Active Problem List   Diagnosis Date Noted   Adjustment disorder with depressed mood 10/28/2020   Acute CVA (cerebrovascular accident) ([place]) - with RUE > RLE weakness and expressive aphasia 10/16/2020   Underweight 10/14/2020   Stress 10/14/2020   Allergic rhinitis 10/01/2020   Lipoma 08/21/2020   Osteoporosis 08/21/2020   Femoral fracture  ([place]) 09/24/2018   Left displaced femoral neck fracture ([place]) 09/23/2018   Pulmonary nodules 04/10/2014   Thyroid nodule 12/12/2013   Hemangioma of liver 12/12/2013    Simonne Come, OT/L 12/03/2020, 4:30 PM  [facility] [address] [place], [place], [address] Phone: [phone]   Fax:  [phone]  Name: Latoya Wright MRN: [id number] Date of Birth: [birth year]

## 2020-12-03 NOTE — Patient Instructions (Signed)
   Speech Exercises  Repeat 2-3 times, 2 times a day    ENUNCIATE (open your mouth!) AND exaggerate the consonant sounds STRONGLY  Call the cat "Buttercup" A calendar of [place], [place] Four floors to cover Yellow oil ointment Fellow lovers of felines Catastrophe in Gould' plums2 The church's chimes chimed Telling time 'til eleven Five valve levers Keep the gate closed Go see that guy Fat cows give milk [employer] Gophers Fat frogs flip freely Kohl's into bed Get that game to [employer] Thick thistles stick together Cinnamon aluminum linoleum Black bugs blood Lovely lemon linament Red leather, yellow leather  Big grocery buggy    Purple baby carriage [facility] Proper copper coffee pot Ripe purple cabbage Three free throws [employer] tackled  [employer] dipped the dessert  [facility] that [employer] of [place] Shirts shrink, shells shouldn't [place] [employer]ers Take the tackle box File the flash message Give me five flapjacks Fundamental relatives Dye the pets purple Talking [place] time after time Dark chocolate chunks Political landscape of the kingdom Estate manager/land agent genius We played yo-yos yesterday   Pt also rec'd conversational sentences

## 2020-12-04 NOTE — Therapy (Signed)
[facility] [address], [place], [place], [address] Phone: [phone]   Fax:  [phone]  Speech Language Pathology Evaluation  Patient Details  Name: Latoya Wright MRN: [id number] Date of Birth: 11/26/1945 Referring Provider (SLP): Latoya Chyle MD   Encounter Date: 12/03/2020   End of Session - 12/04/20 1341     Visit Number 1    Number of Visits 17    Date for SLP Re-Evaluation 02/06/21   extended slightly due to holidays   SLP Start Time 1106    SLP Stop Time  1146    SLP Time Calculation (min) 40 min    Activity Tolerance Patient tolerated treatment well             Past Medical History:  Diagnosis Date   Allergy    Arthritis     Past Surgical History:  Procedure Laterality Date   TOTAL HIP ARTHROPLASTY 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   Procedure: TOTAL HIP ARTHROPLASTY ANTERIOR APPROACH;  Surgeon: Latoya Rossetti, MD;  Location: [facility];  Service: Orthopedics;  Laterality: Left;    There were no vitals filed for this visit.       SLP Evaluation [facility] - 12/04/20 1319       SLP Visit Information   SLP Received On 12/03/20    Referring Provider (SLP) Latoya Chyle MD    Onset Date 10-16-20    Medical Diagnosis CVA      Subjective   Patient/Family Stated Goal Improve speech intelligibility      General Information   HPI Pt with cute CVA 9-22- MRI showed acute infarct in left corona radiata and basal ganglia. Pt had SLE 10-20-20 indicating dysarthria. She had 6-7 sessions HHST (x1/week) reportedly targeting mainly oral motor strengthening.     Balance Screen   Has the patient fallen in the past 6 months Yes    How many times? 1    Has the patient had a decrease in activity level because of a fear of falling?  Yes    Is the patient reluctant to leave their home because of a fear of falling?  No      Prior Functional Status   Cognitive/Linguistic Baseline Within functional limits      Cognition   Overall  Cognitive Status Within Functional Limits for tasks assessed      Auditory Comprehension   Overall Auditory Comprehension Appears within functional limits for tasks assessed      Verbal Expression   Overall Verbal Expression Appears within functional limits for tasks assessed      Oral Motor/Sensory Function   Overall Oral Motor/Sensory Function Impaired    Labial ROM Reduced right   slight   Labial Symmetry Abnormal symmetry right    Labial Strength Reduced Right    Labial Coordination Reduced    Lingual ROM Within Functional Limits    Lingual Symmetry Abnormal symmetry right    Lingual Strength Reduced Right   slight   Lingual Coordination Reduced      Motor Speech   Overall Motor Speech Impaired    Phonation Low vocal intensity   sub low 70s dB   Articulation Impaired    Level of Impairment Phrase    Intelligibility Intelligibility reduced    Conversation --   95%+ with very careful listening by SLP   Effective Techniques Over-articulate  SLP Short Term Goals - 12/04/20 1330       SLP SHORT TERM GOAL #1   Title pt will complete HEP for oral motor strengthening with rare min A over 2 sessions    Time 4    Period Weeks    Status New    Target Date 01/02/21      SLP SHORT TERM GOAL #2   Title pt will have no more than 3 episodes of unintelligible speech in 5 minutes simple conversation in 3 sessions, using speech ocmpensation techniques    Time 4    Period Weeks    Status New    Target Date 01/02/21      SLP SHORT TERM GOAL #3   Title pt will produce sentence responses to verbal or written stimuli with average low 70s dB in 3 sessions    Time 4    Period Weeks    Status New    Target Date 01/02/21              SLP Long Term Goals - 12/04/20 1332       SLP LONG TERM GOAL #1   Title pt will have no more than 2 episodes of unintelligible speech in 10 minutes simple conversation in 3 sessions, using speech  compensation techniques    Time 8    Period Weeks    Target Date 02/06/21   extend a week due to holidays     [place] #2   Title pt will demo oral-motor HEP independently in 3 sessions    Time 8    Period Weeks    Status New    Target Date 02/06/21      SLP LONG TERM GOAL #3   Title pt will indicate less need for lingual sweep during meals than prior to outpatient ST, during last 1-3 ST sessions    Time 8    Period Weeks    Status New    Target Date 02/06/21      SLP LONG TERM GOAL #4   Title pt will improve her PROM score taken from the first 1-2 sessions of outpatient ST    Time 8    Period Weeks    Status New    Target Date 02/06/21      SLP LONG TERM GOAL #5   Title pt will produce 10 minutes simple-mod complex conversation with average low 70s dB in 3 sessions    Time 8    Period Weeks    Status New    Target Date 02/06/21              Plan - 12/04/20 1319     Clinical Impression Statement Pt Latoya Wright "Latoya Wright" presents today with WNL langauge and cognitive ability but with mild-mod ataxic dysarthria, which she states has improved since d/c from the hospital following her 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 She had 6-7 home health ST visits that focused primarily on oral muscular HEP - pt will bring her HEP next session so SLP can assess what is still pertinent and what needs to be modified. Latoya Wright tell SLP today that she notices sometimes when she is speaking too quickly her speech is more "garbled" and then she slows speech rate down however after 60-90 seconds it speeds back up again. Today, pt read 2 paragraphs of rainbow passage and had 3 misarticulations and 5 episodes of unintelligibility. SLP explained compensatory measure of overaticulation ("Open your  mouth when you talk") and she read the remaining paragraph with one misarticulation and nonverbal cues for overarticulation. Pt's speech loudness was consistently sub 70dB. She denies any  s/sx pharyngeal dysphagia, but endorses lt labial sulcus residue. Pt would beneift from skilled ST targeting oral-motor HEP (inlcuding increasing speech volume), and compensatory measures in order to improve her speech intelligbilty.    Speech Therapy Frequency 2x / week    Duration 8 weeks    Treatment/Interventions Internal/external aids;Compensatory strategies;Environmental controls;SLP instruction and feedback;Oral motor exercises;Patient/family education    Potential to Achieve Goals Good    SLP Home Exercise Plan pt to bring next session    Consulted and Agree with Plan of Care Patient             Patient will benefit from skilled therapeutic intervention in order to improve the following deficits and impairments:   Dysarthria and anarthria  Dysphagia, unspecified type    Problem List Patient Active Problem List   Diagnosis Date Noted   Adjustment disorder with depressed mood 10/28/2020   Acute CVA (cerebrovascular accident) [facility]) - with RUE > RLE weakness and expressive aphasia 10/16/2020   Underweight 10/14/2020   Stress 10/14/2020   Allergic rhinitis 10/01/2020   Lipoma 08/21/2020   Osteoporosis 08/21/2020   Femoral fracture ([place]) 09/24/2018   Left displaced femoral neck fracture ([place]) 09/23/2018   Pulmonary nodules 04/10/2014   Thyroid nodule 12/12/2013   Hemangioma of liver 12/12/2013    Sadrac Zeoli ,MS, CCC-SLP  12/04/2020, 1:42 PM  [facility] [address], [place], [place], [address] Phone: [phone]   Fax:  [phone]  Name: Latoya Wright MRN: [id number] Date of Birth: 1945-12-15

## 2020-12-08 ENCOUNTER — Ambulatory Visit: Payer: Medicare PPO

## 2020-12-08 ENCOUNTER — Ambulatory Visit: Payer: Medicare PPO | Admitting: Occupational Therapy

## 2020-12-09 ENCOUNTER — Ambulatory Visit: Payer: Medicare PPO | Admitting: Occupational Therapy

## 2020-12-09 ENCOUNTER — Ambulatory Visit: Payer: Medicare PPO

## 2020-12-09 ENCOUNTER — Other Ambulatory Visit: Payer: Self-pay

## 2020-12-09 DIAGNOSIS — M6281 Muscle weakness (generalized): Secondary | ICD-10-CM

## 2020-12-09 DIAGNOSIS — R471 Dysarthria and anarthria: Secondary | ICD-10-CM

## 2020-12-09 DIAGNOSIS — R278 Other lack of coordination: Secondary | ICD-10-CM

## 2020-12-09 DIAGNOSIS — I69351 Hemiplegia and hemiparesis following cerebral infarction affecting right dominant side: Secondary | ICD-10-CM

## 2020-12-09 DIAGNOSIS — R1310 Dysphagia, unspecified: Secondary | ICD-10-CM

## 2020-12-09 NOTE — Patient Instructions (Addendum)
  Strengthening exercises provided by [facility] - keep doing all of these at least 6 days/week until 02-01-21 Do 10 reps, at least twice a day  Pucker and smile Balloon cheeks Fish face Lip Press (press lips - don't curl) Tongue in cheek Tongue in a circle Mouth open and close ======================================   Practicing your crisp, clear speech with reading  Read out loud for 30 seconds and record using the "voice recorder" app on your phone. Go back and listen to it, underlining any section you feel was particularly slushy or unclear. Then read the same selection again focusing on those areas you've underlined - really enunciate those parts! Repeat this 5 times, twice each day

## 2020-12-09 NOTE — Therapy (Signed)
[facility] [address] [place], [place], [address] Phone: [phone]   Fax:  [phone]  Speech Language Pathology Treatment  Patient Details  Name: Latoya Wright MRN: [id number] Date of Birth: 07/28/1945 Referring Provider (SLP): Dimas Chyle MD   Encounter Date: 12/09/2020   End of Session - 12/09/20 1154     Visit Number 2    Number of Visits 17    Date for SLP Re-Evaluation 02/06/21   extended slightly due to holidays   SLP Start Time 1018    SLP Stop Time  1100    SLP Time Calculation (min) 42 min    Activity Tolerance Patient tolerated treatment well             Past Medical History:  Diagnosis Date   Allergy    Arthritis     Past Surgical History:  Procedure Laterality Date   TOTAL HIP ARTHROPLASTY 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   Procedure: TOTAL HIP ARTHROPLASTY ANTERIOR APPROACH;  Surgeon: Mcarthur Rossetti, MD;  Location: [facility];  Service: Orthopedics;  Laterality: Left;    There were no vitals filed for this visit.   Subjective Assessment - 12/09/20 1123     Subjective Pt states she has practiced the phrases for overarticulation 4-5 days since previous session    Currently in Pain? No/denies                   ADULT SLP TREATMENT - 12/09/20 1015       General Information   Behavior/Cognition Alert;Cooperative;Pleasant mood      Treatment Provided   Treatment provided Cognitive-Linquistic      Cognitive-Linquistic Treatment   Treatment focused on Dysarthria    Skilled Treatment Pt brought her HEP from [facility] and SLP had her review this - req'd min A for accuracy with lip press. SLP told pt to cont this HEP 6 days/week at least and explained that after another 8-10 weeks if pt does not notice measurable difference her difficulties may not be muscle strength based. She acknowledged understanding. SLP had pt review her everyday sentences and her phrases (consonant-laden) with rare min A for reducing  rate. In short paragraph material pt read with rare min A for rate reduction and pt thought she was shouting. SLP used digital recorder to verify pt not shouting. Pt agreed after hearing recording. SLP educated pt on rationale why she would feel like she was shouting - she demonstrated understanding. SLP assisted pt download voice recorder app on her phone and assisted her recording herself reading. After hearing herself pt stated she much slow down and better enunciate - SLP agreed with pt and provided homework for her to read and listen back to reading and correct herself.      Assessment / Recommendations / Plan   Plan Continue with current plan of care      Progression Toward Goals   Progression toward goals Progressing toward goals              SLP Education - 12/09/20 1153     Education Details see daily note    Person(s) Educated Patient    Methods Explanation;Demonstration;Verbal cues;Handout    Comprehension Returned demonstration;Verbalized understanding;Verbal cues required;Need further instruction              SLP Short Term Goals - 12/09/20 1156       SLP SHORT TERM GOAL #1   Title pt will complete HEP for oral motor  strengthening with rare min A over 2 sessions    Baseline 12-09-20    Time 4    Period Weeks    Status On-going    Target Date 01/02/21      SLP SHORT TERM GOAL #2   Title pt will have no more than 3 episodes of unintelligible speech in 5 minutes simple conversation in 3 sessions, using speech ocmpensation techniques    Time 4    Period Weeks    Status On-going    Target Date 01/02/21      SLP SHORT TERM GOAL #3   Title pt will produce sentence responses to verbal or written stimuli with average low 70s dB in 3 sessions    Time 4    Period Weeks    Status On-going    Target Date 01/02/21              SLP Long Term Goals - 12/09/20 1157       SLP LONG TERM GOAL #1   Title pt will have no more than 2 episodes of unintelligible  speech in 10 minutes simple conversation in 3 sessions, using speech compensation techniques    Time 8    Period Weeks    Status On-going      SLP LONG TERM GOAL #2   Title pt will demo oral-motor HEP independently in 3 sessions    Time 8    Period Weeks    Status On-going      SLP LONG TERM GOAL #3   Title pt will indicate less need for lingual sweep during meals than prior to outpatient ST, during last 1-3 ST sessions    Time 8    Period Weeks    Status On-going      SLP LONG TERM GOAL #4   Title pt will improve her PROM score taken from the first 1-2 sessions of outpatient ST    Time 8    Period Weeks    Status On-going      SLP LONG TERM GOAL #5   Title pt will produce 10 minutes simple-mod complex conversation with average low 70s dB in 3 sessions    Time 8    Period Weeks    Status On-going              Plan - 12/09/20 1154     Clinical Impression Statement Pt Latoya Maddalena "[name]" presents today with WNL langauge and cognitive ability but with mild-mod ataxic dysarthria, which she states has improved since d/c from the hospital following her 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 Today SLP educated pt about the feeling she has she is shouting, reason for her emotional lability, and assisted pt with downloading voice recorder app to incr pt's ability to self-monitor reduced speech clarity. See "skilled intervention" for more details. Pt would beneift from skilled ST targeting oral-motor HEP (inlcuding increasing speech volume), and compensatory measures in order to improve her speech intelligbilty.    Speech Therapy Frequency 2x / week    Duration 8 weeks    Treatment/Interventions Internal/external aids;Compensatory strategies;Environmental controls;SLP instruction and feedback;Oral motor exercises;Patient/family education    Potential to Achieve Goals Good    SLP Home Exercise Plan pt to bring next session    Consulted and Agree with Plan of Care Patient              Patient will benefit from skilled therapeutic intervention in order to improve the following deficits and  impairments:   Dysarthria and anarthria  Dysphagia, unspecified type    Problem List Patient Active Problem List   Diagnosis Date Noted   Adjustment disorder with depressed mood 10/28/2020   Acute CVA (cerebrovascular accident) ([place]) - with RUE > RLE weakness and expressive aphasia 10/16/2020   Underweight 10/14/2020   Stress 10/14/2020   Allergic rhinitis 10/01/2020   Lipoma 08/21/2020   Osteoporosis 08/21/2020   Femoral fracture ([place]) 09/24/2018   Left displaced femoral neck fracture ([place]) 09/23/2018   Pulmonary nodules 04/10/2014   Thyroid nodule 12/12/2013   Hemangioma of liver 12/12/2013    Johnnetta Holstine ,MS, CCC-SLP  12/09/2020, 11:58 AM  [facility]. [address] [place], [place], [address] Phone: [phone]   Fax:  [phone]   Name: Latoya Wright MRN: [id number] Date of Birth: September 30, 1945

## 2020-12-09 NOTE — Patient Instructions (Signed)
  Coordination Activities  Perform the following activities for 15 minutes 1-2 times per day with right hand(s).  Rotate ball in fingertips (clockwise and counter-clockwise). Toss ball between hands. Toss ball in air and catch with the same hand. Flip cards 1 at a time as fast as you can. Pick up coins, buttons, marbles, dried beans/pasta of different sizes and place in container. Pick up coins and stack. Pick up coins one at a time until you get 5-10 in your hand, then move coins from palm to fingertips to stack one at a time. Twirl pen between fingers. Practice writing, dot to dot, mazes, word search puzzles

## 2020-12-09 NOTE — Therapy (Signed)
[facility] [address] [place], [place], [address] Phone: [phone]   Fax:  [phone]  Occupational Therapy Treatment  Patient Details  Name: Latoya Wright MRN: [id number] Date of Birth: Dec 05, 1945 Referring Provider (OT): Vivi Barrack. MD   Encounter Date: 12/09/2020   OT End of Session - 12/09/20 1101     Visit Number 2    Number of Visits 17    Date for OT Re-Evaluation 01/28/21    Authorization Type [employer]    OT Start Time 1102    OT Stop Time 1145    OT Time Calculation (min) 43 min    Activity Tolerance Patient tolerated treatment well    Behavior During Therapy WFL for tasks assessed/performed             Past Medical History:  Diagnosis Date   Allergy    Arthritis     Past Surgical History:  Procedure Laterality Date   TOTAL HIP ARTHROPLASTY 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   Procedure: TOTAL HIP ARTHROPLASTY ANTERIOR APPROACH;  Surgeon: Mcarthur Rossetti, MD;  Location: [facility];  Service: Orthopedics;  Laterality: Left;    There were no vitals filed for this visit.   Subjective Assessment - 12/09/20 1050     Subjective  Pt reports continued difficulty with handwriting, reporting that it is difficult "no matter what I do" with difficulty staying on the line and the letters getting smaller.    Pertinent History HTN and HLD    Patient Stated Goals I want to write                  [facility] activities with focus on increased awareness of movement as pt frequently fading off during task.  Pt starting with big movements, but then with difficulty completing movement as fatigue sets in.  Engaged in flipping cards with focus in coordination with cards not flipping fully over due to decreased wrist and finger movement as task continued.  Therapist encouraging pt to fully attend to task to complete full movement and focus on "quality of movement".  Engaged in peg board pattern replication with focus on in-hand  manipulation and coordination of small pegs.  Pt reports difficulty with managing of peg in hand with decreased coordination.    Therapist provided pt with HEP with focus on [facility].  Engaged in each exercise with min cues for technique to promote carryover for continued use at home.                  OT Short Term Goals - 12/09/20 1059       OT SHORT TERM GOAL #1   Title Pt will be Independent in HEP with focus on [facility] and strengthening as needed for IADLs/leisure pursuits.    Time 4    Period Weeks    Status On-going    Target Date 12/31/20      OT SHORT TERM GOAL #2   Title Pt will verbalize 3 strategies to increase endurance during home making and/or gardening tasks.    Time 4    Period Weeks    Status On-going               OT Long Term Goals - 12/09/20 1059       OT LONG TERM GOAL #1   Title Pt will demonstrate improvements in 9 hole peg test by 3 seconds to increase coordination and needed for handwriting and leisure pursuits.  Baseline L: 28.15 and R: 31.47    Time 8    Period Weeks    Status On-going      OT LONG TERM GOAL #2   Title Pt will improve box and blocks score by 10 on R to demonstrate improved strength and GMC as needed for ADLs and IADLs    Baseline R: 38 and L: 55    Time 8    Period Weeks    Status On-going      OT LONG TERM GOAL #3   Title Pt will demonstrate increased shoulder flexion to achieve high reach to obtain items from higher surfaces as needed for ADLs/IADLs.    Baseline shoulder flexion R: 140 and L: 150    Time 8    Period Weeks    Status On-going      OT LONG TERM GOAL #4   Title Pt will report ability to style hair with increased endurance and </= to 2 rest breaks.    Time 8    Period Weeks    Status On-going                   Plan - 12/09/20 1100     Clinical Impression Statement Pt is continuing to voice complaints about the legibility of her handwriting.  Pt demonstrates difficulty with [facility] tasks  with manipulation and controlled movement with fingertips. Pt will benefit from skilled occupational therapy services to address strength and coordination, ROM, GM/FM control, safety awareness, activity tolerance/endurance, introduction of compensatory strategies/AE prn, and implementation of an HEP to improve participation and safety during ADLs/IADLs and leisure tasks.    OT Occupational Profile and History Problem Focused Assessment - Including review of records relating to presenting problem    Occupational performance deficits (Please refer to evaluation for details): ADL's;IADL's;Leisure    Body Structure / Function / Physical Skills ADL;Body mechanics;Coordination;FMC;Endurance;Dexterity;GMC;IADL;Mobility;ROM;Proprioception;Pain;Strength;UE functional use    Rehab Potential Good    Clinical Decision Making Limited treatment options, no task modification necessary    Comorbidities Affecting Occupational Performance: May have comorbidities impacting occupational performance    Modification or Assistance to Complete Evaluation  No modification of tasks or assist necessary to complete eval    OT Frequency 2x / week    OT Duration 8 weeks    OT Treatment/Interventions Self-care/ADL training;Paraffin;Electrical Stimulation;Cryotherapy;Ultrasound;Moist Heat;Fluidtherapy;Therapeutic exercise;Neuromuscular education;Energy conservation;Manual Therapy;Functional Mobility Training;DME and/or AE instruction;Passive range of motion;Patient/family education;Therapeutic activities;Balance training;Psychosocial skills training;Coping strategies training    Plan [facility] to address hair care and handwriting    OT Home Exercise Plan [place] and Agree with Plan of Care Patient             Patient will benefit from skilled therapeutic intervention in order to improve the following deficits and impairments:   Body Structure / Function / Physical Skills: ADL, Body mechanics, Coordination, FMC,  Endurance, Dexterity, GMC, IADL, Mobility, ROM, Proprioception, Pain, Strength, UE functional use       Visit Diagnosis: Other lack of coordination  Hemiplegia and hemiparesis following cerebral infarction affecting right dominant side (HCC)  Muscle weakness (generalized)    Problem List Patient Active Problem List   Diagnosis Date Noted   Adjustment disorder with depressed mood 10/28/2020   Acute CVA (cerebrovascular accident) ([place]) - with RUE > RLE weakness and expressive aphasia 10/16/2020   Underweight 10/14/2020   Stress 10/14/2020   Allergic rhinitis 10/01/2020   Lipoma 08/21/2020   Osteoporosis 08/21/2020   Femoral fracture (  [place]) 09/24/2018   Left displaced femoral neck fracture ([place]) 09/23/2018   Pulmonary nodules 04/10/2014   Thyroid nodule 12/12/2013   Hemangioma of liver 12/12/2013    Simonne Come, OT/L 12/10/2020, 11:14 AM  [facility]. [address], [place], [place], [address] Phone: [phone]   Fax:  [phone]  Name: VALOREE AGENT MRN: [id number] Date of Birth: [birth year]

## 2020-12-10 ENCOUNTER — Encounter: Payer: Self-pay | Admitting: Adult Health

## 2020-12-10 ENCOUNTER — Ambulatory Visit: Payer: Medicare PPO | Admitting: Adult Health

## 2020-12-10 ENCOUNTER — Telehealth: Payer: Self-pay

## 2020-12-10 ENCOUNTER — Encounter: Payer: Self-pay | Admitting: Occupational Therapy

## 2020-12-10 VITALS — BP 152/70 | HR 71 | Ht 68.0 in | Wt 105.0 lb

## 2020-12-10 DIAGNOSIS — R29898 Other symptoms and signs involving the musculoskeletal system: Secondary | ICD-10-CM | POA: Diagnosis not present

## 2020-12-10 DIAGNOSIS — E785 Hyperlipidemia, unspecified: Secondary | ICD-10-CM

## 2020-12-10 DIAGNOSIS — I639 Cerebral infarction, unspecified: Secondary | ICD-10-CM | POA: Diagnosis not present

## 2020-12-10 DIAGNOSIS — I69322 Dysarthria following cerebral infarction: Secondary | ICD-10-CM

## 2020-12-10 NOTE — Telephone Encounter (Signed)
Pt called stating that she saw Neuro today. She stated that she needs her cholesterol checked. PT wants to know if orders can be placed. Please Advise.

## 2020-12-10 NOTE — Progress Notes (Signed)
[facility] [address] [place]. [place] [address] [phone]       HOSPITAL FOLLOW UP NOTE  [name] Date of Birth:  12/19/1945 Medical Record Number:  [id number]   Reason for Referral:  hospital stroke follow up    SUBJECTIVE:   CHIEF COMPLAINT:  Chief Complaint  Patient presents with   Follow-up    RM 2 with spouse Latoya Wright  Pt is well and stable, doing fine no concerns     HPI:   Latoya Wright is a 75 year old female with no significant past medical history who presented on 10/16/2020 with right-sided weakness and aphasia.  Initially, symptoms improved at the time of presentation to ED but unfortunately while awaiting further evaluation, she began to have recurrent symptoms. Personally reviewed hospitalization pertinent progress notes, lab work and imaging.  Evaluated by Dr. Erlinda Hong.  CT no acute abnormality.  MRI showed left CR and BG infarct.  MRA negative, carotid Doppler negative, EF 60 to 65%.  LDL 121, A1c 5.3, creatinine 0.84.  COVID PCR test positive.  Etiology for stroke felt to be due to small vessel disease.  Recommended aspirin Plavix for 3 weeks and aspirin alone and initiate atorvastatin 40 mg daily.  Other stroke risk factors include advanced age -no prior stroke history.  Therapy eval initially recommended CIR however in setting of significant improvement, rec's chenaged to [facility] therapies.   Today, 12/10/2020, Latoya Wright is being seen for initial hospital follow-up accompanied by her husband, Latoya Wright.   Stable since discharge without new or worsening stroke/TIA symptoms. OT - right hand weakness more so affecting writing SLP -mild dysarthria, occasional word finding difficulty/hesitancy if trying to speak too quickly Denies residual RLE weakness or any gait difficulty Does report continued gradual improvement she has essentially returned back to all prior activities.  Able to maintain ADLs and majority of IADLs independently  Completed 3  weeks DAPT -remains on aspirin alone as well as atorvastatin without side effects.   Blood pressure today 152/70.  Not routinely monitoring at home - was initally monitored while working with [facility] therapies and was stable during that time -typically elevated at appointments  No questions or concerns at this time     Latoya Wright  Code Stroke  CT head No acute abnormality.  Small vessel disease. Atrophy.  ASPECTS 10.     MRI  BRAIN Acute infarct left corona radiata and basal ganglia.  No hemorrhage. Mild chronic microvascular ischemic changes. MRA HEAD No proximal intracranial vessel occlusion or significant stenosis. Carotid Doppler    Right Carotid: Velocities in the right ICA are consistent with a 1-39%  stenosis.  Left Carotid: Velocities in the left ICA are consistent with a 1-39%  stenosis.  Vertebrals: Bilateral vertebral arteries demonstrate antegrade flow.  2D Echo EF 60 to 65% LDL 121 HgbA1c 5.3    ROS:   14 system review of systems performed and negative with exception of those listed in HPI  PMH:  Past Medical History:  Diagnosis Date   Allergy    Arthritis     PSH:  Past Surgical History:  Procedure Laterality Date   TOTAL HIP ARTHROPLASTY 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   Procedure: TOTAL HIP ARTHROPLASTY ANTERIOR APPROACH;  Surgeon: Mcarthur Rossetti, MD;  Location: [facility];  Service: Orthopedics;  Laterality: Left;    Social History:  Social History   Socioeconomic History   Marital status: Married    Spouse name: Not on file   Number of children:  Not on file   Years of education: Not on file   Highest education level: Not on file  Occupational History   Not on file  Tobacco Use   Smoking status: Never   Smokeless tobacco: Never  Substance and Sexual Activity   Alcohol use: No   Drug use: No   Sexual activity: Not on file  Other Topics Concern   Not on file  Social History Narrative   Not on file   Social Determinants of Health   Financial  Resource Strain: Not on file  Food Insecurity: Not on file  Transportation Needs: Not on file  Physical Activity: Not on file  Stress: Not on file  Social Connections: Not on file  Intimate Partner Violence: Not on file    Family History:  Family History  Problem Relation Age of Onset   Heart disease Father    Heart disease Maternal Grandmother    Heart disease Maternal Grandfather    Heart disease Paternal Grandmother    Heart disease Paternal Grandfather    Rheum arthritis Sister    Rheum arthritis Brother     Medications:   Current Outpatient Medications on File Prior to Visit  Medication Sig Dispense Refill   aspirin EC 81 MG EC tablet Take 1 tablet (81 mg total) by mouth daily. Swallow whole. 30 tablet 11   atorvastatin (LIPITOR) 40 MG tablet Take 1 tablet (40 mg total) by mouth at bedtime. 90 tablet 1   Biotin 5 MG TABS Take 5 mg by mouth daily.     calcium-vitamin D (OSCAL 500/200 D-3) 500-200 MG-UNIT tablet Take 1 tablet by mouth 2 (two) times daily. 180 tablet 3   No current facility-administered medications on file prior to visit.    Allergies:   Allergies  Allergen Reactions   Dayquil [Pseudoephedrine-Apap-Dm]     jittery      OBJECTIVE:  Physical Exam  Vitals:   12/10/20 1408  BP: (!) 152/70  Pulse: 71  Weight: 105 lb (47.6 kg)  Height: 5\' 8"  (1.727 m)   Body mass index is 15.97 kg/m. No results found.  Post stroke PHQ 2/9 Depression screen PHQ 2/9 12/10/2020  Decreased Interest 0  Down, Depressed, Hopeless 0  PHQ - 2 Score 0    General: Frail very pleasant elderly Caucasian female, seated, in no evident distress Head: head normocephalic and atraumatic.   Neck: supple with no carotid or supraclavicular bruits Cardiovascular: regular rate and rhythm, no murmurs Musculoskeletal: no deformity Skin:  no rash/petichiae Vascular:  Normal pulses all extremities   Neurologic Exam Mental Status: Awake and fully alert. mild dysarthria and  intermittent hypophonia and speech hesitancy.  Follows commands without difficulty.  Oriented to place and time. Recent and remote memory intact. Attention span, concentration and fund of knowledge appropriate. Mood and affect appropriate.  Cranial Nerves: Fundoscopic exam reveals sharp disc margins. Pupils equal, briskly reactive to light. Extraocular movements full without nystagmus. Visual fields full to confrontation. Hearing intact. Facial sensation intact.  Mild right facial asymmetry when smiling.  Tongue and palate moves normally and symmetrically.  Motor: Normal bulk and tone. Normal strength in all tested extremity muscles except slight decrease right hand dexterity Sensory.: intact to touch , pinprick , position and vibratory sensation.  Coordination: Rapid alternating movements normal in all extremities except slight decreased right hand dexterity. Finger-to-nose and heel-to-shin performed accurately bilaterally.  Orbits left arm over right arm Gait and Station: Arises from chair without difficulty. Stance is normal. Gait  demonstrates normal stride length and balance without use of AD. Tandem walk and heel toe without difficulty.  Reflexes: 1+ and symmetric. Toes downgoing.     NIHSS  2 Modified Rankin  2      ASSESSMENT: Latoya Wright is a 75 y.o. year old female with a left CR and BG infarcts on 10/16/2020 likely secondary to small vessel disease source. Vascular risk factors include HLD and advanced age.      PLAN:  L CR and BG stroke :  Residual deficit: mild decreased right hand dexterity and fine motor control and mild dysarthria with hypophonia.  Continue participation with outpatient therapies ([facility]) for likely ongoing recovery Continue aspirin 81 mg daily  and atorvastatin 40 mg daily for secondary stroke prevention.   Discussed secondary stroke prevention measures and importance of close PCP follow up for aggressive stroke risk factor management. I have  gone over the pathophysiology of stroke, warning signs and symptoms, risk factors and their management in some detail with instructions to go to the closest emergency room for symptoms of concern. HLD: LDL goal <70. Recent LDL 121.  Continue atorvastatin 40 mg daily. Offered repeat lipid panel today - she wishes to reach out to her PCP to have this completed Slightly elevated BP: no HTN hx. Not currently on pharmacological management. Continued monitoring by PCP    Follow up in 6 months or call earlier if needed   CC:  [facility] provider: Dr. Leonie Man PCP: Vivi Barrack, MD    I spent 54 minutes of face-to-face and non-face-to-face time with patient and husband.  This included previsit chart review including review of recent hospitalization, lab review, study review, electronic health record documentation, patient and husband education regarding recent stroke including etiology, secondary stroke prevention measures and importance of managing stroke risk factors, residual deficits and typical recovery time and answered all other questions to patient and husband's satisfaction  Latoya Wright, AGNP-BC  [facility] [address] [place], [place] [address]  Phone [phone] Fax [phone] Note: This document was prepared with digital dictation and possible smart phrase technology. Any transcriptional errors that result from this process are unintentional.

## 2020-12-10 NOTE — Patient Instructions (Signed)
Continue aspirin 81 mg daily  and atorvastatin  for secondary stroke prevention  Continue to follow up with PCP regarding cholesterol and blood pressure management  Maintain strict control of hypertension with blood pressure goal below 130/90 and cholesterol with LDL cholesterol (bad cholesterol) goal below 70 mg/dL.        Followup in the future with me in 6 months or call earlier if needed     Thank you for coming to see us at [facility]. I hope we have been able to provide you high quality care today.  You may receive a patient satisfaction survey over the next few weeks. We would appreciate your feedback and comments so that we may continue to improve ourselves and the health of our patients.  

## 2020-12-11 NOTE — Telephone Encounter (Signed)
Pt called back to check the status of the previous message.

## 2020-12-11 NOTE — Telephone Encounter (Signed)
Left message to return call to our office at their convenience.   Last Lipid was done on 10/17/2020. Do we need to faxed results to Neuro?

## 2020-12-11 NOTE — Progress Notes (Signed)
I agree with the above plan 

## 2020-12-11 NOTE — Telephone Encounter (Signed)
Spoke with patient, patient aware lab done 1 month ago  Will ask neuro if ok  Will let us know if another lab need to be placed

## 2020-12-12 ENCOUNTER — Telehealth: Payer: Self-pay | Admitting: Adult Health

## 2020-12-12 NOTE — Telephone Encounter (Signed)
Pt has asked to be called Latoya Wright cholesterol test coming back as 121 LDL.  Pt states if another test is needed it will have to be approved by her insurance.

## 2020-12-15 ENCOUNTER — Other Ambulatory Visit: Payer: Self-pay

## 2020-12-15 ENCOUNTER — Ambulatory Visit: Payer: Medicare PPO

## 2020-12-15 ENCOUNTER — Encounter: Payer: Self-pay | Admitting: Occupational Therapy

## 2020-12-15 ENCOUNTER — Ambulatory Visit: Payer: Medicare PPO | Admitting: Occupational Therapy

## 2020-12-15 DIAGNOSIS — I69351 Hemiplegia and hemiparesis following cerebral infarction affecting right dominant side: Secondary | ICD-10-CM

## 2020-12-15 DIAGNOSIS — R471 Dysarthria and anarthria: Secondary | ICD-10-CM | POA: Diagnosis not present

## 2020-12-15 DIAGNOSIS — M6281 Muscle weakness (generalized): Secondary | ICD-10-CM

## 2020-12-15 DIAGNOSIS — R278 Other lack of coordination: Secondary | ICD-10-CM

## 2020-12-15 DIAGNOSIS — R1310 Dysphagia, unspecified: Secondary | ICD-10-CM

## 2020-12-15 NOTE — Telephone Encounter (Signed)
Called patient and LVM to advise she discuss lipid panel further with PCP. Due to previous elevated lipids and her recent stroke it should be approved. Left # for questions.

## 2020-12-15 NOTE — Therapy (Signed)
[facility] [address] [place], [place], [address] Phone: [phone]   Fax:  [phone]  Speech Language Pathology Treatment  Patient Details  Name: Latoya Wright MRN: [id number] Date of Birth: 06/03/1945 Referring Provider (SLP): Dimas Chyle MD   Encounter Date: 12/15/2020   End of Session - 12/15/20 1312     Visit Number 3    Number of Visits 17    Date for SLP Re-Evaluation 02/06/21   extended slightly due to holidays   SLP Start Time 1103    SLP Stop Time  1145    SLP Time Calculation (min) 42 min    Activity Tolerance Patient tolerated treatment well             Past Medical History:  Diagnosis Date   Allergy    Arthritis     Past Surgical History:  Procedure Laterality Date   TOTAL HIP ARTHROPLASTY 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   Procedure: TOTAL HIP ARTHROPLASTY ANTERIOR APPROACH;  Surgeon: Mcarthur Rossetti, MD;  Location: [facility];  Service: Orthopedics;  Laterality: Left;    There were no vitals filed for this visit.   Subjective Assessment - 12/15/20 1109     Subjective "Hearing it did help"    Currently in Pain? No/denies                   ADULT SLP TREATMENT - 12/15/20 1109       General Information   Behavior/Cognition Alert;Cooperative;Pleasant mood      Treatment Provided   Treatment provided Cognitive-Linquistic      Cognitive-Linquistic Treatment   Treatment focused on Dysarthria    Skilled Treatment Pt brought her recordings of her reading twice over the weekend. Pt with faster rate than practical for overarticulation. SLP pointed this out to pt and she stated she was noticing the words ahead of where she was reading and this propelled her forward. SLP worked with pt today in 1-2 sentence responses to encourage overartiuclation. Pt had much more success with this scenario than with reading. Approx 75%-80% success with overarticuation.      Assessment / Recommendations / Plan   Plan  Continue with current plan of care      Progression Toward Goals   Progression toward goals Progressing toward goals              SLP Education - 12/15/20 1311     Education Details pt needs to slow down and overarticulate in order to improve intelligibility    Person(s) Educated Patient    Methods Explanation;Demonstration    Comprehension Verbalized understanding;Returned demonstration;Verbal cues required;Need further instruction              SLP Short Term Goals - 12/15/20 1313       SLP SHORT TERM GOAL #1   Title pt will complete HEP for oral motor strengthening with rare min A over 2 sessions    Baseline 12-09-20    Time 4    Period Weeks    Status On-going    Target Date 01/02/21      SLP SHORT TERM GOAL #2   Title pt will have no more than 3 episodes of unintelligible speech in 5 minutes simple conversation in 3 sessions, using speech ocmpensation techniques    Time 4    Period Weeks    Status On-going    Target Date 01/02/21      SLP SHORT TERM GOAL #3  Title pt will produce sentence responses to verbal or written stimuli with average low 70s dB in 3 sessions    Time 4    Period Weeks    Status On-going    Target Date 01/02/21              SLP Long Term Goals - 12/15/20 1313       SLP LONG TERM GOAL #1   Title pt will have no more than 2 episodes of unintelligible speech in 10 minutes simple conversation in 3 sessions, using speech compensation techniques    Time 8    Period Weeks    Status On-going    Target Date 02/06/21      SLP LONG TERM GOAL #2   Title pt will demo oral-motor HEP independently in 3 sessions    Time 8    Period Weeks    Status On-going    Target Date 02/06/21      SLP LONG TERM GOAL #3   Title pt will indicate less need for lingual sweep during meals than prior to outpatient ST, during last 1-3 ST sessions    Time 8    Period Weeks    Status On-going    Target Date 02/06/21      SLP LONG TERM GOAL #4    Title pt will improve her PROM score taken from the first 1-2 sessions of outpatient ST    Time 8    Period Weeks    Status On-going    Target Date 02/06/21      SLP LONG TERM GOAL #5   Title pt will produce 10 minutes simple-mod complex conversation with average low 70s dB in 3 sessions    Time 8    Period Weeks    Status On-going    Target Date 02/06/21              Plan - 12/15/20 1312     Clinical Impression Statement Pt [name] "[name]" presents today with WNL langauge and cognitive ability but with mild-mod ataxic dysarthria, which she states has improved since d/c from the hospital following her 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 Pt benefitted from use of her recorder app to judge her own speech intelligibility. See "skilled intervention" for more details. Pt would beneift from skilled ST targeting oral-motor HEP (inlcuding increasing speech volume), and compensatory measures in order to improve her speech intelligbilty.    Speech Therapy Frequency 2x / week    Duration 8 weeks    Treatment/Interventions Internal/external aids;Compensatory strategies;Environmental controls;SLP instruction and feedback;Oral motor exercises;Patient/family education    Potential to Achieve Goals Good    SLP Home Exercise Plan pt to bring next session    Consulted and Agree with Plan of Care Patient             Patient will benefit from skilled therapeutic intervention in order to improve the following deficits and impairments:   Dysarthria and anarthria  Dysphagia, unspecified type    Problem List Patient Active Problem List   Diagnosis Date Noted   Adjustment disorder with depressed mood 10/28/2020   Acute CVA (cerebrovascular accident) [facility]) - with RUE > RLE weakness and expressive aphasia 10/16/2020   Underweight 10/14/2020   Stress 10/14/2020   Allergic rhinitis 10/01/2020   Lipoma 08/21/2020   Osteoporosis 08/21/2020   Femoral fracture ([place]) 09/24/2018    Left displaced femoral neck fracture ([place]) 09/23/2018   Pulmonary nodules 04/10/2014   Thyroid  nodule 12/12/2013   Hemangioma of liver 12/12/2013    Jessamy Torosyan, CCC-SLP 12/15/2020, 1:14 PM  [facility] [address], [place], [place], [address] Phone: [phone]   Fax:  [phone]   Name: BEKAH IGOE MRN: [id number] Date of Birth: Jan 17, 1946

## 2020-12-15 NOTE — Telephone Encounter (Signed)
LVM informing patient will let NP know that repeat lipid panel will require insurance approval.Will call her back after I get NP's reply.

## 2020-12-15 NOTE — Telephone Encounter (Signed)
At recent visit, she wished to have this completed by her PCP - as her levels were elevated and started on cholesterol lowering medication, there should be no problem with rechecking cholesterol levels especially in setting of recent stroke. As she wished to have this completed by PCP, I would advise her to further discuss with her PCP

## 2020-12-15 NOTE — Therapy (Signed)
[facility] [address], [place], [place], [address] Phone: [phone]   Fax:  [phone]  Occupational Therapy Treatment  Patient Details  Name: Latoya Wright MRN: [id number] Date of Birth: 1945-10-05 Referring Provider (OT): Vivi Barrack. MD   Encounter Date: 12/15/2020   OT End of Session - 12/15/20 1305     Visit Number 3    Number of Visits 17    Date for OT Re-Evaluation 01/28/21    Authorization Type [employer]    OT Start Time 1148    OT Stop Time 1230    OT Time Calculation (min) 42 min    Activity Tolerance Patient tolerated treatment well    Behavior During Therapy WFL for tasks assessed/performed             Past Medical History:  Diagnosis Date   Allergy    Arthritis     Past Surgical History:  Procedure Laterality Date   TOTAL HIP ARTHROPLASTY 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   Procedure: TOTAL HIP ARTHROPLASTY ANTERIOR APPROACH;  Surgeon: Mcarthur Rossetti, MD;  Location: [facility];  Service: Orthopedics;  Laterality: Left;    There were no vitals filed for this visit.   Subjective Assessment - 12/15/20 1150     Subjective  Pt reports completing HEP exercises since last visit. Pt reports her daughter got her a work book for tracing letters that she is using, but gets easily frustrated.    Pertinent History HTN and HLD    Patient Stated Goals I want to write    Currently in Pain? No/denies               Grip strengthening with blue (light resistance) digiflex with focus on full grasp strength 2 sets of 10 then individual dexterity with increased difficulty with 4th and 5th digits. Discussed rationale of increased difficulty with 4th and 5th digits but also importance of whole hand movements with ADLs and IADLs.  Theraputty exercises with focus on pinch strength and coordination as needed for manipulation of items to complete ADLs and IADLs and handwriting.  Focus placed on various pinches and  finger flexion/extension as well as coordination of thumb and first 2 fingers to make small ball in fingers only.  Provided with HEP for theraputty.  [facility] tasks with Card sorting and flipping cards with R hand with focus on coordination and motor control.  Handwriting activity with writing 10 item grocery list.  Small print with decreasing in size and legibility as task continued..  Pt reports "It looks horrible".  Encouraged pt to attempt to write larger to increase legibility.  Also encouraged pt to take rest breaks during writing tasks due to onset of fatigue which may be impacting size and legibility.                           OT Short Term Goals - 12/09/20 1059       OT SHORT TERM GOAL #1   Title Pt will be Independent in HEP with focus on [facility] and strengthening as needed for IADLs/leisure pursuits.    Time 4    Period Weeks    Status On-going    Target Date 12/31/20      OT SHORT TERM GOAL #2   Title Pt will verbalize 3 strategies to increase endurance during home making and/or gardening tasks.    Time 4    Period Weeks  Status On-going               OT Long Term Goals - 12/09/20 1059       OT LONG TERM GOAL #1   Title Pt will demonstrate improvements in 9 hole peg test by 3 seconds to increase coordination and needed for handwriting and leisure pursuits.    Baseline L: 28.15 and R: 31.47    Time 8    Period Weeks    Status On-going      OT LONG TERM GOAL #2   Title Pt will improve box and blocks score by 10 on R to demonstrate improved strength and GMC as needed for ADLs and IADLs    Baseline R: 38 and L: 55    Time 8    Period Weeks    Status On-going      OT LONG TERM GOAL #3   Title Pt will demonstrate increased shoulder flexion to achieve high reach to obtain items from higher surfaces as needed for ADLs/IADLs.    Baseline shoulder flexion R: 140 and L: 150    Time 8    Period Weeks    Status On-going      OT LONG TERM GOAL #4    Title Pt will report ability to style hair with increased endurance and </= to 2 rest breaks.    Time 8    Period Weeks    Status On-going                   Plan - 12/15/20 1306     Clinical Impression Statement Pt is continuing to voice and demonstrate decreased legibility and small font with increasingly smaller as she fatigues. Pt reports using her [facility] HEP and personal tracing workbooks at home to continue to improve her handwriting. Pt reports fatigue in hand with functional tasks, most spefically her handwriting and occasionally with haircare and opening contiainers but reports that is improving.  Pt is receptive to education and demonstrating carryover from previous sessions and will continue to benefit from skilled OT to address impairments impacting her ability to complete ADLs, IADLs, and leisure tasks.    OT Occupational Profile and History Problem Focused Assessment - Including review of records relating to presenting problem    Occupational performance deficits (Please refer to evaluation for details): ADL's;IADL's;Leisure    Body Structure / Function / Physical Skills ADL;Body mechanics;Coordination;FMC;Endurance;Dexterity;GMC;IADL;Mobility;ROM;Proprioception;Pain;Strength;UE functional use    Rehab Potential Good    Clinical Decision Making Limited treatment options, no task modification necessary    Comorbidities Affecting Occupational Performance: May have comorbidities impacting occupational performance    Modification or Assistance to Complete Evaluation  No modification of tasks or assist necessary to complete eval    OT Frequency 2x / week    OT Duration 8 weeks    OT Treatment/Interventions Self-care/ADL training;Paraffin;Electrical Stimulation;Cryotherapy;Ultrasound;Moist Heat;Fluidtherapy;Therapeutic exercise;Neuromuscular education;Energy conservation;Manual Therapy;Functional Mobility Training;DME and/or AE instruction;Passive range of motion;Patient/family  education;Therapeutic activities;Balance training;Psychosocial skills training;Coping strategies training    Plan [facility] for handwriting and overhead reaching as needed for haircare and gardening.    OT Home Exercise Plan theraputty HEP    Consulted and Agree with Plan of Care Patient             Patient will benefit from skilled therapeutic intervention in order to improve the following deficits and impairments:   Body Structure / Function / Physical Skills: ADL, Body mechanics, Coordination, FMC, Endurance, Dexterity, GMC, IADL, Mobility, ROM, Proprioception, Pain, Strength, UE  functional use       Visit Diagnosis: Other lack of coordination  Hemiplegia and hemiparesis following cerebral infarction affecting right dominant side (HCC)  Muscle weakness (generalized)    Problem List Patient Active Problem List   Diagnosis Date Noted   Adjustment disorder with depressed mood 10/28/2020   Acute CVA (cerebrovascular accident) ([place]) - with RUE > RLE weakness and expressive aphasia 10/16/2020   Underweight 10/14/2020   Stress 10/14/2020   Allergic rhinitis 10/01/2020   Lipoma 08/21/2020   Osteoporosis 08/21/2020   Femoral fracture ([place]) 09/24/2018   Left displaced femoral neck fracture ([place]) 09/23/2018   Pulmonary nodules 04/10/2014   Thyroid nodule 12/12/2013   Hemangioma of liver 12/12/2013    Simonne Come, OT/L 12/15/2020, 1:14 PM  [facility] [address] [place], [place], [address] Phone: [phone]   Fax:  [phone]  Name: RAGNA KRAMLICH MRN: [id number] Date of Birth: October 26, 1945

## 2020-12-16 ENCOUNTER — Encounter: Payer: Medicare PPO | Admitting: Occupational Therapy

## 2020-12-23 ENCOUNTER — Encounter: Payer: Self-pay | Admitting: Occupational Therapy

## 2020-12-23 ENCOUNTER — Ambulatory Visit: Payer: Medicare PPO | Admitting: Occupational Therapy

## 2020-12-23 ENCOUNTER — Ambulatory Visit: Payer: Medicare PPO

## 2020-12-23 ENCOUNTER — Other Ambulatory Visit: Payer: Self-pay

## 2020-12-23 DIAGNOSIS — R278 Other lack of coordination: Secondary | ICD-10-CM

## 2020-12-23 DIAGNOSIS — R471 Dysarthria and anarthria: Secondary | ICD-10-CM

## 2020-12-23 DIAGNOSIS — M6281 Muscle weakness (generalized): Secondary | ICD-10-CM

## 2020-12-23 DIAGNOSIS — I69351 Hemiplegia and hemiparesis following cerebral infarction affecting right dominant side: Secondary | ICD-10-CM

## 2020-12-23 DIAGNOSIS — R1310 Dysphagia, unspecified: Secondary | ICD-10-CM

## 2020-12-23 NOTE — Therapy (Signed)
[facility] [address], [place], [place], [address] Phone: [phone]   Fax:  [phone]  Occupational Therapy Treatment  Patient Details  Name: Latoya Wright MRN: [id number] Date of Birth: [birth year] Referring Provider (OT): Vivi Barrack. MD   Encounter Date: 12/23/2020   OT End of Session - 12/23/20 1230     Visit Number 4    Number of Visits 17    Date for OT Re-Evaluation 01/28/21    Authorization Type [employer]    OT Start Time 1230    OT Stop Time 1317    OT Time Calculation (min) 47 min    Activity Tolerance Patient tolerated treatment well    Behavior During Therapy WFL for tasks assessed/performed             Past Medical History:  Diagnosis Date   Allergy    Arthritis     Past Surgical History:  Procedure Laterality Date   TOTAL HIP ARTHROPLASTY 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   Procedure: TOTAL HIP ARTHROPLASTY ANTERIOR APPROACH;  Surgeon: Mcarthur Rossetti, MD;  Location: [facility];  Service: Orthopedics;  Laterality: Left;    There were no vitals filed for this visit.   Subjective Assessment - 12/23/20 1233     Subjective  Pt reports being worn out due to watching her granddaughter today and getting ready for Christmas.    Pertinent History HTN and HLD    Patient Stated Goals I want to write    Currently in Pain? No/denies               Pegboard activity with small pegs replicating pattern.  Increased challenge to placing 5 pegs in hand to facilitate in-hand manipulation and translation of pegs from palm of hand to finger tips.  Pt with increased difficulty and dropping of pegs with translation, due to difficulty coordinating thumb with first 2 fingers.  Discussed focus on intrinsic hand movements.    Pinch strength and coordination with resistive clothespins (1-8# resistance) with focus on sustained pinch with 3 jaw chuck to focus on sustained pinch as needed for handwriting.  Modified task to  only use of 4# and 6# with alternating grasp lateral pinch and 3 jaw chuck. Removed clothespins with tip to tip pinch.  Min cues for technique with lateral pinch.  Handwriting activity with maze - noted difficulty with turning corners and crossing lines x2 when switching directions. Directed pt to write 3-5 sentence paragraph.  Letters got progressively smaller as the passage continued, with pt making more mistakes and words becoming less legible due to size and mistakes.  Again encouraged pt to write larger and use lined paper for increased sizing of letters.  Discussed and trialed various types of pens to assess if change in legibility.  Pt became tearful as she attempted to write her name repeatedly.  Again discussed hand fatigue and compared to larger muscle exercise and fatigue.                     OT Short Term Goals - 12/09/20 1059       OT SHORT TERM GOAL #1   Title Pt will be Independent in HEP with focus on [facility] and strengthening as needed for IADLs/leisure pursuits.    Time 4    Period Weeks    Status On-going    Target Date 12/31/20      OT SHORT TERM GOAL #2   Title Pt will  verbalize 3 strategies to increase endurance during home making and/or gardening tasks.    Time 4    Period Weeks    Status On-going               OT Long Term Goals - 12/09/20 1059       OT LONG TERM GOAL #1   Title Pt will demonstrate improvements in 9 hole peg test by 3 seconds to increase coordination and needed for handwriting and leisure pursuits.    Baseline L: 28.15 and R: 31.47    Time 8    Period Weeks    Status On-going      OT LONG TERM GOAL #2   Title Pt will improve box and blocks score by 10 on R to demonstrate improved strength and GMC as needed for ADLs and IADLs    Baseline R: 38 and L: 55    Time 8    Period Weeks    Status On-going      OT LONG TERM GOAL #3   Title Pt will demonstrate increased shoulder flexion to achieve high reach to obtain items from  higher surfaces as needed for ADLs/IADLs.    Baseline shoulder flexion R: 140 and L: 150    Time 8    Period Weeks    Status On-going      OT LONG TERM GOAL #4   Title Pt will report ability to style hair with increased endurance and </= to 2 rest breaks.    Time 8    Period Weeks    Status On-going                   Plan - 12/23/20 1323     Clinical Impression Statement Pt is continuing to voice and demonstrate decreased legibility and increasingly smaller font as passage continues and as she fatigues. Pt reports enjoying her putty and is using personal tracing and word search workbooks at home to continue to improve her handwriting. Pt reports fatigue in hand with functional tasks, most spefically her handwriting, use of tweezers and nail clippers and occasionally with haircare and opening contiainers but reports that is improving.  Pt is receptive to education and demonstrating carryover from previous sessions and will continue to benefit from skilled OT to address impairments impacting her ability to complete ADLs, IADLs, and leisure tasks.    OT Occupational Profile and History Problem Focused Assessment - Including review of records relating to presenting problem    Occupational performance deficits (Please refer to evaluation for details): ADL's;IADL's;Leisure    Body Structure / Function / Physical Skills ADL;Body mechanics;Coordination;FMC;Endurance;Dexterity;GMC;IADL;Mobility;ROM;Proprioception;Pain;Strength;UE functional use    Rehab Potential Good    Clinical Decision Making Limited treatment options, no task modification necessary    Comorbidities Affecting Occupational Performance: May have comorbidities impacting occupational performance    Modification or Assistance to Complete Evaluation  No modification of tasks or assist necessary to complete eval    OT Frequency 2x / week    OT Duration 8 weeks    OT Treatment/Interventions Self-care/ADL  training;Paraffin;Electrical Stimulation;Cryotherapy;Ultrasound;Moist Heat;Fluidtherapy;Therapeutic exercise;Neuromuscular education;Energy conservation;Manual Therapy;Functional Mobility Training;DME and/or AE instruction;Passive range of motion;Patient/family education;Therapeutic activities;Balance training;Psychosocial skills training;Coping strategies training    Plan [facility] for handwriting and overhead reaching as needed for haircare and gardening.    OT Home Exercise Plan tweezer and finger nail clipper activities    Consulted and Agree with Plan of Care Patient  Patient will benefit from skilled therapeutic intervention in order to improve the following deficits and impairments:   Body Structure / Function / Physical Skills: ADL, Body mechanics, Coordination, FMC, Endurance, Dexterity, GMC, IADL, Mobility, ROM, Proprioception, Pain, Strength, UE functional use       Visit Diagnosis: Other lack of coordination  Hemiplegia and hemiparesis following cerebral infarction affecting right dominant side (HCC)  Muscle weakness (generalized)    Problem List Patient Active Problem List   Diagnosis Date Noted   Adjustment disorder with depressed mood 10/28/2020   Acute CVA (cerebrovascular accident) ([place]) - with RUE > RLE weakness and expressive aphasia 10/16/2020   Underweight 10/14/2020   Stress 10/14/2020   Allergic rhinitis 10/01/2020   Lipoma 08/21/2020   Osteoporosis 08/21/2020   Femoral fracture ([place]) 09/24/2018   Left displaced femoral neck fracture ([place]) 09/23/2018   Pulmonary nodules 04/10/2014   Thyroid nodule 12/12/2013   Hemangioma of liver 12/12/2013    Simonne Come, OT/L 12/23/2020, 1:26 PM  [facility] [address], [place], [place], [address] Phone: [phone]   Fax:  [phone]  Name: Latoya Wright MRN: [id number] Date of Birth: 02-Oct-1945

## 2020-12-23 NOTE — Patient Instructions (Signed)
Additional fine motor activities: - tweezers to pick up small items ie toothpicks - nail clippers to pinch putty or other item   Focus is on strength and coordination to continue to improve handwriting.

## 2020-12-23 NOTE — Patient Instructions (Signed)
Practicing each day will make it easier to talk more clearly in conversation.

## 2020-12-23 NOTE — Therapy (Signed)
[facility] [address], [place], [place], [address] Phone: [phone]   Fax:  [phone]  Speech Language Pathology Treatment  Patient Details  Name: Latoya Wright MRN: [id number] Date of Birth: Oct 13, 1945 Referring Provider (SLP): Dimas Chyle MD   Encounter Date: 12/23/2020   End of Session - 12/23/20 1603     Visit Number 4    Number of Visits 17    Date for SLP Re-Evaluation 02/06/21   extended slightly due to holidays   SLP Start Time 1318    SLP Stop Time  1400    SLP Time Calculation (min) 42 min    Activity Tolerance Patient tolerated treatment well             Past Medical History:  Diagnosis Date   Allergy    Arthritis     Past Surgical History:  Procedure Laterality Date   TOTAL HIP ARTHROPLASTY 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   Procedure: TOTAL HIP ARTHROPLASTY ANTERIOR APPROACH;  Surgeon: Mcarthur Rossetti, MD;  Location: [facility];  Service: Orthopedics;  Laterality: Left;    There were no vitals filed for this visit.   Subjective Assessment - 12/23/20 1322     Subjective Pt brought back  her Communication Participation PROM - score of 1/30.    Currently in Pain? No/denies                   ADULT SLP TREATMENT - 12/23/20 1340       General Information   Behavior/Cognition Alert;Cooperative;Pleasant mood      Treatment Provided   Treatment provided Cognitive-Linquistic      Cognitive-Linquistic Treatment   Treatment focused on Dysarthria    Skilled Treatment From pt's PROM (communicative participation form) she is not engaging in conversation and communicative situations like she did prior to CVA. Today with sentence responses it took pt 7-8 repsponses to have SLP note she was using overarticulation. For next 4 responses pt articulation was improved. SLP had pt work through her HEP and pt reported she does HEP approx 4 days/week. SLP encouraged at least 6 days/week, with no less than 7 reps at  one time - total of not < 25 reps. Pt acknowledged understanding. In two sentence responses pt began with faster rate that didn't foster overarticulation but then as SLP cued her for overarticulation she indpendently slowed her rate and overarticulated with occasional min A. SLP suggested to pt that she read 20 of the conversational sentences prior to practicing any of her worksheets as a warm up for feeling the overarticulation she needs to exchibit in practice in order to carryover to conversational speech.      Assessment / Recommendations / Plan   Plan Continue with current plan of care      Progression Toward Goals   Progression toward goals Progressing toward goals                SLP Short Term Goals - 12/23/20 1604       SLP SHORT TERM GOAL #1   Title pt will complete HEP for oral motor strengthening with rare min A over 2 sessions    Baseline 12-09-20    Status Achieved    Target Date 01/02/21      SLP SHORT TERM GOAL #2   Title pt will have no more than 3 episodes of unintelligible speech in 5 minutes simple conversation in 3 sessions, using speech ocmpensation techniques    Time  4    Period Weeks    Status On-going    Target Date 01/02/21      SLP SHORT TERM GOAL #3   Title pt will produce sentence responses to verbal or written stimuli with average low 70s dB in 3 sessions    Time 4    Period Weeks    Status Deferred    Target Date 01/02/21              SLP Long Term Goals - 12/23/20 1608       SLP LONG TERM GOAL #1   Title pt will have no more than 2 episodes of unintelligible speech in 10 minutes simple conversation in 3 sessions, using speech compensation techniques    Time 8    Period Weeks    Status On-going      SLP LONG TERM GOAL #2   Title pt will demo oral-motor HEP independently in 3 sessions    Time 8    Period Weeks    Status On-going      SLP LONG TERM GOAL #3   Title pt will indicate less need for lingual sweep during meals than  prior to outpatient ST, during last 1-3 ST sessions    Time 8    Period Weeks    Status On-going      SLP LONG TERM GOAL #4   Title pt will improve her PROM score taken from the first 1-2 sessions of outpatient ST    Time 8    Period Weeks    Status On-going      SLP LONG TERM GOAL #5   Title pt will produce 10 minutes simple-mod complex conversation with average low 70s dB in 3 sessions    Time 8    Period Weeks    Status On-going              Plan - 12/23/20 1604     Clinical Impression Statement Pt Latoya Wright "Latoya Wright" presents today with WNL langauge and cognitive ability but with mild-mod ataxic dysarthria, 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 See "skilled intervention" for more details. Pt would beneift from skilled ST targeting oral-motor HEP (inlcuding increasing speech volume), and compensatory measures in order to improve her speech intelligbilty.    Speech Therapy Frequency 2x / week    Duration 8 weeks    Treatment/Interventions Internal/external aids;Compensatory strategies;Environmental controls;SLP instruction and feedback;Oral motor exercises;Patient/family education    Potential to Achieve Goals Good    SLP Home Exercise Plan pt to bring next session    Consulted and Agree with Plan of Care Patient             Patient will benefit from skilled therapeutic intervention in order to improve the following deficits and impairments:   Dysarthria and anarthria  Dysphagia, unspecified type    Problem List Patient Active Problem List   Diagnosis Date Noted   Adjustment disorder with depressed mood 10/28/2020   Acute CVA (cerebrovascular accident) [facility]) - with RUE > RLE weakness and expressive aphasia 10/16/2020   Underweight 10/14/2020   Stress 10/14/2020   Allergic rhinitis 10/01/2020   Lipoma 08/21/2020   Osteoporosis 08/21/2020   Femoral fracture ([place]) 09/24/2018   Left displaced femoral neck fracture ([place]) 09/23/2018   Pulmonary  nodules 04/10/2014   Thyroid nodule 12/12/2013   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 [place], [place], [address] Phone: [phone]   Fax:  [phone]   Name: Latoya Wright MRN: [id number] Date of Birth: 05-10-1945

## 2020-12-24 ENCOUNTER — Other Ambulatory Visit: Payer: Self-pay

## 2020-12-24 ENCOUNTER — Emergency Department (HOSPITAL_BASED_OUTPATIENT_CLINIC_OR_DEPARTMENT_OTHER)
Admission: EM | Admit: 2020-12-24 | Discharge: 2020-12-24 | Disposition: A | Discharge status: Home / Self Care | Payer: Medicare PPO | Attending: Emergency Medicine | Admitting: Emergency Medicine

## 2020-12-24 ENCOUNTER — Emergency Department (HOSPITAL_BASED_OUTPATIENT_CLINIC_OR_DEPARTMENT_OTHER): Payer: Medicare PPO

## 2020-12-24 ENCOUNTER — Encounter (HOSPITAL_BASED_OUTPATIENT_CLINIC_OR_DEPARTMENT_OTHER): Payer: Self-pay

## 2020-12-24 DIAGNOSIS — Z96642 Presence of left artificial hip joint: Secondary | ICD-10-CM | POA: Insufficient documentation

## 2020-12-24 DIAGNOSIS — S22060A Wedge compression fracture of T7-T8 vertebra, initial encounter for closed fracture: Secondary | ICD-10-CM | POA: Diagnosis not present

## 2020-12-24 DIAGNOSIS — W108XXA Fall (on) (from) other stairs and steps, initial encounter: Secondary | ICD-10-CM | POA: Diagnosis not present

## 2020-12-24 DIAGNOSIS — S20409A Unspecified superficial injuries of unspecified back wall of thorax, initial encounter: Secondary | ICD-10-CM | POA: Diagnosis present

## 2020-12-24 DIAGNOSIS — Z79899 Other long term (current) drug therapy: Secondary | ICD-10-CM | POA: Insufficient documentation

## 2020-12-24 DIAGNOSIS — S2232XA Fracture of one rib, left side, initial encounter for closed fracture: Secondary | ICD-10-CM | POA: Diagnosis not present

## 2020-12-24 DIAGNOSIS — Z7982 Long term (current) use of aspirin: Secondary | ICD-10-CM | POA: Insufficient documentation

## 2020-12-24 DIAGNOSIS — S32009A Unspecified fracture of unspecified lumbar vertebra, initial encounter for closed fracture: Secondary | ICD-10-CM

## 2020-12-24 MED ORDER — HYDROCODONE-ACETAMINOPHEN 5-325 MG PO TABS
1.0000 | ORAL_TABLET | Freq: Once | ORAL | Status: AC
  Administered 2020-12-24: 1 via ORAL
  Filled 2020-12-24: qty 1

## 2020-12-24 MED ORDER — LIDOCAINE 5 % EX PTCH: 1.0000 | MEDICATED_PATCH | Freq: Every day | CUTANEOUS | 0 refills | Status: DC | PRN

## 2020-12-24 MED ORDER — METHOCARBAMOL 500 MG PO TABS
1000.0000 mg | ORAL_TABLET | Freq: Once | ORAL | Status: AC
  Administered 2020-12-24: 1000 mg via ORAL
  Filled 2020-12-24: qty 2

## 2020-12-24 MED ORDER — ACETAMINOPHEN-CODEINE #3 300-30 MG PO TABS
1.0000 | ORAL_TABLET | Freq: Four times a day (QID) | ORAL | 0 refills | Status: DC | PRN
Start: 2020-12-24 — End: 2020-12-29

## 2020-12-24 MED ORDER — METHOCARBAMOL 500 MG PO TABS: 500.0000 mg | ORAL_TABLET | Freq: Two times a day (BID) | ORAL | 0 refills | Status: DC

## 2020-12-24 NOTE — Discharge Instructions (Addendum)
Your fractures visualized today on CT do appear to be old. Please return to the ER for any worsening or worrisome symptoms.

## 2020-12-24 NOTE — ED Notes (Signed)
Slipped on stairs and  hit her back, states fell about 2 weeks ago and broker her tail bone 2 weeks after she had a stroke  , denies loc  or weakness  states feet slipped out from under her

## 2020-12-24 NOTE — ED Triage Notes (Signed)
Patient here POV from Home from Fall.  Patient suffered Mechanical Fall going downstairs today. Patient complaining of Pain to Upper Back and Upper Buttock.   Hx of Stroke approximately 2 months PTA.  NAD Noted during Triage. A&Ox4. GCS 15. Ambulatory. Takes 81 ASA. No Major Blood Thinning Medications. No Head Injury. No LOC.

## 2020-12-24 NOTE — ED Provider Notes (Signed)
[facility] EMERGENC[facility] DEPT Provider Note   CSN: [id number] Arrival date & time: 12/24/20  1616     History Chief Complaint  Patient presents with   Lytle Michaels    Latoya Wright is a 75 y.o. female.  This is a 75 y.o. female with significant medical history as below, including prior CVA, osteoporosis, prior left side femoral neck fracture who presents to the ED with complaint of following gait mechanical fall, back pain.  Patient reports she was ambulating up the stairs in her home, her feet slipped on the step and she lost her footing, fell backwards onto her backside.  Slid down approximate 4-5 steps.  She has been experiencing pain to her mid thoracic, lumbar spine region.  Left side greater than right.  Pain to her buttock region.  The history is provided by the patient. No language interpreter was used.  Fall      Past Medical History:  Diagnosis Date   Allergy    Arthritis     Patient Active Problem List   Diagnosis Date Noted   Adjustment disorder with depressed mood 10/28/2020   Acute CVA (cerebrovascular accident) [facility]) - with RUE > RLE weakness and expressive aphasia 10/16/2020   Underweight 10/14/2020   Stress 10/14/2020   Allergic rhinitis 10/01/2020   Lipoma 08/21/2020   Osteoporosis 08/21/2020   Femoral fracture ([place]) 09/24/2018   Left displaced femoral neck fracture ([place]) 09/23/2018   Pulmonary nodules 04/10/2014   Thyroid nodule 12/12/2013   Hemangioma of liver 12/12/2013    Past Surgical History:  Procedure Laterality Date   TOTAL HIP ARTHROPLASTY 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   Procedure: TOTAL HIP ARTHROPLASTY ANTERIOR APPROACH;  Surgeon: Mcarthur Rossetti, MD;  Location: [facility];  Service: Orthopedics;  Laterality: Left;     OB History   No obstetric history on file.     Family History  Problem Relation Age of Onset   Heart disease Father    Heart disease Maternal Grandmother    Heart disease Maternal Grandfather    Heart disease Paternal  Grandmother    Heart disease Paternal Grandfather    Rheum arthritis Sister    Rheum arthritis Brother     Social History   Tobacco Use   Smoking status: Never   Smokeless tobacco: Never  Substance Use Topics   Alcohol use: No   Drug use: No    Home Medications Prior to Admission medications   Medication Sig Start Date End Date Taking? Authorizing Provider  acetaminophen-codeine (TYLENOL #3) 300-30 MG tablet Take 1-2 tablets by mouth every 6 (six) hours as needed for moderate pain. 12/24/20  Yes Wynona Dove A, DO  lidocaine (LIDODERM) 5 % Place 1 patch onto the skin daily as needed. Remove & Discard patch within 12 hours or as directed by MD 12/24/20  Yes Jeanell Sparrow, DO  methocarbamol (ROBAXIN) 500 MG tablet Take 1 tablet (500 mg total) by mouth 2 (two) times daily for 5 days. 12/24/20 12/29/20 Yes Jeanell Sparrow, DO  aspirin EC 81 MG EC tablet Take 1 tablet (81 mg total) by mouth daily. Swallow whole. 10/21/20   Mercy Riding, MD  atorvastatin (LIPITOR) 40 MG tablet Take 1 tablet (40 mg total) by mouth at bedtime. 10/21/20   Mercy Riding, MD  Biotin 5 MG TABS Take 5 mg by mouth daily.    [provider]  calcium-vitamin D (OSCAL 500/200 D-3) 500-200 MG-UNIT tablet Take 1 tablet by mouth 2 (two) times daily. 10/21/20  10/21/21  Mercy Riding, MD    Allergies    Dayquil [pseudoephedrine-apap-dm]  Review of Systems   Review of Systems  Physical Exam Updated Vital Signs BP (!) 140/57 (BP Location: Right Arm)   Pulse 77   Temp 98.2 F (36.8 C) (Oral)   Resp 16   Ht 5\' 8"  (1.727 m)   Wt 47.6 kg   SpO2 100%   BMI 15.96 kg/m   Physical Exam  ED Results / Procedures / Treatments   Labs (all labs ordered are listed, but only abnormal results are displayed) Labs Reviewed - No data to display  EKG None  Radiology CT Thoracic Spine Wo Contrast  Result Date: 12/24/2020 CLINICAL DATA:  Low back pain, trauma; Low back pain, increased fracture risk EXAM: CT  THORACIC AND LUMBAR SPINE WITHOUT CONTRAST TECHNIQUE: Multidetector CT imaging of the thoracic and lumbar spine was performed without contrast. Multiplanar CT image reconstructions were also generated. COMPARISON:  Chest CT 10/31/2013, two-view chest radiograph 01/16/2017 FINDINGS: CT THORACIC SPINE FINDINGS Alignment: Normal. Vertebrae: There is an anterior compression fracture of T7 with approximately 30% height loss. No bony retropulsion. Paraspinal and other soft tissues: Subcentimeter right thyroid nodule which requires no follow-up. Biapical pleuroparenchymal scarring. Disc levels: Mild multilevel degenerative disc disease throughout the thoracic spine. No visible impingement. CT LUMBAR SPINE FINDINGS Segmentation: 5 lumbar type vertebrae. Alignment: Normal. Vertebrae: There are subacute appearing, nondisplaced fractures of the left L2, L3, and L4 transverse processes. There is a subacute appearing, nondisplaced left posterior twelfth rib fracture. No evidence of vertebral body compression fracture. Paraspinal and other soft tissues: Multiple left-sided renal sinus cysts. Aortoiliac atherosclerotic calcifications. No acute abnormality. Paraspinal muscle atrophy more prominent in the lower aspect. Disc levels: There is multilevel degenerative disc disease and worst at L3-L4 and L4-L5 with moderate to severe disc height loss. There is multilevel disc bulging and bilateral facet arthropathy resulting in probable mild spinal canal stenosis at L3-L4 and L4-L5. The neural foramina appear patent on noncontrast CT. IMPRESSION: CT THORACIC SPINE IMPRESSION Anterior compression fracture of T7 with approximately 30% height loss. No bony retropulsion. CT LUMBAR SPINE IMPRESSION Nondisplaced fractures of the left L2, L3, and L4 transverse processes as well as the left posterior twelfth rib. These fractures appear subacute. No evidence of vertebral body compression fracture in the lumbar spine. Multilevel degenerative  changes of the spine as described above. Electronically Signed   By: Maurine Simmering M.D.   On: 12/24/2020 18:06   CT Lumbar Spine Wo Contrast  Result Date: 12/24/2020 CLINICAL DATA:  Low back pain, trauma; Low back pain, increased fracture risk EXAM: CT THORACIC AND LUMBAR SPINE WITHOUT CONTRAST TECHNIQUE: Multidetector CT imaging of the thoracic and lumbar spine was performed without contrast. Multiplanar CT image reconstructions were also generated. COMPARISON:  Chest CT 10/31/2013, two-view chest radiograph 01/16/2017 FINDINGS: CT THORACIC SPINE FINDINGS Alignment: Normal. Vertebrae: There is an anterior compression fracture of T7 with approximately 30% height loss. No bony retropulsion. Paraspinal and other soft tissues: Subcentimeter right thyroid nodule which requires no follow-up. Biapical pleuroparenchymal scarring. Disc levels: Mild multilevel degenerative disc disease throughout the thoracic spine. No visible impingement. CT LUMBAR SPINE FINDINGS Segmentation: 5 lumbar type vertebrae. Alignment: Normal. Vertebrae: There are subacute appearing, nondisplaced fractures of the left L2, L3, and L4 transverse processes. There is a subacute appearing, nondisplaced left posterior twelfth rib fracture. No evidence of vertebral body compression fracture. Paraspinal and other soft tissues: Multiple left-sided renal sinus cysts. Aortoiliac atherosclerotic calcifications. No  acute abnormality. Paraspinal muscle atrophy more prominent in the lower aspect. Disc levels: There is multilevel degenerative disc disease and worst at L3-L4 and L4-L5 with moderate to severe disc height loss. There is multilevel disc bulging and bilateral facet arthropathy resulting in probable mild spinal canal stenosis at L3-L4 and L4-L5. The neural foramina appear patent on noncontrast CT. IMPRESSION: CT THORACIC SPINE IMPRESSION Anterior compression fracture of T7 with approximately 30% height loss. No bony retropulsion. CT LUMBAR SPINE  IMPRESSION Nondisplaced fractures of the left L2, L3, and L4 transverse processes as well as the left posterior twelfth rib. These fractures appear subacute. No evidence of vertebral body compression fracture in the lumbar spine. Multilevel degenerative changes of the spine as described above. Electronically Signed   By: Maurine Simmering M.D.   On: 12/24/2020 18:06   DG Chest Portable 1 View  Result Date: 12/24/2020 CLINICAL DATA:  Fall. EXAM: PORTABLE CHEST 1 VIEW COMPARISON:  Chest x-ray 04/10/2014. FINDINGS: The lungs are hyperinflated, unchanged. There is stable pleuroparenchymal scarring in the right lung apex. There is no focal lung infiltrate, pleural effusion or pneumothorax. The cardiomediastinal silhouette is within normal limits. No acute fractures. IMPRESSION: No active disease. Electronically Signed   By: Ronney Asters M.D.   On: 12/24/2020 18:06    Procedures Procedures   Medications Ordered in ED Medications  HYDROcodone-acetaminophen (NORCO/VICODIN) 5-325 MG per tablet 1 tablet (1 tablet Oral Given 12/24/20 1913)  methocarbamol (ROBAXIN) tablet 1,000 mg (1,000 mg Oral Given 12/24/20 1914)    ED Course  I have reviewed the triage vital signs and the nursing notes.  Pertinent labs & imaging results that were available during my care of the patient were reviewed by me and considered in my medical decision making (see chart for details).    MDM Rules/Calculators/A&P                           CC: fall  This patient complains of fall; this involves an extensive number of treatment options and is a complaint that carries with it a high risk of complications and morbidity. Vital signs were reviewed. Serious etiologies considered.  Record review:   Previous records obtained and reviewed   Additional history obtained from spouse   Work up as above, notable for:  imaging results that were available during my care of the patient were reviewed by me and considered in my medical  decision making.   I ordered imaging studies which included CT thoracic/lumbar and CXR and I independently visualized and interpreted imaging which showed lumbar transverse fx subacture, t7 compression fx subacute, 12th rib fx.    Management: Given norco, robaxin  Discussed with neurosurgeon Dr. Christella Noa who personally reviewed the images and does confirm that the fractures do appear to be subacute.  No acute neurosurgery intervention or follow-up is required at this time.  Reassessment:  Patient has water pain is greatly improved following intervention.  Discussed with patient and spouse regarding findings from today supportive care at home and return precautions.  Given incentive spirometer and instructed upon use.   The patient improved significantly and was discharged in stable condition. Detailed discussions were had with the patient regarding current findings, and need for close f/u with PCP or on call doctor. The patient has been instructed to return immediately if the symptoms worsen in any way for re-evaluation. Patient verbalized understanding and is in agreement with current care plan. All questions answered prior to discharge.  This chart was dictated using voice recognition software.  Despite best efforts to proofread,  errors can occur which can change the documentation meaning.  Final Clinical Impression(s) / ED Diagnoses Final diagnoses:  Compression fracture of T7 vertebra, initial encounter ([place])  Lumbar transverse process fracture, closed, initial encounter ([place])  Closed fracture of one rib of left side, initial encounter  Fall (on) (from) other stairs and steps, initial encounter    Rx / DC Orders ED Discharge Orders          Ordered    methocarbamol (ROBAXIN) 500 MG tablet  2 times daily        12/24/20 1951    lidocaine (LIDODERM) 5 %  Daily PRN        12/24/20 1951    acetaminophen-codeine (TYLENOL #3) 300-30 MG tablet  Every 6 hours PRN         12/24/20 1951             Jeanell Sparrow, DO 12/24/20 1954

## 2020-12-29 ENCOUNTER — Ambulatory Visit: Payer: Medicare PPO | Admitting: Physician Assistant

## 2020-12-29 ENCOUNTER — Ambulatory Visit: Payer: Medicare PPO

## 2020-12-29 ENCOUNTER — Ambulatory Visit: Payer: Medicare PPO | Admitting: Occupational Therapy

## 2020-12-29 ENCOUNTER — Encounter: Payer: Self-pay | Admitting: Physician Assistant

## 2020-12-29 ENCOUNTER — Other Ambulatory Visit: Payer: Self-pay

## 2020-12-29 VITALS — BP 174/72 | HR 79 | Temp 97.8°F | Ht 68.0 in | Wt 105.4 lb

## 2020-12-29 DIAGNOSIS — M810 Age-related osteoporosis without current pathological fracture: Secondary | ICD-10-CM

## 2020-12-29 DIAGNOSIS — W19XXXD Unspecified fall, subsequent encounter: Secondary | ICD-10-CM | POA: Diagnosis not present

## 2020-12-29 DIAGNOSIS — M545 Low back pain, unspecified: Secondary | ICD-10-CM | POA: Diagnosis not present

## 2020-12-29 DIAGNOSIS — I639 Cerebral infarction, unspecified: Secondary | ICD-10-CM

## 2020-12-29 MED ORDER — HYDROCODONE-ACETAMINOPHEN 5-325 MG PO TABS: 1.0000 | ORAL_TABLET | Freq: Four times a day (QID) | ORAL | 0 refills | Status: DC | PRN

## 2020-12-29 MED ORDER — MELOXICAM 7.5 MG PO TABS: 7.5000 mg | ORAL_TABLET | Freq: Every day | ORAL | 1 refills | Status: DC

## 2020-12-29 NOTE — Progress Notes (Signed)
Latoya Wright is a 75 y.o. female here for a follow up of recent fall.   History of Present Illness:   Chief Complaint  Patient presents with   Follow-up    Pt came in for ED follow up from fall; pt is in OT and speech therapy from previous stroke.     HPI  Latoya Wright presents to today's visit with her husband, Latoya Wright.   Recent fall On 12/24/20, Latoya Wright presented to the ED following a fall down her steps. Latoya Wright recalled walking up her steps when he foot slipped causing her to slide down 4-5 steps backwards and land on her backside. At the time she stated she was experiencing pain to her mid thoracic, lumbar spine region. The pain was greater on the left side and buttock region. Immediately pt underwent a CT of her thoracic and lumbar spine without contrast as well as a DG chest portable view. Results showed that she had a anterior compression fracture of T7 with 30% height loss and nondisplaced fractures of the left L2,L3, and L4 transverse processes as well as the left posterior twelfth rib. There was no evidence of vertebral body compression fracture in the lumbar spine or bony retropulsion.   Following her visit, it was concluded that no acute neurosurgery intervention or follow up was required at that time. Latoya Wright was prescribed robaxin 500 mg twice daily for five days and recommended to follow up with her PCP.   During today's visit, Latoya Wright states she hadn't taken her robaxin due to pain not being relieved. In an attempt to manage her pain, she has taken her husband's, Latoya Wright, hydrocodone which has benefited her greatly but she knows this practice is not recommended. At this time, she is interested in receiving something else to manage her pain since she has to travel for her brother in [place].  Latoya Wright is currently receiving prolia injections and had her last DEXA scan on 08/11/20 due to hx of osteoporosis. She is interested in having this updated following her recent fall.     Hx of Acute CVA ON 10/16/20, Latoya Wright presented to the ED with c/o right sided weakness, speech difficulty, and blurred vision. States she began to experience symptoms shortly after working in her yard. According to pt, the symptoms resolved about an hour later upon EMS arrival. 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 After consulting Neurology, it was recommended that she be given plavix and aspirin in addition to undergoing OT.   Pt has since followed up with Latoya Wright, neurology and is currently participating in PT, OT, and speech therapy regularly. She is managing well.    Past Medical History:  Diagnosis Date   Allergy    Arthritis      Social History   Tobacco Use   Smoking status: Never   Smokeless tobacco: Never  Substance Use Topics   Alcohol use: No   Drug use: No    Past Surgical History:  Procedure Laterality Date   TOTAL HIP ARTHROPLASTY 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   Procedure: TOTAL HIP ARTHROPLASTY ANTERIOR APPROACH;  Surgeon: Mcarthur Rossetti, MD;  Location: [facility];  Service: Orthopedics;  Laterality: Left;    Family History  Problem Relation Age of Onset   Heart disease Father    Heart disease Maternal Grandmother    Heart disease Maternal Grandfather    Heart disease Paternal Grandmother    Heart disease  Paternal Grandfather    Rheum arthritis Sister    Rheum arthritis Brother     Allergies  Allergen Reactions   Dayquil [Pseudoephedrine-Apap-Dm]     jittery    Current Medications:   Current Outpatient Medications:    acetaminophen-codeine (TYLENOL #3) 300-30 MG tablet, Take 1-2 tablets by mouth every 6 (six) hours as needed for moderate pain., Disp: 5 tablet, Rfl: 0   aspirin EC 81 MG EC tablet, Take 1 tablet (81 mg total) by mouth daily. Swallow whole., Disp: 30 tablet, Rfl: 11   atorvastatin (LIPITOR) 40 MG tablet, Take 1 tablet (40 mg total) by mouth at bedtime., Disp: 90  tablet, Rfl: 1   Biotin 5 MG TABS, Take 5 mg by mouth daily., Disp: , Rfl:    calcium-vitamin D (OSCAL 500/200 D-3) 500-200 MG-UNIT tablet, Take 1 tablet by mouth 2 (two) times daily., Disp: 180 tablet, Rfl: 3   lidocaine (LIDODERM) 5 %, Place 1 patch onto the skin daily as needed. Remove & Discard patch within 12 hours or as directed by MD (Patient not taking: Reported on 12/29/2020), Disp: 15 patch, Rfl: 0   methocarbamol (ROBAXIN) 500 MG tablet, Take 1 tablet (500 mg total) by mouth 2 (two) times daily for 5 days. (Patient not taking: Reported on 12/29/2020), Disp: 10 tablet, Rfl: 0   Review of Systems:   ROS Negative unless otherwise specified per HPI. Vitals:   Vitals:   12/29/20 1255  BP: (!) 174/72  Pulse: 79  Temp: 97.8 F (36.6 C)  TempSrc: Temporal  SpO2: 99%  Weight: 105 lb 6.4 oz (47.8 kg)  Height: 5\' 8"  (1.727 m)     Body mass index is 16.03 kg/m.  Physical Exam:   Physical Exam Vitals and nursing note reviewed.  Constitutional:      General: She is not in acute distress.    Appearance: She is well-developed. She is not ill-appearing or toxic-appearing.  Cardiovascular:     Rate and Rhythm: Normal rate and regular rhythm.     Pulses: Normal pulses.     Heart sounds: Normal heart sounds, S1 normal and S2 normal.  Pulmonary:     Effort: Pulmonary effort is normal.     Breath sounds: Normal breath sounds.  Skin:    General: Skin is warm and dry.  Neurological:     Mental Status: She is alert.     GCS: GCS eye subscore is 4. GCS verbal subscore is 5. GCS motor subscore is 6.  Psychiatric:        Speech: Speech normal.        Behavior: Behavior normal. Behavior is cooperative.    Assessment and Plan:   Acute CVA (cerebrovascular accident) ([place]) Advised pt to complete OT and speech therapy as instructed by providers and her neurology team Denies need for PT at this time Continue Lipitor 40 mg daily Aspirin EC 81 mg daily   Fall, subsequent encounter;  LBP No red flags Stop acetaminophen-codeine as this is not effective for her Start hydrocodone-acetaminophen 325 mg as needed every six hours  Start mobic 7.5 mg daily; avoid NSAIDs while on mobic Consider referral to sports medicine/PT/neurosurgery if indicated Follow up if new or worsening symptoms occur  Osteoporosis UTD on Dexa Continue prolia per orders Follow-up with PCP  Nehemiah Massed C Ratchford,acting as a scribe for [employer], PA.,have documented all relevant documentation on the behalf of Inda Coke, PA,as directed by  Inda Coke, PA while in the presence of Inda Coke,  PA.  Faythe Dingwall, PA, have reviewed all documentation for this visit. The documentation on 12/29/20 for the exam, diagnosis, procedures, and orders are all accurate and complete.   Inda Coke, PA-C

## 2020-12-29 NOTE — Patient Instructions (Signed)
It was great to see you!  I have sent in a new script for pain medication  Start mobic 7.5 mg daily for inflammation -- this is similar to ibuprofen and you do not need to take ibuprofen while on this  If new/worsening symptoms, let me and Dr Jerline Pain know.  Take care,  Inda Coke PA-C

## 2020-12-31 ENCOUNTER — Encounter: Payer: Self-pay | Admitting: Occupational Therapy

## 2021-01-05 ENCOUNTER — Encounter: Payer: Medicare PPO | Admitting: Occupational Therapy

## 2021-01-06 ENCOUNTER — Encounter: Payer: Self-pay | Admitting: Occupational Therapy

## 2021-01-06 ENCOUNTER — Other Ambulatory Visit: Payer: Self-pay

## 2021-01-06 ENCOUNTER — Ambulatory Visit: Payer: Medicare PPO | Attending: Family Medicine | Admitting: Occupational Therapy

## 2021-01-06 ENCOUNTER — Ambulatory Visit: Payer: Medicare PPO

## 2021-01-06 DIAGNOSIS — R278 Other lack of coordination: Secondary | ICD-10-CM | POA: Insufficient documentation

## 2021-01-06 DIAGNOSIS — R471 Dysarthria and anarthria: Secondary | ICD-10-CM | POA: Insufficient documentation

## 2021-01-06 DIAGNOSIS — R1310 Dysphagia, unspecified: Secondary | ICD-10-CM | POA: Diagnosis present

## 2021-01-06 DIAGNOSIS — I69351 Hemiplegia and hemiparesis following cerebral infarction affecting right dominant side: Secondary | ICD-10-CM | POA: Insufficient documentation

## 2021-01-06 DIAGNOSIS — M6281 Muscle weakness (generalized): Secondary | ICD-10-CM | POA: Insufficient documentation

## 2021-01-06 NOTE — Therapy (Signed)
[facility] [address], [place], [place], [address] Phone: [phone]   Fax:  [phone]  Speech Language Pathology Treatment  Patient Details  Name: Latoya Wright MRN: [id number] Date of Birth: [birth year] Referring Provider (SLP): Dimas Chyle MD   Encounter Date: 01/06/2021   End of Session - 01/06/21 1656     Visit Number 5    Number of Visits 17    Date for SLP Re-Evaluation 02/06/21   extended slightly due to holidays   SLP Start Time 1320    SLP Stop Time  1400    SLP Time Calculation (min) 40 min    Activity Tolerance Patient tolerated treatment well             Past Medical History:  Diagnosis Date   Allergy    Arthritis     Past Surgical History:  Procedure Laterality Date   TOTAL HIP ARTHROPLASTY 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   Procedure: TOTAL HIP ARTHROPLASTY ANTERIOR APPROACH;  Surgeon: Mcarthur Rossetti, MD;  Location: [facility];  Service: Orthopedics;  Laterality: Left;    There were no vitals filed for this visit.   Subjective Assessment - 01/06/21 1321     Subjective "I had a lot of people say if I didn't knwo you'd had (a stroke) I wouldn't know."    Currently in Pain? No/denies                   ADULT SLP TREATMENT - 01/06/21 1322       General Information   Behavior/Cognition Alert;Cooperative;Pleasant mood      Treatment Provided   Treatment provided Cognitive-Linquistic      Cognitive-Linquistic Treatment   Treatment focused on Dysarthria    Skilled Treatment Pt recited 20% of her homework with 95% reduced rate speech and 100% intelligibility with rare min A. In short conversational segments of 2-3 minutes pt demonstrated compensatory strategies for dysarthria 90% of the time, with rare cues to repeat due to decr'd intelligibility. On repeat, pt understood 100% of the time. "I get excited and then I speed up," pt stated. Whenever pt enters [employer] to visit with friends she is to  target slowed speech during the visit/s.      Assessment / Recommendations / Plan   Plan Continue with current plan of care      Progression Toward Goals   Progression toward goals Progressing toward goals              SLP Education - 01/06/21 1653     Education Details practicing continues to be important    Person(s) Educated Patient    Methods Explanation    Comprehension Verbalized understanding              SLP Short Term Goals - 01/06/21 1656       SLP SHORT TERM GOAL #1   Title pt will complete HEP for oral motor strengthening with rare min A over 2 sessions    Baseline 12-09-20    Status Achieved    Target Date 01/02/21      SLP SHORT TERM GOAL #2   Title pt will have no more than 3 episodes of unintelligible speech in 5 minutes simple conversation in 3 sessions, using speech ocmpensation techniques    Baseline 01-06-21    Time 4    Period Weeks    Status Partially Met   extended one week due to absences   Target Date 01/09/21  SLP SHORT TERM GOAL #3   Title pt will produce sentence responses to verbal or written stimuli with average low 70s dB in 3 sessions    Time 4    Period Weeks    Status Deferred    Target Date 01/02/21              SLP Long Term Goals - 01/06/21 1657       SLP LONG TERM GOAL #1   Title pt will have no more than 2 episodes of unintelligible speech in 10 minutes simple conversation in 3 sessions, using speech compensation techniques    Time 8    Period Weeks    Status On-going    Target Date 02/06/21      SLP LONG TERM GOAL #2   Title pt will demo oral-motor HEP independently in 3 sessions    Time 8    Period Weeks    Status On-going    Target Date 02/06/21      SLP LONG TERM GOAL #3   Title pt will indicate less need for lingual sweep during meals than prior to outpatient ST, during last 1-3 ST sessions    Time 8    Period Weeks    Status On-going    Target Date 02/06/21      SLP LONG TERM GOAL #4    Title pt will improve her PROM score taken from the first 1-2 sessions of outpatient ST    Time 8    Period Weeks    Status On-going    Target Date 02/06/21      SLP LONG TERM GOAL #5   Title pt will produce 10 minutes simple-mod complex conversation with average low 70s dB in 3 sessions    Time 8    Period Weeks    Status On-going    Target Date 02/06/21              Plan - 01/06/21 1656     Clinical Impression Statement Pt Latoya Epley "[name]" continues to present with WNL langauge and cognitive ability but with mild-mod ataxic dysarthria, 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 See "skilled intervention" for more details. Pt would beneift from continued skilled ST targeting oral-motor HEP (inlcuding increasing speech volume), and compensatory measures in order to improve her speech intelligbilty.    Speech Therapy Frequency 2x / week    Duration 8 weeks    Treatment/Interventions Internal/external aids;Compensatory strategies;Environmental controls;SLP instruction and feedback;Oral motor exercises;Patient/family education    Potential to Achieve Goals Good    SLP Home Exercise Plan pt to bring next session    Consulted and Agree with Plan of Care Patient             Patient will benefit from skilled therapeutic intervention in order to improve the following deficits and impairments:   Dysarthria and anarthria  Dysphagia, unspecified type    Problem List Patient Active Problem List   Diagnosis Date Noted   Adjustment disorder with depressed mood 10/28/2020   Acute CVA (cerebrovascular accident) [facility]) - with RUE > RLE weakness and expressive aphasia 10/16/2020   Underweight 10/14/2020   Stress 10/14/2020   Allergic rhinitis 10/01/2020   Lipoma 08/21/2020   Osteoporosis 08/21/2020   Femoral fracture ([place]) 09/24/2018   Left displaced femoral neck fracture ([place]) 09/23/2018   Pulmonary nodules 04/10/2014   Thyroid nodule 12/12/2013   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 [address] [place], [place], [address] Phone: [phone]   Fax:  [phone]   Name: Latoya Wright MRN: [id number] Date of Birth: 1945/10/26

## 2021-01-06 NOTE — Patient Instructions (Signed)
°  Whenever you go to [employer] I want you to target slowed speech!

## 2021-01-06 NOTE — Therapy (Signed)
[facility] [address], [place], [place], [address] Phone: [phone]   Fax:  [phone]  Occupational Therapy Treatment  Patient Details  Name: Latoya Wright MRN: [id number] Date of Birth: [birth year] Referring Provider (OT): Vivi Barrack. MD   Encounter Date: 01/06/2021   OT End of Session - 01/06/21 1508     Visit Number 5    Number of Visits 17    Date for OT Re-Evaluation 01/28/21    Authorization Type [employer]    OT Start Time 1405    OT Stop Time 1448    OT Time Calculation (min) 43 min    Activity Tolerance Patient tolerated treatment well    Behavior During Therapy WFL for tasks assessed/performed             Past Medical History:  Diagnosis Date   Allergy    Arthritis     Past Surgical History:  Procedure Laterality Date   TOTAL HIP ARTHROPLASTY 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   Procedure: TOTAL HIP ARTHROPLASTY ANTERIOR APPROACH;  Surgeon: Mcarthur Rossetti, MD;  Location: [facility];  Service: Orthopedics;  Laterality: Left;    There were no vitals filed for this visit.   Subjective Assessment - 01/06/21 1408     Subjective  Pt reports carryover of HEP and [place] tasks as well as writing activities, stating that she completes some "every time she walks by the table".    Pertinent History HTN and HLD    Patient Stated Goals I want to write    Currently in Pain? No/denies               FMC/GMC tasks with completion of Box and Blocks assessment with pt demonstrating improvements with R: 48 compared to (R: 38 and L: 55 on eval).  Pt continues to demonstrate decreased gross motor control and fatigue with repetitious activities.  Engaged in reaching activity while completing small peg board activity in standing to incorporate mid to high range reach.  Constant modification of task to incorporate in-hand manipulation and well as translation and rotation of pegs in finger tips.  Pt dropping ~25% of pegs with  translation and rotation.   Pt provided handwriting copies from home with noted improvements in legibility and size.  Pt continues to demonstrate decreased size and therefore legibility as she fatigues.  Pt able to recognize benefits of use of lined paper for increased legibility and size.  Educated on energy conservation strategies both for handwriting activities as well as IADLs.                   OT Education - 01/06/21 1519     Education Details Educated on energy conservation strategies    Person(s) Educated Patient    Methods Explanation    Comprehension Verbalized understanding              OT Short Term Goals - 01/06/21 1413       OT SHORT TERM GOAL #1   Title Pt will be Independent in HEP with focus on [facility] and strengthening as needed for IADLs/leisure pursuits.    Time 4    Period Weeks    Status Achieved    Target Date 12/31/20      OT SHORT TERM GOAL #2   Title Pt will verbalize 3 strategies to increase endurance during home making and/or gardening tasks.    Time 4    Period Weeks    Status  Partially Met   provided energy conservation education handouts 12/13              OT Long Term Goals - 01/06/21 1509       OT LONG TERM GOAL #1   Title Pt will demonstrate improvements in 9 hole peg test by 3 seconds to increase coordination and needed for handwriting and leisure pursuits.    Baseline L: 28.15 and R: 31.47    Time 8    Period Weeks    Status On-going      OT LONG TERM GOAL #2   Title Pt will improve box and blocks score by 10 on R to demonstrate improved strength and GMC as needed for ADLs and IADLs    Baseline R: 38 and L: 55   R 48 on 12/13   Time 8    Period Weeks    Status Achieved      OT LONG TERM GOAL #3   Title Pt will demonstrate increased shoulder flexion to achieve high reach to obtain items from higher surfaces as needed for ADLs/IADLs.    Baseline shoulder flexion R: 140 and L: 150    Time 8    Period Weeks     Status On-going      OT LONG TERM GOAL #4   Title Pt will report ability to style hair with increased endurance and </= to 2 rest breaks.    Time 8    Period Weeks    Status On-going                   Plan - 01/06/21 1509     Clinical Impression Statement Pt is showing improvements in legibility with use of lined paper, however continues to demonstrate smaller font as passage continues and as she fatigues. Pt with good carryover of HEP, even utilizing tweezers and opening containers with noted improvements.  Pt is receptive to education and demonstrating carryover from previous sessions and will continue to benefit from skilled OT to address impairments impacting her ability to complete ADLs, IADLs, and leisure tasks.    OT Occupational Profile and History Problem Focused Assessment - Including review of records relating to presenting problem    Occupational performance deficits (Please refer to evaluation for details): ADL's;IADL's;Leisure    Body Structure / Function / Physical Skills ADL;Body mechanics;Coordination;FMC;Endurance;Dexterity;GMC;IADL;Mobility;ROM;Proprioception;Pain;Strength;UE functional use    Rehab Potential Good    Clinical Decision Making Limited treatment options, no task modification necessary    Comorbidities Affecting Occupational Performance: May have comorbidities impacting occupational performance    Modification or Assistance to Complete Evaluation  No modification of tasks or assist necessary to complete eval    OT Frequency 2x / week    OT Duration 8 weeks    OT Treatment/Interventions Self-care/ADL training;Paraffin;Electrical Stimulation;Cryotherapy;Ultrasound;Moist Heat;Fluidtherapy;Therapeutic exercise;Neuromuscular education;Energy conservation;Manual Therapy;Functional Mobility Training;DME and/or AE instruction;Passive range of motion;Patient/family education;Therapeutic activities;Balance training;Psychosocial skills training;Coping strategies  training    Plan [facility] for handwriting and overhead reaching as needed for haircare and gardening and homemaking tasks    Consulted and Agree with Plan of Care Patient             Patient will benefit from skilled therapeutic intervention in order to improve the following deficits and impairments:   Body Structure / Function / Physical Skills: ADL, Body mechanics, Coordination, FMC, Endurance, Dexterity, GMC, IADL, Mobility, ROM, Proprioception, Pain, Strength, UE functional use       Visit Diagnosis: Other lack of coordination  Hemiplegia and hemiparesis following cerebral infarction affecting right dominant side (HCC)  Muscle weakness (generalized)    Problem List Patient Active Problem List   Diagnosis Date Noted   Adjustment disorder with depressed mood 10/28/2020   Acute CVA (cerebrovascular accident) ([place]) - with RUE > RLE weakness and expressive aphasia 10/16/2020   Underweight 10/14/2020   Stress 10/14/2020   Allergic rhinitis 10/01/2020   Lipoma 08/21/2020   Osteoporosis 08/21/2020   Femoral fracture ([place]) 09/24/2018   Left displaced femoral neck fracture ([place]) 09/23/2018   Pulmonary nodules 04/10/2014   Thyroid nodule 12/12/2013   Hemangioma of liver 12/12/2013    Simonne Come, OT 01/06/2021, 3:20 PM  [facility] [address], [place], [place], [address] Phone: [phone]   Fax:  [phone]  Name: KAMAYA KECKLER MRN: [id number] Date of Birth: 1946/01/01

## 2021-01-07 ENCOUNTER — Encounter: Payer: Self-pay | Admitting: Occupational Therapy

## 2021-01-07 ENCOUNTER — Ambulatory Visit: Payer: Medicare PPO

## 2021-01-07 ENCOUNTER — Ambulatory Visit: Payer: Medicare PPO | Admitting: Occupational Therapy

## 2021-01-07 DIAGNOSIS — R278 Other lack of coordination: Secondary | ICD-10-CM | POA: Diagnosis not present

## 2021-01-07 DIAGNOSIS — R471 Dysarthria and anarthria: Secondary | ICD-10-CM

## 2021-01-07 DIAGNOSIS — R1310 Dysphagia, unspecified: Secondary | ICD-10-CM

## 2021-01-07 DIAGNOSIS — I69351 Hemiplegia and hemiparesis following cerebral infarction affecting right dominant side: Secondary | ICD-10-CM

## 2021-01-07 DIAGNOSIS — M6281 Muscle weakness (generalized): Secondary | ICD-10-CM

## 2021-01-07 NOTE — Therapy (Signed)
[facility] [address] [place], [place], [address] Phone: [phone]   Fax:  [phone]  Speech Language Pathology Treatment  Patient Details  Name: Latoya Wright MRN: [id number] Date of Birth: Apr 04, 1945 Referring Provider (SLP): Dimas Chyle MD   Encounter Date: 01/07/2021   End of Session - 01/07/21 1356     Visit Number 6    Number of Visits 17    Date for SLP Re-Evaluation 02/06/21   extended slightly due to holidays   SLP Start Time 1319    SLP Stop Time  1353    SLP Time Calculation (min) 34 min    Activity Tolerance Patient tolerated treatment well             Past Medical History:  Diagnosis Date   Allergy    Arthritis     Past Surgical History:  Procedure Laterality Date   TOTAL HIP ARTHROPLASTY 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   Procedure: TOTAL HIP ARTHROPLASTY ANTERIOR APPROACH;  Surgeon: Mcarthur Rossetti, MD;  Location: [facility];  Service: Orthopedics;  Laterality: Left;    There were no vitals filed for this visit.          ADULT SLP TREATMENT - 01/07/21 1329       General Information   Behavior/Cognition Alert;Cooperative;Pleasant mood      Treatment Provided   Treatment provided Cognitive-Linquistic      Pain Assessment   Pain Assessment No/denies pain      Cognitive-Linquistic Treatment   Treatment focused on Dysarthria    Skilled Treatment Latoya Wright states she is still biting the inside of her cheek rarely with POs. SLP told pt to be more midful when she eats so that cheek stays out of the way during mastication. Also suggested pt chew on opposite side (left). Pt produced HEP today with SBA (one cue to press lips harder). In 12 minutes conversation about min-mod complex topics pt's intelligibility was 100%. Outdoors, Latoya Wright maintained 100% intelligbility. She stated she thinks "slow it down" and this assists her in keeping her speech clearly understood. Pt may decr to once/week next week or week  after.      Assessment / Recommendations / Plan   Plan Continue with current plan of care      Progression Toward Goals   Progression toward goals Progressing toward goals                SLP Short Term Goals - 01/07/21 1358       SLP SHORT TERM GOAL #1   Title pt will complete HEP for oral motor strengthening with rare min A over 2 sessions    Baseline 12-09-20    Status Achieved    Target Date 01/02/21      SLP SHORT TERM GOAL #2   Title pt will have no more than 3 episodes of unintelligible speech in 5 minutes simple conversation in 3 sessions, using speech ocmpensation techniques    Baseline 01-06-21    Time 4    Period Weeks    Status Partially Met   extended one week due to absences   Target Date 01/09/21      SLP SHORT TERM GOAL #3   Title pt will produce sentence responses to verbal or written stimuli with average low 70s dB in 3 sessions    Time 4    Period Weeks    Status Deferred    Target Date 01/02/21  SLP Long Term Goals - 01/07/21 1358       SLP LONG TERM GOAL #1   Title pt will have no more than 2 episodes of unintelligible speech in 10 minutes simple conversation in 3 sessions, using speech compensation techniques    Time 8    Period Weeks    Status On-going    Target Date 02/06/21      SLP LONG TERM GOAL #2   Title pt will demo oral-motor HEP independently in 3 sessions    Time 8    Period Weeks    Status On-going    Target Date 02/06/21      SLP LONG TERM GOAL #3   Title pt will indicate less need for lingual sweep during meals than prior to outpatient ST, during last 1-3 ST sessions    Time 8    Period Weeks    Status On-going    Target Date 02/06/21      SLP LONG TERM GOAL #4   Title pt will improve her PROM score taken from the first 1-2 sessions of outpatient ST    Time 8    Period Weeks    Status On-going    Target Date 02/06/21      SLP LONG TERM GOAL #5   Title pt will produce 10 minutes simple-mod  complex conversation with average low 70s dB in 3 sessions    Time 8    Period Weeks    Status On-going    Target Date 02/06/21              Plan - 01/07/21 1357     Clinical Impression Statement Pt Latoya Wright "Latoya Wright" continues to present with WNL langauge and cognitive ability but with improving mild ataxic dysarthria, 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 See "skilled intervention" for more details. Pt would beneift from continued skilled ST targeting oral-motor HEP (inlcuding increasing speech volume), and compensatory measures in order to improve her speech intelligbilty. She may decr in frequency next week or week after to once/week.    Speech Therapy Frequency 2x / week    Duration 8 weeks    Treatment/Interventions Internal/external aids;Compensatory strategies;Environmental controls;SLP instruction and feedback;Oral motor exercises;Patient/family education    Potential to Achieve Goals Good    SLP Home Exercise Plan pt to bring next session    Consulted and Agree with Plan of Care Patient             Patient will benefit from skilled therapeutic intervention in order to improve the following deficits and impairments:   Dysarthria and anarthria  Dysphagia, unspecified type    Problem List Patient Active Problem List   Diagnosis Date Noted   Adjustment disorder with depressed mood 10/28/2020   Acute CVA (cerebrovascular accident) [facility]) - with RUE > RLE weakness and expressive aphasia 10/16/2020   Underweight 10/14/2020   Stress 10/14/2020   Allergic rhinitis 10/01/2020   Lipoma 08/21/2020   Osteoporosis 08/21/2020   Femoral fracture ([place]) 09/24/2018   Left displaced femoral neck fracture ([place]) 09/23/2018   Pulmonary nodules 04/10/2014   Thyroid nodule 12/12/2013   Hemangioma of liver 12/12/2013    Latoya Wright, Latoya Wright 01/07/2021, 2:00 PM  [facility] [address] [place], [place],  [address] Phone: [phone]   Fax:  [phone]   Name: Latoya Wright MRN: [id number] Date of Birth: May 19, 1945

## 2021-01-07 NOTE — Patient Instructions (Signed)
Digit exercises:  Focus on increase use of index finger with:  - removing stones from putty  - picking up pegs, coins, buttons, etc.  - use of tweezers to pick up toothpicks  - picking up common small items  - removing tissues from tissue box    Elastic band exercises:  - finger flexion  - finger extension

## 2021-01-07 NOTE — Therapy (Signed)
[facility] [address] [place], [place], [address] Phone: [phone]   Fax:  [phone]  Occupational Therapy Treatment  Patient Details  Name: Latoya Wright MRN: [id number] Date of Birth: 1945/06/12 Referring Provider (OT): Vivi Barrack. MD   Encounter Date: 01/07/2021   OT End of Session - 01/07/21 1354     Visit Number 6    Number of Visits 17    Date for OT Re-Evaluation 01/28/21    Authorization Type [employer]    OT Start Time 1355    OT Stop Time 1444    OT Time Calculation (min) 49 min    Activity Tolerance Patient tolerated treatment well    Behavior During Therapy WFL for tasks assessed/performed             Past Medical History:  Diagnosis Date   Allergy    Arthritis     Past Surgical History:  Procedure Laterality Date   TOTAL HIP ARTHROPLASTY 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   Procedure: TOTAL HIP ARTHROPLASTY ANTERIOR APPROACH;  Surgeon: Mcarthur Rossetti, MD;  Location: [facility];  Service: Orthopedics;  Laterality: Left;    There were no vitals filed for this visit.   Subjective Assessment - 01/07/21 1400     Subjective  Pt reports practicing handwriting and reports "it is awful" and "I get so fed up".    Pertinent History HTN and HLD    Patient Stated Goals I want to write    Currently in Pain? No/denies                Handwriting practice with focus on large letters as pt with tendency to write smaller and smaller as the passage continues.  Utilized lined paper with pt writing in space of 2 lines to increase size of lettering.  Pt continues to be very frustrated with decreased legibility and progressively smaller font.  Therapist reiterated practice but also taking a break between trials or even words to increase legibility.  [facility] with small peg board pattern replication.  Pt dropping 10% of pegs, also noted pt with frequent use of thumb and 3rd digit when picking up pegs and decreased use of  2nd digit.  Therapist directed pt in removing pegs with only thumb and 2nd digit to isolate 2nd digit movement as needed for handwriting.    Index finger flexion/extension against resistance of elastic band with focus on isolation of index finger mobility as pt with decreased sustained use during fine motor tasks.  Utilized built up tweezers to pick up toothpicks with focus on increased strength and control of index finger to manage tweezers. Therapist educated on importance of incorporation of and use of index finger in [facility] tasks and handwriting.                      OT Short Term Goals - 01/06/21 1413       OT SHORT TERM GOAL #1   Title Pt will be Independent in HEP with focus on [facility] and strengthening as needed for IADLs/leisure pursuits.    Time 4    Period Weeks    Status Achieved    Target Date 12/31/20      OT SHORT TERM GOAL #2   Title Pt will verbalize 3 strategies to increase endurance during home making and/or gardening tasks.    Time 4    Period Weeks    Status Partially Met   provided energy  conservation education handouts 12/13              OT Long Term Goals - 01/06/21 1509       OT LONG TERM GOAL #1   Title Pt will demonstrate improvements in 9 hole peg test by 3 seconds to increase coordination and needed for handwriting and leisure pursuits.    Baseline L: 28.15 and R: 31.47    Time 8    Period Weeks    Status On-going      OT LONG TERM GOAL #2   Title Pt will improve box and blocks score by 10 on R to demonstrate improved strength and GMC as needed for ADLs and IADLs    Baseline R: 38 and L: 55   R 48 on 12/13   Time 8    Period Weeks    Status Achieved      OT LONG TERM GOAL #3   Title Pt will demonstrate increased shoulder flexion to achieve high reach to obtain items from higher surfaces as needed for ADLs/IADLs.    Baseline shoulder flexion R: 140 and L: 150    Time 8    Period Weeks    Status On-going      OT LONG TERM GOAL #4    Title Pt will report ability to style hair with increased endurance and </= to 2 rest breaks.    Time 8    Period Weeks    Status On-going                   Plan - 01/07/21 1355     Clinical Impression Statement Pt is showing improvements in legibility with use of lined paper, however continues to demonstrate smaller font as passage continues and as she fatigues. Pt continues to voice frustration with small size of writing and decreased legibility.  Pt with good carryover of HEP, even utilizing tweezers to pick up small items and completing home worksheets.  Pt is receptive to education and demonstrating carryover from previous sessions and will continue to benefit from skilled OT to address impairments impacting her ability to complete ADLs, IADLs, and leisure tasks.    OT Occupational Profile and History Problem Focused Assessment - Including review of records relating to presenting problem    Occupational performance deficits (Please refer to evaluation for details): ADL's;IADL's;Leisure    Body Structure / Function / Physical Skills ADL;Body mechanics;Coordination;FMC;Endurance;Dexterity;GMC;IADL;Mobility;ROM;Proprioception;Pain;Strength;UE functional use    Rehab Potential Good    Clinical Decision Making Limited treatment options, no task modification necessary    Comorbidities Affecting Occupational Performance: May have comorbidities impacting occupational performance    Modification or Assistance to Complete Evaluation  No modification of tasks or assist necessary to complete eval    OT Frequency 2x / week    OT Duration 8 weeks    OT Treatment/Interventions Self-care/ADL training;Paraffin;Electrical Stimulation;Cryotherapy;Ultrasound;Moist Heat;Fluidtherapy;Therapeutic exercise;Neuromuscular education;Energy conservation;Manual Therapy;Functional Mobility Training;DME and/or AE instruction;Passive range of motion;Patient/family education;Therapeutic activities;Balance  training;Psychosocial skills training;Coping strategies training    Plan [facility] for handwriting, especially index finger strength and coordination    OT Home Exercise Plan index finger exercises    Consulted and Agree with Plan of Care Patient             Patient will benefit from skilled therapeutic intervention in order to improve the following deficits and impairments:   Body Structure / Function / Physical Skills: ADL, Body mechanics, Coordination, FMC, Endurance, Dexterity, GMC, IADL, Mobility, ROM, Proprioception, Pain, Strength, UE  functional use       Visit Diagnosis: Other lack of coordination  Hemiplegia and hemiparesis following cerebral infarction affecting right dominant side (HCC)  Muscle weakness (generalized)    Problem List Patient Active Problem List   Diagnosis Date Noted   Adjustment disorder with depressed mood 10/28/2020   Acute CVA (cerebrovascular accident) ([place]) - with RUE > RLE weakness and expressive aphasia 10/16/2020   Underweight 10/14/2020   Stress 10/14/2020   Allergic rhinitis 10/01/2020   Lipoma 08/21/2020   Osteoporosis 08/21/2020   Femoral fracture ([place]) 09/24/2018   Left displaced femoral neck fracture ([place]) 09/23/2018   Pulmonary nodules 04/10/2014   Thyroid nodule 12/12/2013   Hemangioma of liver 12/12/2013    Simonne Come, OT 01/07/2021, 4:49 PM  [facility] [address], [place], [place], [address] Phone: [phone]   Fax:  [phone]  Name: TRAN RANDLE MRN: [id number] Date of Birth: 1945-11-06

## 2021-01-08 ENCOUNTER — Other Ambulatory Visit: Payer: Medicare PPO

## 2021-01-12 ENCOUNTER — Other Ambulatory Visit: Payer: Self-pay

## 2021-01-12 ENCOUNTER — Encounter: Payer: Self-pay | Admitting: Occupational Therapy

## 2021-01-12 ENCOUNTER — Ambulatory Visit: Payer: Medicare PPO | Admitting: Occupational Therapy

## 2021-01-12 ENCOUNTER — Ambulatory Visit: Payer: Medicare PPO

## 2021-01-12 ENCOUNTER — Telehealth: Payer: Self-pay

## 2021-01-12 DIAGNOSIS — R1310 Dysphagia, unspecified: Secondary | ICD-10-CM

## 2021-01-12 DIAGNOSIS — R278 Other lack of coordination: Secondary | ICD-10-CM

## 2021-01-12 DIAGNOSIS — M6281 Muscle weakness (generalized): Secondary | ICD-10-CM

## 2021-01-12 DIAGNOSIS — R471 Dysarthria and anarthria: Secondary | ICD-10-CM

## 2021-01-12 DIAGNOSIS — I69351 Hemiplegia and hemiparesis following cerebral infarction affecting right dominant side: Secondary | ICD-10-CM

## 2021-01-12 NOTE — Telephone Encounter (Signed)
Patient would like to schedule her Prolia injection. She advised that she has her authorization.

## 2021-01-12 NOTE — Therapy (Signed)
[facility] [address] [place], [place], [address] Phone: [phone]   Fax:  [phone]  Occupational Therapy Treatment  Patient Details  Name: Latoya Wright MRN: [id number] Date of Birth: 09/30/1945 Referring Provider (OT): Vivi Barrack. MD   Encounter Date: 01/12/2021   OT End of Session - 01/12/21 1204     Visit Number 7    Number of Visits 17    Date for OT Re-Evaluation 01/28/21    Authorization Type [employer]    OT Start Time 1152    OT Stop Time 1231    OT Time Calculation (min) 39 min    Activity Tolerance Patient tolerated treatment well    Behavior During Therapy WFL for tasks assessed/performed             Past Medical History:  Diagnosis Date   Allergy    Arthritis     Past Surgical History:  Procedure Laterality Date   TOTAL HIP ARTHROPLASTY 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   Procedure: TOTAL HIP ARTHROPLASTY ANTERIOR APPROACH;  Surgeon: Mcarthur Rossetti, MD;  Location: [facility];  Service: Orthopedics;  Laterality: Left;    There were no vitals filed for this visit.   Subjective Assessment - 01/12/21 1201     Subjective  Pt reports use of clothespins on clothing hanger for finger strengthening and focus on incresaed tip to tip with thumb and index finger.    Pertinent History HTN and HLD    Patient Stated Goals I want to write    Currently in Pain? No/denies                  [facility] with focus on coordination and forced use of index finger while placing and removing pegs from resistive peg board.  Increased challenge to rotating pegs with isolated use of index finger.  Pt requiring demonstration cues for technique with rotation and pressure when placing and removing pegs.  Demonstrated technique with pen, encouraging pt to resume pen rotation tasks at home.  Resistive clothespins (4# and 6#) with tip to tip and 3 jaw chuck with focus on maintaining contact of index finger during 3 jaw chuck  pinch as pt demonstrating less contact with surface when utilizing index and long finger to complete task.    Stacking legos with focus on incorporation of index finger when putting legos togther and 3 jaw chuck with removing.  Modified task to stacking on staggered pattern to increase ability to pinch and remove.  Educated on use of legos as resistance from a strengthening standpoint.                     OT Short Term Goals - 01/06/21 1413       OT SHORT TERM GOAL #1   Title Pt will be Independent in HEP with focus on [facility] and strengthening as needed for IADLs/leisure pursuits.    Time 4    Period Weeks    Status Achieved    Target Date 12/31/20      OT SHORT TERM GOAL #2   Title Pt will verbalize 3 strategies to increase endurance during home making and/or gardening tasks.    Time 4    Period Weeks    Status Partially Met   provided energy conservation education handouts 12/13              OT Long Term Goals - 01/06/21 [id number]  OT LONG TERM GOAL #1   Title Pt will demonstrate improvements in 9 hole peg test by 3 seconds to increase coordination and needed for handwriting and leisure pursuits.    Baseline L: 28.15 and R: 31.47    Time 8    Period Weeks    Status On-going      OT LONG TERM GOAL #2   Title Pt will improve box and blocks score by 10 on R to demonstrate improved strength and GMC as needed for ADLs and IADLs    Baseline R: 38 and L: 55   R 48 on 12/13   Time 8    Period Weeks    Status Achieved      OT LONG TERM GOAL #3   Title Pt will demonstrate increased shoulder flexion to achieve high reach to obtain items from higher surfaces as needed for ADLs/IADLs.    Baseline shoulder flexion R: 140 and L: 150    Time 8    Period Weeks    Status On-going      OT LONG TERM GOAL #4   Title Pt will report ability to style hair with increased endurance and </= to 2 rest breaks.    Time 8    Period Weeks    Status On-going                    Plan - 01/12/21 1205     Clinical Impression Statement Pt continues to voice frustration with small size of writing and decreased legibility.  Pt with good carryover of HEP, even creating additional exercise routine with home items to increase pinch strength and integration of index finger.  Pt is receptive to education and demonstrating carryover from previous sessions and will continue to benefit from skilled OT to address pt goals.    OT Occupational Profile and History Problem Focused Assessment - Including review of records relating to presenting problem    Occupational performance deficits (Please refer to evaluation for details): ADL's;IADL's;Leisure    Body Structure / Function / Physical Skills ADL;Body mechanics;Coordination;FMC;Endurance;Dexterity;GMC;IADL;Mobility;ROM;Proprioception;Pain;Strength;UE functional use    Rehab Potential Good    Clinical Decision Making Limited treatment options, no task modification necessary    Comorbidities Affecting Occupational Performance: May have comorbidities impacting occupational performance    Modification or Assistance to Complete Evaluation  No modification of tasks or assist necessary to complete eval    OT Frequency 2x / week    OT Duration 8 weeks    OT Treatment/Interventions Self-care/ADL training;Paraffin;Electrical Stimulation;Cryotherapy;Ultrasound;Moist Heat;Fluidtherapy;Therapeutic exercise;Neuromuscular education;Energy conservation;Manual Therapy;Functional Mobility Training;DME and/or AE instruction;Passive range of motion;Patient/family education;Therapeutic activities;Balance training;Psychosocial skills training;Coping strategies training    Plan [facility] for handwriting, especially index finger strength and coordination, grading pressure with pinch and coordination    Consulted and Agree with Plan of Care Patient             Patient will benefit from skilled therapeutic intervention in order to improve the  following deficits and impairments:   Body Structure / Function / Physical Skills: ADL, Body mechanics, Coordination, FMC, Endurance, Dexterity, GMC, IADL, Mobility, ROM, Proprioception, Pain, Strength, UE functional use       Visit Diagnosis: Other lack of coordination  Hemiplegia and hemiparesis following cerebral infarction affecting right dominant side (HCC)  Muscle weakness (generalized)    Problem List Patient Active Problem List   Diagnosis Date Noted   Adjustment disorder with depressed mood 10/28/2020   Acute CVA (cerebrovascular accident) ([place]) - with RUE >  RLE weakness and expressive aphasia 10/16/2020   Underweight 10/14/2020   Stress 10/14/2020   Allergic rhinitis 10/01/2020   Lipoma 08/21/2020   Osteoporosis 08/21/2020   Femoral fracture ([place]) 09/24/2018   Left displaced femoral neck fracture ([place]) 09/23/2018   Pulmonary nodules 04/10/2014   Thyroid nodule 12/12/2013   Hemangioma of liver 12/12/2013    Simonne Come, OT 01/12/2021, 3:30 PM  [facility] [address], [place], [place], [address] Phone: [phone]   Fax:  [phone]  Name: Latoya Wright MRN: [id number] Date of Birth: 05/23/1945

## 2021-01-12 NOTE — Therapy (Signed)
[facility] [address], [place], [place], [address] Phone: [phone]   Fax:  [phone]  Speech Language Pathology Treatment  Patient Details  Name: Latoya Wright MRN: [id number] Date of Birth: 09/06/1945 Referring Provider (SLP): Dimas Chyle MD   Encounter Date: 01/12/2021   End of Session - 01/12/21 1758     Visit Number 7    Number of Visits 17    Date for SLP Re-Evaluation 02/06/21   extended slightly due to holidays   SLP Start Time 1103    SLP Stop Time  1145    SLP Time Calculation (min) 42 min    Activity Tolerance Patient tolerated treatment well             Past Medical History:  Diagnosis Date   Allergy    Arthritis     Past Surgical History:  Procedure Laterality Date   TOTAL HIP ARTHROPLASTY 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   Procedure: TOTAL HIP ARTHROPLASTY ANTERIOR APPROACH;  Surgeon: Mcarthur Rossetti, MD;  Location: [facility];  Service: Orthopedics;  Laterality: Left;    There were no vitals filed for this visit.   Subjective Assessment - 01/12/21 1106     Subjective Pt brought exercises with her today.    Currently in Pain? No/denies                   ADULT SLP TREATMENT - 01/12/21 1115       General Information   Behavior/Cognition Alert;Cooperative;Pleasant mood      Treatment Provided   Treatment provided Cognitive-Linquistic      Cognitive-Linquistic Treatment   Treatment focused on Dysarthria    Skilled Treatment Pt does not think frequency of biting her rt cheek/lip has decr'd since last time. SLP told her to cont to do most chewing on lt side. She performed HEP today with modified independence with one addition - do tongue circles in opposite direction (clockwise). Pt maintained slower speech rate with 100% intelligibility in ST room for 10 minutes and outdoors maintained same intelligibility rate in 10 minutes of mod complex conversation. her [employer] conversations have resulted  in no requests for repeats "...and those ladies would let me know," pt stated to SLP.      Assessment / Recommendations / Plan   Plan Other (Comment)   decr to once/week     Progression Toward Goals   Progression toward goals Progressing toward goals                SLP Short Term Goals - 01/07/21 1358       SLP SHORT TERM GOAL #1   Title pt will complete HEP for oral motor strengthening with rare min A over 2 sessions    Baseline 12-09-20    Status Achieved    Target Date 01/02/21      SLP SHORT TERM GOAL #2   Title pt will have no more than 3 episodes of unintelligible speech in 5 minutes simple conversation in 3 sessions, using speech ocmpensation techniques    Baseline 01-06-21    Time 4    Period Weeks    Status Partially Met   extended one week due to absences   Target Date 01/09/21      SLP SHORT TERM GOAL #3   Title pt will produce sentence responses to verbal or written stimuli with average low 70s dB in 3 sessions    Time 4    Period Weeks  Status Deferred    Target Date 01/02/21              SLP Long Term Goals - 01/12/21 1759       SLP LONG TERM GOAL #1   Title pt will have no more than 2 episodes of unintelligible speech in 10 minutes simple conversation in 3 sessions, using speech compensation techniques    Time 8    Period Weeks    Status On-going    Target Date 02/06/21      SLP LONG TERM GOAL #2   Title pt will demo oral-motor HEP independently in 3 sessions    Time 8    Period Weeks    Status On-going    Target Date 02/06/21      SLP LONG TERM GOAL #3   Title pt will indicate less need for lingual sweep during meals than prior to outpatient ST, during last 1-3 ST sessions    Time 8    Period Weeks    Status On-going    Target Date 02/06/21      SLP LONG TERM GOAL #4   Title pt will improve her PROM score taken from the first 1-2 sessions of outpatient ST    Time 8    Period Weeks    Status On-going    Target Date 02/06/21       SLP LONG TERM GOAL #5   Title pt will produce 10 minutes simple-mod complex conversation with average low 70s dB in 3 sessions    Time 8    Period Weeks    Status On-going    Target Date 02/06/21              Plan - 01/12/21 1758     Clinical Impression Statement Pt Latoya Wright "Latoya Wright" continues to present with WNL langauge and cognitive ability but with improving mild ataxic dysarthria, 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 See "skilled intervention" for more details. Pt would beneift from continued skilled ST targeting oral-motor HEP (inlcuding increasing speech volume), and compensatory measures in order to improve her speech intelligbilty. Pt agreed a decr in frequency to once/week was warranted at this time.    Speech Therapy Frequency 1x /week    Duration 8 weeks    Treatment/Interventions Internal/external aids;Compensatory strategies;Environmental controls;SLP instruction and feedback;Oral motor exercises;Patient/family education    Potential to Achieve Goals Good    SLP Home Exercise Plan pt to bring next session    Consulted and Agree with Plan of Care Patient             Patient will benefit from skilled therapeutic intervention in order to improve the following deficits and impairments:   Dysarthria and anarthria  Dysphagia, unspecified type    Problem List Patient Active Problem List   Diagnosis Date Noted   Adjustment disorder with depressed mood 10/28/2020   Acute CVA (cerebrovascular accident) [facility]) - with RUE > RLE weakness and expressive aphasia 10/16/2020   Underweight 10/14/2020   Stress 10/14/2020   Allergic rhinitis 10/01/2020   Lipoma 08/21/2020   Osteoporosis 08/21/2020   Femoral fracture ([place]) 09/24/2018   Left displaced femoral neck fracture ([place]) 09/23/2018   Pulmonary nodules 04/10/2014   Thyroid nodule 12/12/2013   Hemangioma of liver 12/12/2013    Torria Fromer, CCC-SLP 01/12/2021, 6:02 PM  [facility] [address] [place], [place], [address] Phone: [phone]   Fax:  [phone]  Name: Latoya Wright MRN: [id number] Date of Birth: December 25, 1945

## 2021-01-13 NOTE — Telephone Encounter (Signed)
Left detailed message,patient is not due for a prolia injection until Feb2023 it is to soon to receive.

## 2021-01-13 NOTE — Telephone Encounter (Signed)
Patient has called back in regard.  Patient had authorization for first prolia.  I have advised that we would have to get an additional authorization for this prolia.    Did advise that it could be after first 2 weeks in January.   Please follow back up with patient in regard.

## 2021-01-14 ENCOUNTER — Encounter: Payer: Self-pay | Admitting: Occupational Therapy

## 2021-01-14 ENCOUNTER — Ambulatory Visit: Payer: Medicare PPO | Admitting: Occupational Therapy

## 2021-01-14 ENCOUNTER — Other Ambulatory Visit: Payer: Self-pay

## 2021-01-14 DIAGNOSIS — M6281 Muscle weakness (generalized): Secondary | ICD-10-CM

## 2021-01-14 DIAGNOSIS — R278 Other lack of coordination: Secondary | ICD-10-CM | POA: Diagnosis not present

## 2021-01-14 DIAGNOSIS — I69351 Hemiplegia and hemiparesis following cerebral infarction affecting right dominant side: Secondary | ICD-10-CM

## 2021-01-14 NOTE — Therapy (Signed)
[facility] [address], [place], [place], [address] Phone: [phone]   Fax:  [phone]  Occupational Therapy Treatment  Patient Details  Name: Latoya Wright MRN: [id number] Date of Birth: 07/29/1945 Referring Provider (OT): Vivi Barrack. MD   Encounter Date: 01/14/2021   OT End of Session - 01/14/21 0849     Visit Number 8    Number of Visits 17    Date for OT Re-Evaluation 01/28/21    Authorization Type [employer]    OT Start Time 0845    OT Stop Time 0927    OT Time Calculation (min) 42 min    Activity Tolerance Patient tolerated treatment well    Behavior During Therapy WFL for tasks assessed/performed             Past Medical History:  Diagnosis Date   Allergy    Arthritis     Past Surgical History:  Procedure Laterality Date   TOTAL HIP ARTHROPLASTY 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   Procedure: TOTAL HIP ARTHROPLASTY ANTERIOR APPROACH;  Surgeon: Mcarthur Rossetti, MD;  Location: [facility];  Service: Orthopedics;  Laterality: Left;    There were no vitals filed for this visit.   Subjective Assessment - 01/14/21 0846     Subjective  Pt reports going out of town this afternoon.    Pertinent History HTN and HLD    Patient Stated Goals I want to write    Currently in Pain? No/denies               [facility] tasks with focus on intrinsic hand/finger movements with ball rotation and tossing ball.  Pt with increased arc when tossing with L compared to R.  Engaged in opening water bottle and pouring water with focus on strength and motor control.  Pt reports diffiuclty with opening [employer] milks due to seal with difficulty pinching and pulling seal off bottle. Reports difficulty with whipping potatoes, due to decreased wrist and finger coordination.  Engaged in simulated stirring activity with cup of water and spoon.   Grading pressure activity with picking up foam cup with focus on grading pressure and rotation of  cup.  Picking up soft jacks with focus on grading pressure.  Pt with good soft pressure with picking up soft items.  Reiterated grading pressure with Henry tasks to allow more fluidity with stirring and handwriting.  Green with reaching utilizing resistive clothes pins reaching overhead to challenge shoulder stability while strengthening tip to tip and 3 jaw chuck.  Reiterated pinch and rotation to make small putty balls with intrinsic hand movements.  Stirring cup with spoon has similar movements as needed for handwriting.                      OT Short Term Goals - 01/06/21 1413       OT SHORT TERM GOAL #1   Title Pt will be Independent in HEP with focus on [facility] and strengthening as needed for IADLs/leisure pursuits.    Time 4    Period Weeks    Status Achieved    Target Date 12/31/20      OT SHORT TERM GOAL #2   Title Pt will verbalize 3 strategies to increase endurance during home making and/or gardening tasks.    Time 4    Period Weeks    Status Partially Met   provided energy conservation education handouts 12/13  OT Long Term Goals - 01/06/21 1509       OT LONG TERM GOAL #1   Title Pt will demonstrate improvements in 9 hole peg test by 3 seconds to increase coordination and needed for handwriting and leisure pursuits.    Baseline L: 28.15 and R: 31.47    Time 8    Period Weeks    Status On-going      OT LONG TERM GOAL #2   Title Pt will improve box and blocks score by 10 on R to demonstrate improved strength and GMC as needed for ADLs and IADLs    Baseline R: 38 and L: 55   R 48 on 12/13   Time 8    Period Weeks    Status Achieved      OT LONG TERM GOAL #3   Title Pt will demonstrate increased shoulder flexion to achieve high reach to obtain items from higher surfaces as needed for ADLs/IADLs.    Baseline shoulder flexion R: 140 and L: 150    Time 8    Period Weeks    Status On-going      OT LONG TERM GOAL #4   Title Pt will report  ability to style hair with increased endurance and </= to 2 rest breaks.    Time 8    Period Weeks    Status On-going                   Plan - 01/14/21 0849     Clinical Impression Statement Pt continues to voice frustration with small size of writing and decreased legibility.  Pt demonstrating fatigue in shoulder and intrinsic hand movemenets when whipping potatoes, therefore engaged in tasks to simulate this movement.  Educated on similar intrinsic movements to handwriting tasks. Pt is receptive to education and demonstrating carryover from previous sessions and is showing mild improvements with size and legibility with handwriting when slowing down and taking breaks.    OT Occupational Profile and History Problem Focused Assessment - Including review of records relating to presenting problem    Occupational performance deficits (Please refer to evaluation for details): ADL's;IADL's;Leisure    Body Structure / Function / Physical Skills ADL;Body mechanics;Coordination;FMC;Endurance;Dexterity;GMC;IADL;Mobility;ROM;Proprioception;Pain;Strength;UE functional use    Rehab Potential Good    Clinical Decision Making Limited treatment options, no task modification necessary    Comorbidities Affecting Occupational Performance: May have comorbidities impacting occupational performance    Modification or Assistance to Complete Evaluation  No modification of tasks or assist necessary to complete eval    OT Frequency 2x / week    OT Duration 8 weeks    OT Treatment/Interventions Self-care/ADL training;Paraffin;Electrical Stimulation;Cryotherapy;Ultrasound;Moist Heat;Fluidtherapy;Therapeutic exercise;Neuromuscular education;Energy conservation;Manual Therapy;Functional Mobility Training;DME and/or AE instruction;Passive range of motion;Patient/family education;Therapeutic activities;Balance training;Psychosocial skills training;Coping strategies training    Plan [facility] for handwriting, especially  index finger strength and coordination, grading pressure with pinch and coordination    Consulted and Agree with Plan of Care Patient             Patient will benefit from skilled therapeutic intervention in order to improve the following deficits and impairments:   Body Structure / Function / Physical Skills: ADL, Body mechanics, Coordination, FMC, Endurance, Dexterity, GMC, IADL, Mobility, ROM, Proprioception, Pain, Strength, UE functional use       Visit Diagnosis: Other lack of coordination  Hemiplegia and hemiparesis following cerebral infarction affecting right dominant side (HCC)  Muscle weakness (generalized)    Problem List Patient Active Problem List  Diagnosis Date Noted   Adjustment disorder with depressed mood 10/28/2020   Acute CVA (cerebrovascular accident) [facility]) - with RUE > RLE weakness and expressive aphasia 10/16/2020   Underweight 10/14/2020   Stress 10/14/2020   Allergic rhinitis 10/01/2020   Lipoma 08/21/2020   Osteoporosis 08/21/2020   Femoral fracture ([place]) 09/24/2018   Left displaced femoral neck fracture ([place]) 09/23/2018   Pulmonary nodules 04/10/2014   Thyroid nodule 12/12/2013   Hemangioma of liver 12/12/2013    Simonne Come, OT 01/14/2021, 9:41 AM  [facility] [address] [place], [place], [address] Phone: [phone]   Fax:  [phone]  Name: BRYNLI OLLIS MRN: [id number] Date of Birth: 11/11/1945

## 2021-01-20 ENCOUNTER — Other Ambulatory Visit: Payer: Self-pay

## 2021-01-20 ENCOUNTER — Encounter: Payer: Self-pay | Admitting: Occupational Therapy

## 2021-01-20 ENCOUNTER — Ambulatory Visit: Payer: Medicare PPO | Admitting: Occupational Therapy

## 2021-01-20 DIAGNOSIS — I69351 Hemiplegia and hemiparesis following cerebral infarction affecting right dominant side: Secondary | ICD-10-CM

## 2021-01-20 DIAGNOSIS — R278 Other lack of coordination: Secondary | ICD-10-CM | POA: Diagnosis not present

## 2021-01-20 DIAGNOSIS — M6281 Muscle weakness (generalized): Secondary | ICD-10-CM

## 2021-01-20 NOTE — Therapy (Signed)
[facility] [address], [place], [place], [address] Phone: [phone]   Fax:  [phone]  Occupational Therapy Treatment  Patient Details  Name: Latoya Wright MRN: [id number] Date of Birth: Jun 30, 1945 Referring Provider (OT): Vivi Barrack. MD   Encounter Date: 01/20/2021   OT End of Session - 01/20/21 1325     Visit Number 9    Number of Visits 17    Date for OT Re-Evaluation 01/28/21    Authorization Type [employer]    OT Start Time 1322    OT Stop Time 1402    OT Time Calculation (min) 40 min    Activity Tolerance Patient tolerated treatment well    Behavior During Therapy WFL for tasks assessed/performed             Past Medical History:  Diagnosis Date   Allergy    Arthritis     Past Surgical History:  Procedure Laterality Date   TOTAL HIP ARTHROPLASTY 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   Procedure: TOTAL HIP ARTHROPLASTY ANTERIOR APPROACH;  Surgeon: Mcarthur Rossetti, MD;  Location: [facility];  Service: Orthopedics;  Laterality: Left;    There were no vitals filed for this visit.   Subjective Assessment - 01/20/21 1324     Subjective  Pt reports doing HEP and resistive clothespins/clothes hanger exercises    Pertinent History HTN and HLD    Patient Stated Goals I want to write    Currently in Pain? No/denies               Engaged in education regarding [facility] tasks with gardening, meal prep, other household tasks to carry over to coordination and strengthening as needed for handwriting.  BUE strengthening with 3# weights with focus on shoulder ROM, strengthening, and stability.  Pt reports mild pain in L scapular region with overhead adduction task with weight, no pain without weight.  Educated on focus on quality of movement and reduced weight to no weight if movement in suffering.     Access Code: [email or web address] URL: [email or web address] Date: 01/20/2021 Prepared by: [facility]  Exercises Standing Full Range Shoulder Flexion with Dumbbells - 2 x daily - 7 x weekly - 2 sets - 10 reps Standing Bicep Curl with Dumbbells - 2 x daily - 7 x weekly - 3 sets - 10 reps Standing Shoulder Abduction with Dumbbells - 2 x daily - 7 x weekly - 3 sets - 10 reps Shoulder Overhead Press in Flexion with Dumbbells - 2 x daily - 7 x weekly - 3 sets - 10 reps Standing Bent Over Triceps Extension - 2 x daily - 7 x weekly - 3 sets - 10 reps Standing Shoulder External Rotation with Dumbbell - 2 x daily - 7 x weekly - 3 sets - 10 reps                  OT Short Term Goals - 01/06/21 1413       OT SHORT TERM GOAL #1   Title Pt will be Independent in HEP with focus on [facility] and strengthening as needed for IADLs/leisure pursuits.    Time 4    Period Weeks    Status Achieved    Target Date 12/31/20      OT SHORT TERM GOAL #2   Title Pt will verbalize 3 strategies to increase endurance during home making and/or gardening tasks.    Time 4  Period Weeks    Status Partially Met   provided energy conservation education handouts 12/13              OT Long Term Goals - 01/06/21 1509       OT LONG TERM GOAL #1   Title Pt will demonstrate improvements in 9 hole peg test by 3 seconds to increase coordination and needed for handwriting and leisure pursuits.    Baseline L: 28.15 and R: 31.47    Time 8    Period Weeks    Status On-going      OT LONG TERM GOAL #2   Title Pt will improve box and blocks score by 10 on R to demonstrate improved strength and GMC as needed for ADLs and IADLs    Baseline R: 38 and L: 55   R 48 on 12/13   Time 8    Period Weeks    Status Achieved      OT LONG TERM GOAL #3   Title Pt will demonstrate increased shoulder flexion to achieve high reach to obtain items from higher surfaces as needed for ADLs/IADLs.    Baseline shoulder flexion R: 140 and L: 150    Time 8    Period Weeks    Status On-going      OT LONG  TERM GOAL #4   Title Pt will report ability to style hair with increased endurance and </= to 2 rest breaks.    Time 8    Period Weeks    Status On-going                   Plan - 01/20/21 1504     Clinical Impression Statement Pt continues to voice frustration with small size of writing and decreased legibility, asking if it will ever improve. Educated on typically recovery timeline post stroke.  Pt demonstrating fatigue in shoulder and assymetrical movements with reaching and structured ROM exercises.  Therapist provided pt with HEP for shoulder stregthening and stability and educated on focus on symmetrical movements. Pt able to demonstrate understanding of each exercise.    OT Occupational Profile and History Problem Focused Assessment - Including review of records relating to presenting problem    Occupational performance deficits (Please refer to evaluation for details): ADL's;IADL's;Leisure    Body Structure / Function / Physical Skills ADL;Body mechanics;Coordination;FMC;Endurance;Dexterity;GMC;IADL;Mobility;ROM;Proprioception;Pain;Strength;UE functional use    Rehab Potential Good    Clinical Decision Making Limited treatment options, no task modification necessary    Comorbidities Affecting Occupational Performance: May have comorbidities impacting occupational performance    Modification or Assistance to Complete Evaluation  No modification of tasks or assist necessary to complete eval    OT Frequency 2x / week    OT Duration 8 weeks    OT Treatment/Interventions Self-care/ADL training;Paraffin;Electrical Stimulation;Cryotherapy;Ultrasound;Moist Heat;Fluidtherapy;Therapeutic exercise;Neuromuscular education;Energy conservation;Manual Therapy;Functional Mobility Training;DME and/or AE instruction;Passive range of motion;Patient/family education;Therapeutic activities;Balance training;Psychosocial skills training;Coping strategies training    Plan shoulder strength and  stability, overhead reaching    OT Home Exercise Plan shoulder strength/stability with 3 # weights  - [email or web address]    Consulted and Agree with Plan of Care Patient             Patient will benefit from skilled therapeutic intervention in order to improve the following deficits and impairments:   Body Structure / Function / Physical Skills: ADL, Body mechanics, Coordination, FMC, Endurance, Dexterity, GMC, IADL, Mobility, ROM, Proprioception, Pain, Strength, UE functional use  Visit Diagnosis: Other lack of coordination  Hemiplegia and hemiparesis following cerebral infarction affecting right dominant side (HCC)  Muscle weakness (generalized)    Problem List Patient Active Problem List   Diagnosis Date Noted   Adjustment disorder with depressed mood 10/28/2020   Acute CVA (cerebrovascular accident) ([place]) - with RUE > RLE weakness and expressive aphasia 10/16/2020   Underweight 10/14/2020   Stress 10/14/2020   Allergic rhinitis 10/01/2020   Lipoma 08/21/2020   Osteoporosis 08/21/2020   Femoral fracture ([place]) 09/24/2018   Left displaced femoral neck fracture ([place]) 09/23/2018   Pulmonary nodules 04/10/2014   Thyroid nodule 12/12/2013   Hemangioma of liver 12/12/2013    Simonne Come, OT 01/20/2021, 3:10 PM  [facility] [address] [place], [place], [address] Phone: [phone]   Fax:  [phone]  Name: Latoya Wright MRN: [id number] Date of Birth: 01-24-1946

## 2021-01-22 ENCOUNTER — Encounter: Payer: Self-pay | Admitting: Occupational Therapy

## 2021-01-22 ENCOUNTER — Other Ambulatory Visit: Payer: Self-pay

## 2021-01-22 ENCOUNTER — Ambulatory Visit: Payer: Medicare PPO | Admitting: Occupational Therapy

## 2021-01-22 ENCOUNTER — Ambulatory Visit: Payer: Medicare PPO

## 2021-01-22 DIAGNOSIS — R471 Dysarthria and anarthria: Secondary | ICD-10-CM

## 2021-01-22 DIAGNOSIS — M6281 Muscle weakness (generalized): Secondary | ICD-10-CM

## 2021-01-22 DIAGNOSIS — I69351 Hemiplegia and hemiparesis following cerebral infarction affecting right dominant side: Secondary | ICD-10-CM

## 2021-01-22 DIAGNOSIS — R278 Other lack of coordination: Secondary | ICD-10-CM | POA: Diagnosis not present

## 2021-01-22 DIAGNOSIS — R1310 Dysphagia, unspecified: Secondary | ICD-10-CM

## 2021-01-22 NOTE — Therapy (Signed)
[facility] [address], [place], [place], [address] Phone: [phone]   Fax:  [phone]  Occupational Therapy Treatment  Patient Details  Name: Latoya Wright MRN: [id number] Date of Birth: 1945-06-06 Referring Provider (OT): Vivi Barrack. MD   Encounter Date: 01/22/2021   OT End of Session - 01/22/21 1408     Visit Number 10    Number of Visits 17    Date for OT Re-Evaluation 01/28/21    Authorization Type [employer]    OT Start Time 1402    OT Stop Time 1446    OT Time Calculation (min) 44 min    Activity Tolerance Patient tolerated treatment well    Behavior During Therapy WFL for tasks assessed/performed             Past Medical History:  Diagnosis Date   Allergy    Arthritis     Past Surgical History:  Procedure Laterality Date   TOTAL HIP ARTHROPLASTY 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   Procedure: TOTAL HIP ARTHROPLASTY ANTERIOR APPROACH;  Surgeon: Mcarthur Rossetti, MD;  Location: [facility];  Service: Orthopedics;  Laterality: Left;    There were no vitals filed for this visit.   Subjective Assessment - 01/22/21 1405     Subjective  Pt brought a check in to pay for appointments and wanted to show to therapist to assess her handwriting.    Pertinent History HTN and HLD    Patient Stated Goals I want to write    Currently in Pain? No/denies              [facility] with focus on Handwriting 2 sentences.  Pt started out with improved legibility, however decreases in size and legibility as she fatigues.  Discsussed use of print vs cursive as pt continues to demonstrated increased legibility with print.  Engaged in [place] with theraband (red) with focus on shoulder ROM, strength, and stability.  Pt reports no pain with overhead reaching with resistance band.  Therapist reiterating proper body mechanics and technique, especially with symmetrical movements R/L UE.  Provided pt with printed HEP   Access  Code: [email or web address] URL: [email or web address] Date: 01/22/2021 Prepared by: [facility]  Exercises Seated Shoulder Front Raise with Resistance - 1 x daily - 5 x weekly - 2 sets - 10 reps Seated Bilateral Arm Shoulder Row with Resistance Anchored at Feet - 1 x daily - 5 x weekly - 2 sets - 10 reps Shoulder External Rotation and Scapular Retraction with Resistance - 1 x daily - 5 x weekly - 2 sets - 10 reps Standing Shoulder Single Arm PNF D2 Flexion with Resistance - 1 x daily - 5 x weekly - 2 sets - 10 reps Standing Shoulder Flexion with Resistance - 1 x daily - 5 x weekly - 2 sets - 10 reps Standing Single Arm Shoulder Abduction with Resistance - 1 x daily - 5 x weekly - 2 sets - 10 reps Scapular Retraction with Resistance - 1 x daily - 5 x weekly - 2 sets - 10 reps Shoulder Extension with Resistance - Palms Forward - 2 x daily - 5 x weekly - 2 sets - 10 reps                    OT Short Term Goals - 01/06/21 1413       OT SHORT TERM GOAL #1   Title Pt will be  Independent in HEP with focus on [facility] and strengthening as needed for IADLs/leisure pursuits.    Time 4    Period Weeks    Status Achieved    Target Date 12/31/20      OT SHORT TERM GOAL #2   Title Pt will verbalize 3 strategies to increase endurance during home making and/or gardening tasks.    Time 4    Period Weeks    Status Partially Met   provided energy conservation education handouts 12/13              OT Long Term Goals - 01/06/21 1509       OT LONG TERM GOAL #1   Title Pt will demonstrate improvements in 9 hole peg test by 3 seconds to increase coordination and needed for handwriting and leisure pursuits.    Baseline L: 28.15 and R: 31.47    Time 8    Period Weeks    Status On-going      OT LONG TERM GOAL #2   Title Pt will improve box and blocks score by 10 on R to demonstrate improved strength and GMC as needed for ADLs and IADLs     Baseline R: 38 and L: 55   R 48 on 12/13   Time 8    Period Weeks    Status Achieved      OT LONG TERM GOAL #3   Title Pt will demonstrate increased shoulder flexion to achieve high reach to obtain items from higher surfaces as needed for ADLs/IADLs.    Baseline shoulder flexion R: 140 and L: 150    Time 8    Period Weeks    Status On-going      OT LONG TERM GOAL #4   Title Pt will report ability to style hair with increased endurance and </= to 2 rest breaks.    Time 8    Period Weeks    Status On-going                   Plan - 01/22/21 1409     Clinical Impression Statement Pt pleased with writing on check, but still voicing frustration with decreased size and legibility. Therapist provided pt with HEP for shoulder stregthening and stability with use of theraband as pt reports intermittent pain in L shoulder/upper back (due to fall sustained in early Dec).  Pt demonstrating good carryover of education from previous session and this session in regards to focus on symmetrical movements. Pt able to demonstrate understanding of each exercise.    OT Occupational Profile and History Problem Focused Assessment - Including review of records relating to presenting problem    Occupational performance deficits (Please refer to evaluation for details): ADL's;IADL's;Leisure    Body Structure / Function / Physical Skills ADL;Body mechanics;Coordination;FMC;Endurance;Dexterity;GMC;IADL;Mobility;ROM;Proprioception;Pain;Strength;UE functional use    Rehab Potential Good    Clinical Decision Making Limited treatment options, no task modification necessary    Comorbidities Affecting Occupational Performance: May have comorbidities impacting occupational performance    Modification or Assistance to Complete Evaluation  No modification of tasks or assist necessary to complete eval    OT Frequency 2x / week    OT Duration 8 weeks    OT Treatment/Interventions Self-care/ADL  training;Paraffin;Electrical Stimulation;Cryotherapy;Ultrasound;Moist Heat;Fluidtherapy;Therapeutic exercise;Neuromuscular education;Energy conservation;Manual Therapy;Functional Mobility Training;DME and/or AE instruction;Passive range of motion;Patient/family education;Therapeutic activities;Balance training;Psychosocial skills training;Coping strategies training    Plan check off goals and d/c    OT Home Exercise Plan theraband  Consulted and Agree with Plan of Care Patient             Patient will benefit from skilled therapeutic intervention in order to improve the following deficits and impairments:   Body Structure / Function / Physical Skills: ADL, Body mechanics, Coordination, FMC, Endurance, Dexterity, GMC, IADL, Mobility, ROM, Proprioception, Pain, Strength, UE functional use       Visit Diagnosis: Other lack of coordination  Hemiplegia and hemiparesis following cerebral infarction affecting right dominant side (HCC)  Muscle weakness (generalized)    Problem List Patient Active Problem List   Diagnosis Date Noted   Adjustment disorder with depressed mood 10/28/2020   Acute CVA (cerebrovascular accident) ([place]) - with RUE > RLE weakness and expressive aphasia 10/16/2020   Underweight 10/14/2020   Stress 10/14/2020   Allergic rhinitis 10/01/2020   Lipoma 08/21/2020   Osteoporosis 08/21/2020   Femoral fracture ([place]) 09/24/2018   Left displaced femoral neck fracture ([place]) 09/23/2018   Pulmonary nodules 04/10/2014   Thyroid nodule 12/12/2013   Hemangioma of liver 12/12/2013    Simonne Come, OT 01/22/2021, 3:44 PM  [facility] [address] [place], [place], [address] Phone: [phone]   Fax:  [phone]  Name: Latoya Wright MRN: [id number] Date of Birth: 07/30/1945

## 2021-01-22 NOTE — Therapy (Signed)
[facility] [address] [place], [place], [address] Phone: [phone]   Fax:  [phone]  Speech Language Pathology Treatment/Discharge summary  Patient Details  Name: Latoya Wright MRN: [id number] Date of Birth: Sep 20, 1945 Referring Provider (SLP): Dimas Chyle MD   Encounter Date: 01/22/2021   End of Session - 01/22/21 1726     Visit Number 8    Number of Visits 17    Date for SLP Re-Evaluation 02/06/21   extended slightly due to holidays   SLP Start Time 1448    SLP Stop Time  1530    SLP Time Calculation (min) 42 min    Activity Tolerance Patient tolerated treatment well             Past Medical History:  Diagnosis Date   Allergy    Arthritis     Past Surgical History:  Procedure Laterality Date   TOTAL HIP ARTHROPLASTY 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   Procedure: TOTAL HIP ARTHROPLASTY ANTERIOR APPROACH;  Surgeon: Mcarthur Rossetti, MD;  Location: [facility];  Service: Orthopedics;  Laterality: Left;    There were no vitals filed for this visit.  SPEECH THERAPY DISCHARGE SUMMARY  Visits from Start of Care: 8  Current functional level related to goals / functional outcomes: See below   Remaining deficits: Mild dysarthria and mild oral dysphagia related to oral innervation/weakness deficit.   Education / Equipment: Compensations for dysarthria, compensations for oral dysphagia   Patient agrees to discharge. Patient goals were partially met. Patient is being discharged due to being pleased with the current functional level..      Subjective Assessment - 01/22/21 1724     Subjective "Family tells me I sound really good."    Currently in Pain? No/denies                   ADULT SLP TREATMENT - 01/22/21 1515       General Information   Behavior/Cognition Alert;Cooperative;Pleasant mood      Treatment Provided   Treatment provided Cognitive-Linquistic      Cognitive-Linquistic Treatment    Treatment focused on Dysarthria    Skilled Treatment She performed HEP today with independence with excellent success. Latoya Wright maintained slower speech rate with 100% intelligibility in ST room for 10 minutes and outdoors maintained same intelligibility rate in 10 minutes of mod complex conversation. Her conversational speech was averaged in mid-upper 60s dB, she states 2 of her [employer] friends have more difficulty understanding her in the afternoon compared to AMs- Pt remarked that music is louder in PM than in AM and SLP reinforced that background noise may make it more challenging to hear in the PMs.      Assessment / Recommendations / Plan   Plan Discharge SLP treatment due to (comment)      Progression Toward Goals   Progression toward goals Progressing toward goals              SLP Education - 01/22/21 1726     Education Details background noise may make it difficult to hear her voice    Person(s) Educated Patient    Methods Explanation    Comprehension Verbalized understanding              SLP Short Term Goals - 01/07/21 1358       SLP SHORT TERM GOAL #1   Title pt will complete HEP for oral motor strengthening with rare min A over 2 sessions  Baseline 12-09-20    Status Achieved    Target Date 01/02/21      SLP SHORT TERM GOAL #2   Title pt will have no more than 3 episodes of unintelligible speech in 5 minutes simple conversation in 3 sessions, using speech ocmpensation techniques    Baseline 01-06-21    Time 4    Period Weeks    Status Partially Met   extended one week due to absences   Target Date 01/09/21      SLP SHORT TERM GOAL #3   Title pt will produce sentence responses to verbal or written stimuli with average low 70s dB in 3 sessions    Time 4    Period Weeks    Status Deferred    Target Date 01/02/21              SLP Long Term Goals - 01/22/21 1459       SLP LONG TERM GOAL #1   Title pt will have no more than 2 episodes of  unintelligible speech in 10 minutes simple conversation in 3 sessions, using speech compensation techniques    Baseline 01-22-21    Time --    Period --    Status Partially Met      SLP LONG TERM GOAL #2   Title pt will demo oral-motor HEP independently in 3 sessions    Time --    Period --    Status Achieved      SLP LONG TERM GOAL #3   Title pt will indicate less need for lingual sweep during meals than prior to outpatient ST, during last 1-3 ST sessions    Time --    Period --    Status Achieved      SLP LONG TERM GOAL #4   Title pt will improve her PROM score taken from the first 1-2 sessions of outpatient ST    Status Achieved      SLP LONG TERM GOAL #5   Title pt will produce 10 minutes simple-mod complex conversation with average low 70s dB in 3 sessions    Baseline 01-22-21    Status Partially Met              Plan - 01/22/21 1727     Clinical Impression Statement Pt Latoya Wright "Latoya Wright" continues to present with WNL langauge and cognitive ability but with improving mild ataxic dysarthria, 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 See "skilled intervention" for more details. Pt agreed she thought d/c was warranted at this time.    Treatment/Interventions Internal/external aids;Compensatory strategies;Environmental controls;SLP instruction and feedback;Oral motor exercises;Patient/family education    Potential to Achieve Goals Good    SLP Home Exercise Plan pt to bring next session    Consulted and Agree with Plan of Care Patient             Patient will benefit from skilled therapeutic intervention in order to improve the following deficits and impairments:   Dysarthria and anarthria  Dysphagia, unspecified type    Problem List Patient Active Problem List   Diagnosis Date Noted   Adjustment disorder with depressed mood 10/28/2020   Acute CVA (cerebrovascular accident) [facility] - with RUE > RLE weakness and expressive aphasia 10/16/2020    Underweight 10/14/2020   Stress 10/14/2020   Allergic rhinitis 10/01/2020   Lipoma 08/21/2020   Osteoporosis 08/21/2020   Femoral fracture ([place]) 09/24/2018   Left displaced femoral neck fracture ([place]) 09/23/2018  Pulmonary nodules 04/10/2014   Thyroid nodule 12/12/2013   Hemangioma of liver 12/12/2013    Latoya Wright, CCC-SLP 01/22/2021, 5:28 PM  [facility] [address] [place], [place], [address] Phone: [phone]   Fax:  [phone]   Name: SAADIA DEWITT MRN: [id number] Date of Birth: 1945/03/18

## 2021-01-27 ENCOUNTER — Encounter: Payer: Self-pay | Admitting: Occupational Therapy

## 2021-01-27 ENCOUNTER — Other Ambulatory Visit: Payer: Self-pay

## 2021-01-27 ENCOUNTER — Ambulatory Visit: Payer: Medicare PPO | Attending: Family Medicine | Admitting: Occupational Therapy

## 2021-01-27 DIAGNOSIS — R278 Other lack of coordination: Secondary | ICD-10-CM | POA: Diagnosis present

## 2021-01-27 DIAGNOSIS — I69351 Hemiplegia and hemiparesis following cerebral infarction affecting right dominant side: Secondary | ICD-10-CM | POA: Insufficient documentation

## 2021-01-27 DIAGNOSIS — M6281 Muscle weakness (generalized): Secondary | ICD-10-CM | POA: Diagnosis present

## 2021-01-27 NOTE — Therapy (Signed)
[facility] [address], [place], [place], [address] Phone: [phone]   Fax:  [phone]  Occupational Therapy Treatment and Discharge Summary  Patient Details  Name: Latoya Wright MRN: [id number] Date of Birth: Feb 02, 1945 Referring Provider (OT): Vivi Barrack MD  OCCUPATIONAL THERAPY DISCHARGE SUMMARY  Visits from Start of Care: 11  Current functional level related to goals / functional outcomes: Pt has met 4 of 4 long term goals with improved RUE ROM, strength, and stability with overhead reaching; improved attention to and awareness of [facility] and [place] as needed for ADLs, IADLs, and handwriting. Pt continues to voice frustration with decreased size and legibility with handwriting, however it has improved tremendously and when pt utilizes strategies it is even more legible.   Remaining deficits: Mild decreased strength and coordination in dominant RUE when compared to LUE.   Education / Equipment: Has [place] and theraputty HEP and [place] with theraband and weighted exercises and pt has shown engagement in HEP.   Patient agrees to discharge. Patient goals were met. Patient is being discharged due to meeting the stated rehab goals..  Pt is to continue seeing trainer at gym 1x/week and attend gym 5x/week to continue addressing RUE weakness and instability.      Encounter Date: 01/27/2021   OT End of Session - 01/27/21 0925     Visit Number 11    Number of Visits 17    Date for OT Re-Evaluation 01/28/21    Authorization Type [employer]    OT Start Time 0930    OT Stop Time 1016    OT Time Calculation (min) 46 min    Activity Tolerance Patient tolerated treatment well    Behavior During Therapy WFL for tasks assessed/performed             Past Medical History:  Diagnosis Date   Allergy    Arthritis     Past Surgical History:  Procedure Laterality Date   TOTAL HIP ARTHROPLASTY 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   Procedure: TOTAL HIP  ARTHROPLASTY ANTERIOR APPROACH;  Surgeon: Mcarthur Rossetti, MD;  Location: [facility];  Service: Orthopedics;  Laterality: Left;    There were no vitals filed for this visit.   Subjective Assessment - 01/27/21 0925     Subjective  Pt reports going to the gym this morning.  Pt reports plan to continue to work on RUE strengthening and stabilization at gym and with trainer 1x/week.    Pertinent History HTN and HLD    Patient Stated Goals I want to write    Currently in Pain? No/denies                [facility] OT Assessment - 01/27/21 0001       Coordination   Right 9 Hole Peg Test 26.12 (31.47 on eval)    Box and Blocks R: 52 (R: 38 and L: 55 on eval)      Hand Function   Right Hand Grip (lbs) 48    Right Hand Lateral Pinch 19 lbs    Right Hand 3 Point Pinch 14 lbs    Left Hand Grip (lbs) 55               GMC and strengthening with 3# dumb bells with walking while alternating hand swing.  Noted decreased swing in RUE compared to L.  Engaged in chest press, bicep curls, and shoulder external rotation 1 x 12 with focus on symmetrical movements with  RUE.  Noted mild lag in RUE as fatigued.  [place] and shoulder ROM with reaching overhead to mid and high ranges.  Pt with decreased stability and coordination when reaching to higher surfaces, frequently undershooting and bumping in to shelf.  Pt continues to demonstrate undershooting with overhead reach and with lateral movements as with Box and Blocks and 9 hole peg test.    GMC with ball toss R <> L hand with focus on increased arc. Noted increased coordination and motor control, however still with mild decreased height when tossing with R compared to L.  Challenged pt to toss just within R hand with improved height and coordination.  Increased challenge to tossing ball R <> L while ambulating.  Pt with decreased arc with increased workload, dropping ball x2 but overall increased coordination and ability to complete dual task.    Pt  wrote a check with 100% legibility, still small but with improved legibility and adherence to line placement.                 OT Short Term Goals - 01/06/21 1413       OT SHORT TERM GOAL #1   Title Pt will be Independent in HEP with focus on [facility] and strengthening as needed for IADLs/leisure pursuits.    Time 4    Period Weeks    Status Achieved    Target Date 12/31/20      OT SHORT TERM GOAL #2   Title Pt will verbalize 3 strategies to increase endurance during home making and/or gardening tasks.    Time 4    Period Weeks    Status Partially Met   provided energy conservation education handouts 12/13              OT Long Term Goals - 01/27/21 0933       OT LONG TERM GOAL #1   Title Pt will demonstrate improvements in 9 hole peg test by 3 seconds to increase coordination and needed for handwriting and leisure pursuits.    Baseline L: 28.15 and R: 31.47   R: 26.12   Time 8    Period Weeks    Status Achieved      OT LONG TERM GOAL #2   Title Pt will improve box and blocks score by 10 on R to demonstrate improved strength and GMC as needed for ADLs and IADLs    Baseline R: 38 and L: 55   R 48 on 12/13   Time 8    Period Weeks    Status Achieved      OT LONG TERM GOAL #3   Title Pt will demonstrate increased shoulder flexion to achieve high reach to obtain items from higher surfaces as needed for ADLs/IADLs.    Baseline shoulder flexion R: 140 and L: 150   R: 150   Time 8    Period Weeks    Status Achieved      OT LONG TERM GOAL #4   Title Pt will report ability to style hair with increased endurance and </= to 2 rest breaks.    Time 8    Period Weeks    Status Achieved                   Plan - 01/27/21 0926     Clinical Impression Statement Pt pleased with writing on check and line adherence, but still voicing frustration with decreased size. Pt demonstrating good carryover of education from previous  sessions in regards to focus on  symmetrical movements.  Pt has met 4 of 4 LTGs and is independent with HEP.    OT Occupational Profile and History Problem Focused Assessment - Including review of records relating to presenting problem    Occupational performance deficits (Please refer to evaluation for details): ADL's;IADL's;Leisure    Body Structure / Function / Physical Skills ADL;Body mechanics;Coordination;FMC;Endurance;Dexterity;GMC;IADL;Mobility;ROM;Proprioception;Pain;Strength;UE functional use    Rehab Potential Good    Clinical Decision Making Limited treatment options, no task modification necessary    Comorbidities Affecting Occupational Performance: May have comorbidities impacting occupational performance    Modification or Assistance to Complete Evaluation  No modification of tasks or assist necessary to complete eval    OT Frequency 2x / week    OT Duration 8 weeks    OT Treatment/Interventions Self-care/ADL training;Paraffin;Electrical Stimulation;Cryotherapy;Ultrasound;Moist Heat;Fluidtherapy;Therapeutic exercise;Neuromuscular education;Energy conservation;Manual Therapy;Functional Mobility Training;DME and/or AE instruction;Passive range of motion;Patient/family education;Therapeutic activities;Balance training;Psychosocial skills training;Coping strategies training    Plan d/c    OT Home Exercise Plan --    Consulted and Agree with Plan of Care Patient             Patient will benefit from skilled therapeutic intervention in order to improve the following deficits and impairments:   Body Structure / Function / Physical Skills: ADL, Body mechanics, Coordination, FMC, Endurance, Dexterity, GMC, IADL, Mobility, ROM, Proprioception, Pain, Strength, UE functional use       Visit Diagnosis: Other lack of coordination  Hemiplegia and hemiparesis following cerebral infarction affecting right dominant side (HCC)  Muscle weakness (generalized)    Problem List Patient Active Problem List   Diagnosis  Date Noted   Adjustment disorder with depressed mood 10/28/2020   Acute CVA (cerebrovascular accident) ([place]) - with RUE > RLE weakness and expressive aphasia 10/16/2020   Underweight 10/14/2020   Stress 10/14/2020   Allergic rhinitis 10/01/2020   Lipoma 08/21/2020   Osteoporosis 08/21/2020   Femoral fracture ([place]) 09/24/2018   Left displaced femoral neck fracture ([place]) 09/23/2018   Pulmonary nodules 04/10/2014   Thyroid nodule 12/12/2013   Hemangioma of liver 12/12/2013    Simonne Come, OT 01/27/2021, 12:13 PM   [facility] [address] [place], [place], [address] Phone: [phone]   Fax:  [phone]  Name: Latoya Wright MRN: [id number] Date of Birth: 1945/02/07

## 2021-01-28 NOTE — Telephone Encounter (Signed)
Pt called back and she has some questions about the prolia and some other medication. Call back [phone]

## 2021-02-03 ENCOUNTER — Ambulatory Visit: Payer: Medicare PPO | Admitting: Family Medicine

## 2021-02-03 ENCOUNTER — Encounter: Payer: Self-pay | Admitting: Family Medicine

## 2021-02-03 ENCOUNTER — Other Ambulatory Visit: Payer: Self-pay

## 2021-02-03 VITALS — BP 161/69 | HR 79 | Temp 97.9°F | Ht 68.0 in | Wt 107.8 lb

## 2021-02-03 DIAGNOSIS — M810 Age-related osteoporosis without current pathological fracture: Secondary | ICD-10-CM | POA: Diagnosis not present

## 2021-02-03 DIAGNOSIS — I69920 Aphasia following unspecified cerebrovascular disease: Secondary | ICD-10-CM | POA: Diagnosis not present

## 2021-02-03 DIAGNOSIS — D179 Benign lipomatous neoplasm, unspecified: Secondary | ICD-10-CM | POA: Diagnosis not present

## 2021-02-03 DIAGNOSIS — I639 Cerebral infarction, unspecified: Secondary | ICD-10-CM

## 2021-02-03 MED ORDER — TRIAMCINOLONE ACETONIDE 40 MG/ML IJ SUSP: 40.0000 mg | Freq: Once | INTRAMUSCULAR | Status: DC

## 2021-02-03 MED ORDER — METHYLPREDNISOLONE ACETATE 40 MG/ML IJ SUSP
40.0000 mg | Freq: Once | INTRAMUSCULAR | Status: AC
  Administered 2021-02-03: 40 mg via INTRALESIONAL

## 2021-02-03 NOTE — Patient Instructions (Signed)
It was very nice to see you today!  Please come back in a month to have your next Prolia injection.  We injected your lipomas today.  It may take several weeks before you notice a difference.  We can repeat this in 4 to 6 weeks if needed.  Please continue taking aspirin and statin.  Keep an eye on your blood pressure let me know if persistently 150/90 or higher.  Take care, Dr Jerline Pain  PLEASE NOTE:  If you had any lab tests please let us know if you have not heard back within a few days. You may see your results on mychart before we have a chance to review them but we will give you a call once they are reviewed by [place]. If we ordered any referrals today, please let us know if you have not heard from their office within the next week.   Please try these tips to maintain a healthy lifestyle:  Eat at least 3 REAL meals and 1-2 snacks per day.  Aim for no more than 5 hours between eating.  If you eat breakfast, please do so within one hour of getting up.   Each meal should contain half fruits/vegetables, one quarter protein, and one quarter carbs (no bigger than a computer mouse)  Cut down on sweet beverages. This includes juice, soda, and sweet tea.   Drink at least 1 glass of water with each meal and aim for at least 8 glasses per day  Exercise at least 150 minutes every week.

## 2021-02-03 NOTE — Addendum Note (Signed)
Addended by: Gean Birchwood on: 02/03/2021 01:40 PM   Modules accepted: Orders

## 2021-02-03 NOTE — Assessment & Plan Note (Signed)
Doing much better over tThe last couple of months.  She has been working with PT, OT and speech therapy.  Speech is much better.  She will continue with aspirin and statin.

## 2021-02-03 NOTE — Assessment & Plan Note (Signed)
Intralesional injection performed today.  See below procedure note.  She tolerated well.  We can repeat in a month if needed.

## 2021-02-03 NOTE — Progress Notes (Signed)
° °  Latoya Wright is a 76 y.o. female who presents today for an office visit.  Assessment/Plan:  New/Acute Problems: Elevated blood pressure reading Well controlled at home usually in the 120s to 130s.  Elevated today.  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  We will continue monitoring for now.  Discussed home monitoring.  They will let me know if persistently elevated.  Chronic Problems Addressed Today: Osteoporosis Due for prolia next month.   Lipoma Intralesional injection performed today.  See below procedure note.  She tolerated well.  We can repeat in a month if needed.  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  She has been working with PT, OT and speech therapy.  Speech is much better.  She will continue with aspirin and statin.     Subjective:  HPI:  She is here with lipoma on her arms. She would like to try steroid injection for this issue. Had similar issue in the past.  We performed intralesional steroid injection about 3 months ago.  She states this helped significantly with the lesions that were injected.  Denies any pain.   She had increased improvement with her speech. She has followed up with neurology after the stroke. She has gained a little bit weight since last visit. She has been doing well. She is going to gym and has been following up with rehabilitation after the stroke. She has also been following up with PT, OT and speech therapy. Additionally, She is receiving Prolia injection for osteoporosis in next month.        Objective:  Physical Exam: BP (!) 161/69    Pulse 79    Temp 97.9 F (36.6 C) (Temporal)    Ht 5\' 8"  (1.727 m)    Wt 107 lb 12.8 oz (48.9 kg)    SpO2 99%    BMI 16.39 kg/m   Gen: No acute distress, resting comfortably CV: Regular rate and rhythm with no murmurs appreciated Pulm: Normal work of breathing, clear to auscultation bilaterally with no  crackles, wheezes, or rhonchi MSK: Numerous scattered lipomas across upper extremities. Neuro: Grossly normal, moves all extremities Psych: Normal affect and thought content   Procedure Note Verbal consent obtained after discussing risk and benefits.  8 separate lipomas were identified 6 on right upper extremity and 2 on the left upper extremity.  These areas were marked with sterile marker.  Area was then cleansed with alcohol swab.  Topical ethyl chloride was applied for anesthesia. One ml of a 3-1 mixture of 1% lidocaine without epinephrine and 40mg /cc of Depo-Medrol was then injected into each lesion.  Sterile bandages were placed at the site of each injection.  She tolerated well.  Minimal blood loss.      I,Savera Zaman,acting as a Education administrator for Dimas Chyle, MD.,have documented all relevant documentation on the behalf of Dimas Chyle, MD,as directed by  Dimas Chyle, MD while in the presence of Dimas Chyle, MD.   I, Dimas Chyle, MD, have reviewed all documentation for this visit. The documentation on 02/03/21 for the exam, diagnosis, procedures, and orders are all accurate and complete.  Algis Greenhouse. Jerline Pain, MD 02/03/2021 1:33 PM

## 2021-02-03 NOTE — Assessment & Plan Note (Signed)
Due for prolia next month.

## 2021-02-04 ENCOUNTER — Telehealth: Payer: Self-pay

## 2021-02-04 NOTE — Telephone Encounter (Signed)
Prolia Verified   PA Required PA [id number]   Waiting for Determination.

## 2021-02-24 NOTE — Telephone Encounter (Signed)
Approved for Prolia   $0 Dollars   Ready to schedule

## 2021-02-26 ENCOUNTER — Ambulatory Visit: Payer: Medicare PPO

## 2021-02-26 ENCOUNTER — Other Ambulatory Visit: Payer: Self-pay

## 2021-02-26 ENCOUNTER — Ambulatory Visit (INDEPENDENT_AMBULATORY_CARE_PROVIDER_SITE_OTHER): Payer: Medicare PPO | Admitting: *Deleted

## 2021-02-26 DIAGNOSIS — M810 Age-related osteoporosis without current pathological fracture: Secondary | ICD-10-CM

## 2021-02-26 MED ORDER — DENOSUMAB 60 MG/ML ~~LOC~~ SOSY
60.0000 mg | PREFILLED_SYRINGE | Freq: Once | SUBCUTANEOUS | Status: AC
  Administered 2021-02-26: 60 mg via SUBCUTANEOUS

## 2021-02-26 NOTE — Progress Notes (Signed)
Prolia given in right arm per patient preference. Patient tolerated well. 6 month reminder given to patient.   Latoya Wright, CMA

## 2021-02-26 NOTE — Progress Notes (Signed)
I have reviewed the patient's encounter and agree with the documentation.  Algis Greenhouse. Jerline Pain, MD 02/26/2021 10:56 AM

## 2021-03-31 ENCOUNTER — Ambulatory Visit: Payer: Medicare PPO | Admitting: Family Medicine

## 2021-03-31 ENCOUNTER — Encounter: Payer: Self-pay | Admitting: Family Medicine

## 2021-03-31 ENCOUNTER — Other Ambulatory Visit: Payer: Self-pay

## 2021-03-31 VITALS — BP 132/72 | HR 73 | Temp 98.3°F | Ht 68.0 in | Wt 108.6 lb

## 2021-03-31 DIAGNOSIS — J309 Allergic rhinitis, unspecified: Secondary | ICD-10-CM

## 2021-03-31 DIAGNOSIS — I69320 Aphasia following cerebral infarction: Secondary | ICD-10-CM | POA: Diagnosis not present

## 2021-03-31 DIAGNOSIS — I639 Cerebral infarction, unspecified: Secondary | ICD-10-CM

## 2021-03-31 DIAGNOSIS — I69351 Hemiplegia and hemiparesis following cerebral infarction affecting right dominant side: Secondary | ICD-10-CM

## 2021-03-31 DIAGNOSIS — D179 Benign lipomatous neoplasm, unspecified: Secondary | ICD-10-CM

## 2021-03-31 MED ORDER — METHYLPREDNISOLONE ACETATE 40 MG/ML IJ SUSP
40.0000 mg | Freq: Once | INTRAMUSCULAR | Status: AC
  Administered 2021-03-31: 40 mg via INTRAMUSCULAR

## 2021-03-31 NOTE — Addendum Note (Signed)
Addended by: Verlon Setting on: 03/31/2021 09:14 AM ? ? Modules accepted: Orders ? ?

## 2021-03-31 NOTE — Progress Notes (Addendum)
? ?  Latoya Wright is a 76 y.o. female who presents today for an office visit. ? ?Assessment/Plan:  ?New/Acute Problems: ?Abdominal pain ?No red flags.  Likely positional in nature.  Seems to be musculoskeletal.  We discussed trial of anti-inflammatories however she declined.  We will continue with watchful waiting.  We discussed modification of sitting posture.  If not improving would consider referral to PT or sports med. ? 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  Speech is much better.  She will continue with aspirin and statin.  Will place referral to cardiology per patient request. ? ?Lipoma ?Intralesional injection performed today.  See procedure note.  She tolerated well.  Can repeat in a month as needed. ? ?Allergic rhinitis ?Stable.  Can continue over-the-counter meds as needed. ? ? ?  ?Subjective:  ?HPI: ? ?Patient here with abdominal pain. This has been going on for the past couple of months. Located on lower abdomen. She notes this usually happen at night. Symptoms subsided during the day. Worse with certain motions. She notes symptoms get worse with walking. She notes standing up help with the symptoms. No treatment tried. She thinks this could be related to Covid. Denies reported nausea or vomiting. No diarrhea ? ?Additionally, she is requesting steroid shots for her lipoma. She had similar issue in the last visit. We performed intralesional injection. This has not helped. She has noticed some new lipoma on the right arm. No pain to the area. She has been doing well since last visit. Her speech has improved after the stroke. She is following healthy diet.  ? ? ?   ?  ?Objective:  ?Physical Exam: ?BP 132/72 (BP Location: Left Arm)   Pulse 73   Temp 98.3 ?F (36.8 ?C) (Temporal)   Ht '5\' 8"'$  (1.727 m)   Wt 108 lb 9.6 oz (49.3 kg)   SpO2 100%   BMI 16.51 kg/m?   ?Gen: No acute distress, resting  comfortably ?CV: Regular rate and rhythm with no murmurs appreciated ?Pulm: Normal work of breathing, clear to auscultation bilaterally with no crackles, wheezes, or rhonchi ?GI: Soft, nontender, nondistended.  Tenderness to palpation along lower rib margin bilaterally. ?Skin: Several scattered lipomas on upper extremities bilaterally. ?Neuro: Grossly normal, moves all extremities ?Psych: Normal affect and thought content ? ?Procedure Note ?Verbal consent obtained after discussing risk and benefits.  8 separate lipomas were identified on her right upper extremity.  These areas were marked with sterile marker.  Area was then cleansed with alcohol swab.  Topical ethyl chloride was applied for anesthesia. One ml of a 3-1 mixture of 1% lidocaine without epinephrine and '40mg'$ /cc of Depo-Medrol was then injected into each lesion for a total of '10mg'$  depomedrol in each lesion. Sterile bandages were placed at the site of each injection.  She tolerated well.  Minimal blood loss. ?  ? ? ?   ? ? ?I,Savera Zaman,acting as a Education administrator for Dimas Chyle, MD.,have documented all relevant documentation on the behalf of Dimas Chyle, MD,as directed by  Dimas Chyle, MD while in the presence of Dimas Chyle, MD.  ? ?I, Dimas Chyle, MD, have reviewed all documentation for this visit. The documentation on 03/31/21 for the exam, diagnosis, procedures, and orders are all accurate and complete. ? ?Algis Greenhouse. Jerline Pain, MD ?03/31/2021 8:56 AM  ? ?

## 2021-03-31 NOTE — Patient Instructions (Signed)
It was very nice to see you today! ? ?We injected your lipomas today.  Please let us know if you want to do another injection about a month. ? ?We will refer you to see a cardiologist. ? ?Take care, ?Dr Jerline Pain ? ?PLEASE NOTE: ? ?If you had any lab tests please let us know if you have not heard back within a few days. You may see your results on mychart before we have a chance to review them but we will give you a call once they are reviewed by [place]. If we ordered any referrals today, please let us know if you have not heard from their office within the next week.  ? ?Please try these tips to maintain a healthy lifestyle: ? ?Eat at least 3 REAL meals and 1-2 snacks per day.  Aim for no more than 5 hours between eating.  If you eat breakfast, please do so within one hour of getting up.  ? ?Each meal should contain half fruits/vegetables, one quarter protein, and one quarter carbs (no bigger than a computer mouse) ? ?Cut down on sweet beverages. This includes juice, soda, and sweet tea.  ? ?Drink at least 1 glass of water with each meal and aim for at least 8 glasses per day ? ?Exercise at least 150 minutes every week.   ?Mediterranean Diet ?A Mediterranean diet refers to food and lifestyle choices that are based on the traditions of countries located on the [employer]. It focuses on eating more fruits, vegetables, whole grains, beans, nuts, seeds, and heart-healthy fats, and eating less dairy, meat, eggs, and processed foods with added sugar, salt, and fat. This way of eating has been shown to help prevent certain conditions and improve outcomes for people who have chronic diseases, like kidney disease and heart disease. ?What are tips for following this plan? ?Reading food labels ?Check the serving size of packaged foods. For foods such as rice and pasta, the serving size refers to the amount of cooked product, not dry. ?Check the total fat in packaged foods. Avoid foods that have saturated fat or trans  fats. ?Check the ingredient list for added sugars, such as corn syrup. ?Shopping ? ?Buy a variety of foods that offer a balanced diet, including: ?Fresh fruits and vegetables (produce). ?Grains, beans, nuts, and seeds. Some of these may be available in unpackaged forms or large amounts (in bulk). ?Fresh seafood. ?Poultry and eggs. ?Low-fat dairy products. ?Buy whole ingredients instead of prepackaged foods. ?Buy fresh fruits and vegetables in-season from local farmers markets. ?Buy plain frozen fruits and vegetables. ?If you do not have access to quality fresh seafood, buy precooked frozen shrimp or canned fish, such as tuna, salmon, or sardines. ?Stock your pantry so you always have certain foods on hand, such as olive oil, canned tuna, canned tomatoes, rice, pasta, and beans. ?Cooking ?Cook foods with extra-virgin olive oil instead of using butter or other vegetable oils. ?Have meat as a side dish, and have vegetables or grains as your main dish. This means having meat in small portions or adding small amounts of meat to foods like pasta or stew. ?Use beans or vegetables instead of meat in common dishes like chili or lasagna. ?Experiment with different cooking methods. Try roasting, broiling, steaming, and saut?ing vegetables. ?Add frozen vegetables to soups, stews, pasta, or rice. ?Add nuts or seeds for added healthy fats and plant protein at each meal. You can add these to yogurt, salads, or vegetable dishes. ?Marinate fish or vegetables  using olive oil, lemon juice, garlic, and fresh herbs. ?Meal planning ?Plan to eat one vegetarian meal one day each week. Try to work up to two vegetarian meals, if possible. ?Eat seafood two or more times a week. ?Have healthy snacks readily available, such as: ?Vegetable sticks with hummus. ?[place] yogurt. ?Fruit and nut trail mix. ?Eat balanced meals throughout the week. This includes: ?Fruit: 2-3 servings a day. ?Vegetables: 4-5 servings a day. ?Low-fat dairy: 2 servings a  day. ?Fish, poultry, or lean meat: 1 serving a day. ?Beans and legumes: 2 or more servings a week. ?Nuts and seeds: 1-2 servings a day. ?Whole grains: 6-8 servings a day. ?Extra-virgin olive oil: 3-4 servings a day. ?Limit red meat and sweets to only a few servings a month. ?Lifestyle ? ?Cook and eat meals together with your family, when possible. ?Drink enough fluid to keep your urine pale yellow. ?Be physically active every day. This includes: ?Aerobic exercise like running or swimming. ?Leisure activities like gardening, walking, or housework. ?Get 7-8 hours of sleep each night. ?If recommended by your health care provider, drink red wine in moderation. This means 1 glass a day for nonpregnant women and 2 glasses a day for men. A glass of wine equals 5 oz (150 mL). ?What foods should I eat? ?Fruits ?Apples. Apricots. Avocado. Berries. Bananas. Cherries. Dates. Figs. Grapes. Lemons. Melon. Oranges. Peaches. Plums. Pomegranate. ?Vegetables ?Artichokes. Beets. Broccoli. Cabbage. Carrots. Eggplant. Green beans. Chard. Kale. Spinach. Onions. Leeks. Peas. Squash. Tomatoes. Peppers. Radishes. ?Grains ?Whole-grain pasta. Brown rice. Bulgur wheat. Polenta. Couscous. Whole-wheat bread. [place]. ?Meats and other proteins ?Beans. Almonds. Sunflower seeds. Pine nuts. Peanuts. [place]. Salmon. Scallops. Shrimp. [place]. Tilapia. Clams. Oysters. Eggs. Poultry without skin. ?Dairy ?Low-fat milk. Cheese. Greek yogurt. ?Fats and oils ?Extra-virgin olive oil. Avocado oil. Grapeseed oil. ?Beverages ?Water. Red wine. Herbal tea. ?Sweets and desserts ?Greek yogurt with honey. Baked apples. Poached pears. Trail mix. ?Seasonings and condiments ?Basil. Cilantro. Coriander. Cumin. Mint. Parsley. Sage. Rosemary. Tarragon. Garlic. Oregano. Thyme. Pepper. Balsamic vinegar. Tahini. Hummus. Tomato sauce. Olives. Mushrooms. ?The items listed above may not be a complete list of foods and beverages you can eat. Contact a dietitian for more  information. ?What foods should I limit? ?This is a list of foods that should be eaten rarely or only on special occasions. ?Fruits ?Fruit canned in syrup. ?Vegetables ?Deep-fried potatoes (french fries). ?Grains ?Prepackaged pasta or rice dishes. Prepackaged cereal with added sugar. Prepackaged snacks with added sugar. ?Meats and other proteins ?Beef. Pork. Lamb. Poultry with skin. Hot dogs. [place] Sal[place]. ?Dairy ?Ice cream. Sour cream. Whole milk. ?Fats and oils ?Butter. Canola oil. Vegetable oil. Beef fat (tallow). Lard. ?Beverages ?Juice. Sugar-sweetened soft drinks. Beer. Liquor and spirits. ?Sweets and desserts ?Cookies. Cakes. Pies. Candy. ?Seasonings and condiments ?Mayonnaise. Pre-made sauces and marinades. ?The items listed above may not be a complete list of foods and beverages you should limit. Contact a dietitian for more information. ?Summary ?The Mediterranean diet includes both food and lifestyle choices. ?Eat a variety of fresh fruits and vegetables, beans, nuts, seeds, and whole grains. ?Limit the amount of red meat and sweets that you eat. ?If recommended by your health care provider, drink red wine in moderation. This means 1 glass a day for nonpregnant women and 2 glasses a day for men. A glass of wine equals 5 oz (150 mL). ?This information is not intended to replace advice given to you by your health care provider. Make sure you discuss any questions you have with your  health care provider. ?Document Revised: 02/16/2019 Document Reviewed: 12/14/2018 ?Elsevier Patient Education ? [place]. ? ?

## 2021-03-31 NOTE — Assessment & Plan Note (Signed)
Intralesional injection performed today.  See procedure note.  She tolerated well.  Can repeat in a month as needed. ?

## 2021-03-31 NOTE — Assessment & Plan Note (Signed)
Doing much better.  Speech is much better.  She will continue with aspirin and statin.  Will place referral to cardiology per patient request. ?

## 2021-03-31 NOTE — Assessment & Plan Note (Signed)
Stable.  Can continue over-the-counter meds as needed. ?

## 2021-04-12 NOTE — Progress Notes (Signed)
?Cardiology Office Note:   ?Date:  04/13/2021  ?NAME:  Latoya Wright    ?MRN: [id number] ?DOB:  04-14-1945  ? ?PCP:  Vivi Barrack, MD  ?Cardiologist:  None  ?Electrophysiologist:  None  ? ?Referring MD: Vivi Barrack, MD  ? ?Chief Complaint  ?Patient presents with  ? Cerebrovascular Accident  ? ?History of Present Illness:   ?Latoya Wright is a 76 y.o. female with a hx of HTN, HLD who is being seen today for the evaluation of stroke at the request of Vivi Barrack, MD. she suffered from small vessel stroke in September 2022.  I reviewed the results of her MRI.  This shows lacunar infarcts.  This is small vessel disease.  This was explained to her in the office.  There was no concern for cardioembolic stroke.  She really is quite healthy.  Her blood pressure is elevated today but she is on no medication.  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  She has had no further strokelike symptoms.  She does have some balance issues and works with therapy on this.  She is a retired Statistician.  She exercises 6 days/week with her husband.  Denies any chest pain or trouble breathing.  EKG really unremarkable.  LDL cholesterol is not at goal.  Has not been rechecked since her stroke.  We discussed rechecking it to make sure her LDL cholesterol is at goal.  No strong family history of heart disease.  She does not smoke.  No alcohol or drug use is reported.  She has 2 children.  She presents with her husband today.  Overall without any complaints.  Cardiovascular examination is normal.  An echocardiogram was performed with her stroke work-up that showed normal LV function. ? ?Problem List ?CVA, lacunar 09/2020 ?-L corona radiata, L basal ganglia infarct (lacunar) ?2. HTN ?3. HLD ?-T chol 212, HDL 85, LDL 121, TG 29 ? ?Past Medical History: ?Past Medical History:  ?Diagnosis Date  ? Allergy   ? Arthritis   ? Stroke [facility])   ? ? ?Past Surgical History: ?Past Surgical History:  ?Procedure Laterality Date  ?  TOTAL HIP ARTHROPLASTY 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  ? Procedure: TOTAL HIP ARTHROPLASTY ANTERIOR APPROACH;  Surgeon: Mcarthur Rossetti, MD;  Location: [facility] ORS;  Service: Orthopedics;  Laterality: Left;  ? ? ?Current Medications: ?Current Meds  ?Medication Sig  ? aspirin EC 81 MG EC tablet Take 1 tablet (81 mg total) by mouth daily. Swallow whole.  ? atorvastatin (LIPITOR) 40 MG tablet Take 1 tablet (40 mg total) by mouth at bedtime.  ? Biotin 5 MG TABS Take 5 mg by mouth daily.  ? calcium-vitamin D (OSCAL 500/200 D-3) 500-200 MG-UNIT tablet Take 1 tablet by mouth 2 (two) times daily.  ? VITAMIN D PO Take by mouth daily.  ?  ? ?Allergies:    ?Dayquil [[id number]]  ? ?Social History: ?Social History  ? ?Socioeconomic History  ? Marital status: Married  ?  Spouse name: Not on file  ? Number of children: 2  ? Years of education: Not on file  ? Highest education level: Not on file  ?Occupational History  ? Occupation: Education officer, museum - Retired  ?Tobacco Use  ? Smoking status: Never  ? Smokeless tobacco: Never  ?Substance and Sexual Activity  ? Alcohol use: No  ? Drug use: No  ? Sexual activity: Not on file  ?Other Topics Concern  ? Not on file  ?Social History Narrative  ?  Not on file  ? ?Social Determinants of Health  ? ?Financial Resource Strain: Not on file  ?Food Insecurity: Not on file  ?Transportation Needs: Not on file  ?Physical Activity: Not on file  ?Stress: Not on file  ?Social Connections: Not on file  ?  ? ?Family History: ?The patient's family history includes Heart disease in her father, maternal grandfather, maternal grandmother, paternal grandfather, and paternal grandmother; Rheum arthritis in her brother and sister. ? ?ROS:   ?All other ROS reviewed and negative. Pertinent positives noted in the HPI.    ? ?EKGs/Labs/Other Studies Reviewed:   ?The following studies were personally reviewed by me today: ? ?EKG:  EKG is ordered today.  The ekg ordered today demonstrates normal sinus rhythm heart 75,  no acute ischemic changes or evidence of infarction, and was personally reviewed by me.  ? ?TTE 10/17/2020 ? 1. Left ventricular ejection fraction, by estimation, is 60 to 65%. The  ?left ventricle has normal function. The left ventricle has no regional  ?wall motion abnormalities. Left ventricular diastolic parameters are  ?indeterminate.  ? 2. Right ventricular systolic function is normal. The right ventricular  ?size is normal. There is normal pulmonary artery systolic pressure.  ? 3. The mitral valve is grossly normal. Trivial mitral valve  ?regurgitation. No evidence of mitral stenosis.  ? 4. The aortic valve was not well visualized. Aortic valve regurgitation  ?is not visualized. No aortic stenosis is present ? ?Carotid US 10/18/2020 ? ?Summary:  ?Right Carotid: Velocities in the right ICA are consistent with a 1-39%  ?stenosis.  ? ?Left Carotid: Velocities in the left ICA are consistent with a 1-39%  ?stenosis.  ? ?Vertebrals: Bilateral vertebral arteries demonstrate antegrade flow.  ? ?Recent Labs: ?10/14/2020: TSH 0.94 ?10/18/2020: BUN 16; Creatinine, Ser 0.77; Hemoglobin 13.2; Platelets 243; Potassium 3.7; Sodium 142 ?10/19/2020: ALT 15  ? ?Recent Lipid Panel ?   ?Component Value Date/Time  ? CHOL 212 (H) 10/17/2020 0325  ? TRIG 29 10/17/2020 0325  ? HDL 85 10/17/2020 0325  ? CHOLHDL 2.5 10/17/2020 0325  ? VLDL 6 10/17/2020 0325  ? [place] 121 (H) 10/17/2020 0325  ? ? ?Physical Exam:   ?VS:  BP (!) 166/86   Pulse 75   Ht '5\' 8"'$  (1.727 m)   Wt 109 lb 9.6 oz (49.7 kg)   SpO2 98%   BMI 16.66 kg/m?    ?Wt Readings from Last 3 Encounters:  ?04/13/21 109 lb 9.6 oz (49.7 kg)  ?03/31/21 108 lb 9.6 oz (49.3 kg)  ?02/03/21 107 lb 12.8 oz (48.9 kg)  ?  ?General: Well nourished, well developed, in no acute distress ?Head: Atraumatic, normal size  ?Eyes: PEERLA, EOMI  ?Neck: Supple, no JVD ?Endocrine: No thryomegaly ?Cardiac: Normal S1, S2; RRR; no murmurs, rubs, or gallops ?Lungs: Clear to auscultation bilaterally, no  wheezing, rhonchi or rales  ?Abd: Soft, nontender, no hepatomegaly  ?Ext: No edema, pulses 2+ ?Musculoskeletal: No deformities, BUE and BLE strength normal and equal ?Skin: Warm and dry, no rashes   ?Neuro: Alert and oriented to person, place, time, and situation, CNII-XII grossly intact, no focal deficits  ?Psych: Normal mood and affect  ? ?ASSESSMENT:   ?Latoya Wright is a 76 y.o. female who presents for the following: ?1. Intracranial atherosclerosis   ?2. History of stroke   ?3. Mixed hyperlipidemia   ? ? ?PLAN:   ?1. Intracranial atherosclerosis ?2. History of stroke ?3. Mixed hyperlipidemia ?-Small vessel disease, lacunar infarct in September 2022.  This is not cardioembolic.  This does not merit further cardiac work-up.  Suspect this is related to elevated LDL cholesterol as well as aging.  No concern for amyloid angiopathy.  Really would just recommend aspirin as well as LDL lowering.  She needs to have her LDL cholesterol less than 70.  She is on Lipitor 40 mg daily.  Needs a recheck lipid profile.  We will set her up for this.  I will then make recommendations based on the results of this test.  Her EKG is normal.  Cardiovascular examination is normal.  I see no need for further cardiac testing. ? ?Disposition: Return if symptoms worsen or fail to improve. ? ?Medication Adjustments/Labs and Tests Ordered: ?Current medicines are reviewed at length with the patient today.  Concerns regarding medicines are outlined above.  ?Orders Placed This Encounter  ?Procedures  ? Lipid panel  ? EKG 12-Lead  ? ?No orders of the defined types were placed in this encounter. ? ? ?Patient Instructions  ?Medication Instructions:  ?The current medical regimen is effective;  continue present plan and medications. ? ?*If you need a refill on your cardiac medications before your next appointment, please call your pharmacy* ? ? ?Lab Work: ?LIPID (come back next week, fasting- nothing to eat or drink, no lab appointment needed) ? ?If  you have labs (blood work) drawn today and your tests are completely normal, you will receive your results only by: ?MyChart Message (if you have MyChart) OR ?A paper copy in the mail ?If you have any lab tes

## 2021-04-13 ENCOUNTER — Encounter: Payer: Self-pay | Admitting: Cardiovascular Disease

## 2021-04-13 ENCOUNTER — Ambulatory Visit: Payer: Medicare PPO | Admitting: Cardiovascular Disease

## 2021-04-13 ENCOUNTER — Other Ambulatory Visit: Payer: Self-pay

## 2021-04-13 VITALS — BP 166/86 | HR 75 | Ht 68.0 in | Wt 109.6 lb

## 2021-04-13 DIAGNOSIS — I672 Cerebral atherosclerosis: Secondary | ICD-10-CM | POA: Diagnosis not present

## 2021-04-13 DIAGNOSIS — Z8673 Personal history of transient ischemic attack (TIA), and cerebral infarction without residual deficits: Secondary | ICD-10-CM

## 2021-04-13 DIAGNOSIS — E782 Mixed hyperlipidemia: Secondary | ICD-10-CM | POA: Diagnosis not present

## 2021-04-13 NOTE — Patient Instructions (Signed)
Medication Instructions:  ?The current medical regimen is effective;  continue present plan and medications. ? ?*If you need a refill on your cardiac medications before your next appointment, please call your pharmacy* ? ? ?Lab Work: ?LIPID (come back next week, fasting- nothing to eat or drink, no lab appointment needed) ? ?If you have labs (blood work) drawn today and your tests are completely normal, you will receive your results only by: ?MyChart Message (if you have MyChart) OR ?A paper copy in the mail ?If you have any lab test that is abnormal or we need to change your treatment, we will call you to review the results. ? ? ?Follow-Up: ?At [facility], you and your health needs are our priority.  As part of our continuing mission to provide you with exceptional heart care, we have created designated Provider Care Teams.  These Care Teams include your primary Cardiologist (physician) and Advanced Practice Providers (APPs -  Physician Assistants and Nurse Practitioners) who all work together to provide you with the care you need, when you need it. ? ?We recommend signing up for the patient portal called "MyChart".  Sign up information is provided on this After Visit Summary.  MyChart is used to connect with patients for Virtual Visits (Telemedicine).  Patients are able to view lab/test results, encounter notes, upcoming appointments, etc.  Non-urgent messages can be sent to your provider as well.   ?To learn more about what you can do with MyChart, go to [email or web address].   ? ?Your next appointment:   ?As needed ? ?The format for your next appointment:   ?In Person ? ?Provider:   ?Eleonore Chiquito, MD  ? ? ?

## 2021-04-15 LAB — LIPID PANEL
Chol/HDL Ratio: 1.8 ratio (ref 0.0–4.4)
Cholesterol, Total: 182 mg/dL (ref 100–199)
HDL: 102 mg/dL (ref >39)
LDL Chol Calc (NIH): 69 mg/dL (ref 0–99)
Triglycerides: 54 mg/dL (ref 0–149)
VLDL Cholesterol Cal: 11 mg/dL (ref 5–40)

## 2021-04-17 ENCOUNTER — Ambulatory Visit (INDEPENDENT_AMBULATORY_CARE_PROVIDER_SITE_OTHER): Payer: Medicare PPO

## 2021-04-17 ENCOUNTER — Emergency Department (HOSPITAL_BASED_OUTPATIENT_CLINIC_OR_DEPARTMENT_OTHER)
Admission: EM | Admit: 2021-04-17 | Discharge: 2021-04-17 | Disposition: A | Discharge status: Home / Self Care | Payer: Medicare PPO | Attending: Emergency Medicine | Admitting: Emergency Medicine

## 2021-04-17 ENCOUNTER — Encounter (HOSPITAL_BASED_OUTPATIENT_CLINIC_OR_DEPARTMENT_OTHER): Payer: Self-pay | Admitting: *Deleted

## 2021-04-17 ENCOUNTER — Other Ambulatory Visit: Payer: Self-pay

## 2021-04-17 ENCOUNTER — Telehealth: Payer: Self-pay | Admitting: Cardiovascular Disease

## 2021-04-17 DIAGNOSIS — Z7982 Long term (current) use of aspirin: Secondary | ICD-10-CM | POA: Insufficient documentation

## 2021-04-17 DIAGNOSIS — R002 Palpitations: Secondary | ICD-10-CM | POA: Insufficient documentation

## 2021-04-17 LAB — CBC
HCT: 41.2 % (ref 36.0–46.0)
Hemoglobin: 13.4 g/dL (ref 12.0–15.0)
MCH: 29.5 pg (ref 26.0–34.0)
MCHC: 32.5 g/dL (ref 30.0–36.0)
MCV: 90.7 fL (ref 80.0–100.0)
Platelets: 230 10*3/uL (ref 150–400)
RBC: 4.54 MIL/uL (ref 3.87–5.11)
RDW: 12.4 % (ref 11.5–15.5)
WBC: 5.9 10*3/uL (ref 4.0–10.5)
nRBC: 0 % (ref 0.0–0.2)

## 2021-04-17 LAB — BASIC METABOLIC PANEL
Anion gap: 8 (ref 5–15)
BUN: 27 mg/dL — ABNORMAL HIGH (ref 8–23)
CO2: 29 mmol/L (ref 22–32)
Calcium: 9.5 mg/dL (ref 8.9–10.3)
Chloride: 108 mmol/L (ref 98–111)
Creatinine, Ser: 0.77 mg/dL (ref 0.44–1.00)
GFR, Estimated: >60 mL/min (ref >60)
Glucose, Bld: 97 mg/dL (ref 70–99)
Potassium: 3.8 mmol/L (ref 3.5–5.1)
Sodium: 145 mmol/L (ref 135–145)

## 2021-04-17 NOTE — Telephone Encounter (Signed)
Monitor ordered-  ?Patient sent mychart message with instructions.  ? ?

## 2021-04-17 NOTE — Discharge Instructions (Signed)
Follow-up with your cardiologist, and return to the emergency department if your symptoms recur and persist. ? ?Continue other medications as previously prescribed. ?

## 2021-04-17 NOTE — Progress Notes (Unsigned)
Enrolled patient for a 7 day Zio XT monitor to be mailed to patients home.  

## 2021-04-17 NOTE — Telephone Encounter (Signed)
Spoke with pt regarding Dr. Audie Box recommendations. Gave pt instructions regarding zio monitor. Address confirmed per pt. Pt verbalizes understanding. ?

## 2021-04-17 NOTE — Telephone Encounter (Signed)
Patient called in to let Dr. Audie Box know she had to go to the ER on last night and they told her to infor her dr. Please advise ?

## 2021-04-17 NOTE — Telephone Encounter (Signed)
Spoke with pt regarding her visit to the ED last night. Pt states that she was awakened from sleep late last night, pt states that her heart felt like it was racing and pounding hard. Pt states heart rate was in the 140 range and lasted for about 15-20 mins. Pt states that by the time she made it to the ED her heart rate had come back down. Pt was told by ED physician to call her cardiologist and suggested ordering a monitor to further evaluate. Pt would like to know what Dr. Audie Box advises. Will forward to provider.  ?

## 2021-04-17 NOTE — ED Triage Notes (Signed)
Pt says she woke up from her sleep because her heart was beating fast, heart rate was in the 140's. Lasted about 20 minutes. Denies pain or SOB.  ?

## 2021-04-17 NOTE — ED Provider Notes (Signed)
?[facility] ?Provider Note ? ? ?CSN: [id number] ?Arrival date & time: 04/17/21  0021 ? ?  ? ?History ? ?Chief Complaint  ?Patient presents with  ? Palpitations  ? ? ?Latoya Wright is a 76 y.o. female. ? ?Patient is a 76 year old female with history of prior CVA.  Patient presenting today with complaints of palpitations.  She woke from sleep just prior to arrival with her heart racing.  This lasted for approximately 15 minutes, then resolved prior to arrival here.  Patient denies any chest pain or shortness of breath during this episode.  This is never happened to her before.  She denies caffeine or alcohol intake. ? ?The history is provided by the patient.  ?Palpitations ?Palpitations quality:  Fast ?Onset quality:  Sudden ?Duration:  15 minutes ?Timing:  Constant ?Progression:  Resolved ?Chronicity:  New ?Relieved by:  Nothing ?Worsened by:  Nothing ? ?  ? ?Home Medications ?Prior to Admission medications   ?Medication Sig Start Date End Date Taking? Authorizing Provider  ?aspirin EC 81 MG EC tablet Take 1 tablet (81 mg total) by mouth daily. Swallow whole. 10/21/20   Mercy Riding, MD  ?atorvastatin (LIPITOR) 40 MG tablet Take 1 tablet (40 mg total) by mouth at bedtime. 10/21/20   Mercy Riding, MD  ?Biotin 5 MG TABS Take 5 mg by mouth daily.    [provider]  ?calcium-vitamin D (OSCAL 500/200 D-3) 500-200 MG-UNIT tablet Take 1 tablet by mouth 2 (two) times daily. 10/21/20 10/21/21  Mercy Riding, MD  ?VITAMIN D PO Take by mouth daily.    [provider]  ?   ? ?Allergies    ?Dayquil [pseudoephedrine-apap-dm]   ? ?Review of Systems   ?Review of Systems  ?Cardiovascular:  Positive for palpitations.  ?All other systems reviewed and are negative. ? ?Physical Exam ?Updated Vital Signs ?BP (!) 157/81   Pulse 82   Temp 98.3 ?F (36.8 ?C) (Oral)   Resp 15   Ht '5\' 8"'$  (1.727 m)   Wt 49 kg   SpO2 98%   BMI 16.42 kg/m?  ?Physical Exam ?Vitals and nursing note reviewed.   ?Constitutional:   ?   General: She is not in acute distress. ?   Appearance: She is well-developed. She is not diaphoretic.  ?HENT:  ?   Head: Normocephalic and atraumatic.  ?Cardiovascular:  ?   Rate and Rhythm: Normal rate and regular rhythm.  ?   Heart sounds: No murmur heard. ?  No friction rub. No gallop.  ?Pulmonary:  ?   Effort: Pulmonary effort is normal. No respiratory distress.  ?   Breath sounds: Normal breath sounds. No wheezing.  ?Abdominal:  ?   General: Bowel sounds are normal. There is no distension.  ?   Palpations: Abdomen is soft.  ?   Tenderness: There is no abdominal tenderness.  ?Musculoskeletal:     ?   General: Normal range of motion.  ?   Cervical back: Normal range of motion and neck supple.  ?Skin: ?   General: Skin is warm and dry.  ?Neurological:  ?   General: No focal deficit present.  ?   Mental Status: She is alert and oriented to person, place, and time.  ? ? ?ED Results / Procedures / Treatments   ?Labs ?(all labs ordered are listed, but only abnormal results are displayed) ?Labs Reviewed  ?BASIC METABOLIC PANEL - Abnormal; Notable for the following components:  ?  Result Value  ? BUN 27 (*)   ? All other components within normal limits  ?CBC  ? ? ?EKG ?EKG Interpretation ? ?Date/Time:  Friday April 17 2021 00:31:14 EDT ?Ventricular Rate:  89 ?PR Interval:  220 ?QRS Duration: 78 ?QT Interval:  373 ?QTC Calculation: 536 ?R Axis:   77 ?Text Interpretation: Sinus rhythm Prolonged PR interval Probable left atrial enlargement Probable left ventricular hypertrophy Confirmed by Veryl Speak [phone]) on 04/17/2021 12:37:52 AM ? ?Radiology ?No results found. ? ?Procedures ?Procedures  ?Continuous cardiac monitoring ? ?Medications Ordered in ED ?Medications - No data to display ? ?ED Course/ Medical Decision Making/ A&P ? ?Patient presenting here with complaints of palpitations.  This woke her from sleep tonight and lasted for approximately 15 minutes.  Her symptoms resolved prior to  arrival.  She arrives here in sinus rhythm with stable vital signs and normal physical examination.  EKG shows a sinus rhythm with no acute changes.  CBC and basic metabolic panel basically unremarkable. ? ?I am uncertain as to the etiology of these palpitations as symptoms resolved prior to arrival.  I feel as though she can safely be discharged at this point with cardiology follow-up.  If her symptoms recur and persist, she has been advised to return to the ER.  If she continues to have brief episodes of these palpitations, she should follow-up with her cardiologist for consideration of Holter/event monitoring. ? ?Final Clinical Impression(s) / ED Diagnoses ?Final diagnoses:  ?None  ? ? ?Rx / DC Orders ?ED Discharge Orders   ? ? None  ? ?  ? ? ?  ?Veryl Speak, MD ?04/17/21 0211 ? ?

## 2021-04-20 DIAGNOSIS — R002 Palpitations: Secondary | ICD-10-CM | POA: Diagnosis not present

## 2021-04-22 ENCOUNTER — Other Ambulatory Visit: Payer: Self-pay

## 2021-04-23 ENCOUNTER — Telehealth: Payer: Self-pay | Admitting: Cardiovascular Disease

## 2021-04-23 NOTE — Telephone Encounter (Signed)
?*  STAT* If patient is at the pharmacy, call can be transferred to refill team. ? ? ?1. Which medications need to be refilled? (please list name of each medication and dose if known)  ?atorvastatin (LIPITOR) 40 MG tablet ? ?2. Which pharmacy/location (including street and city if local pharmacy) is medication to be sent to? ?[facility] [id number] [place], [place] ? ?3. Do they need a 30 day or 90 day supply?  ? ?90 day supply ? ?Patient states she took her last tablet last night  ? ?

## 2021-04-24 ENCOUNTER — Other Ambulatory Visit: Payer: Self-pay

## 2021-04-24 MED ORDER — ATORVASTATIN CALCIUM 40 MG PO TABS: 40.0000 mg | ORAL_TABLET | Freq: Every day | ORAL | 1 refills | Status: DC

## 2021-04-24 NOTE — Telephone Encounter (Signed)
Ok with me. Please place any necessary orders. 

## 2021-04-24 NOTE — Telephone Encounter (Signed)
Rx sent to pharmacy requested.

## 2021-05-08 NOTE — Telephone Encounter (Signed)
Last Prolia inj 02/26/21 ?Next Prolia inj due 08/27/21 ?

## 2021-05-18 ENCOUNTER — Telehealth: Payer: Self-pay | Admitting: Cardiovascular Disease

## 2021-05-18 NOTE — Telephone Encounter (Signed)
Patient calling to get results from her Zio monitor. Please call back ?

## 2021-05-18 NOTE — Telephone Encounter (Signed)
Informed patient of Dr. Kathalene Frames notation of monitor results. Also told patient to check her MyChart message from Dr. Audie Box ... "Brief extra beats. If still having symptoms we will treat. I will my chart message her."  ?Patient stated she has not felt her heart racing or having dizziness or lightheadedness since she turned in the monitor. Recommended that she call our clinic if she has rapid heart rate, dizziness/lightheadedness. She voiced understanding. ?

## 2021-05-19 ENCOUNTER — Ambulatory Visit: Payer: Medicare PPO | Admitting: Family Medicine

## 2021-05-19 ENCOUNTER — Encounter: Payer: Self-pay | Admitting: Family Medicine

## 2021-05-19 VITALS — BP 132/62 | HR 68 | Temp 97.7°F | Ht 68.0 in | Wt 108.6 lb

## 2021-05-19 DIAGNOSIS — D179 Benign lipomatous neoplasm, unspecified: Secondary | ICD-10-CM | POA: Diagnosis not present

## 2021-05-19 DIAGNOSIS — I471 Supraventricular tachycardia: Secondary | ICD-10-CM | POA: Diagnosis not present

## 2021-05-19 MED ORDER — METHYLPREDNISOLONE ACETATE 40 MG/ML IJ SUSP
40.0000 mg | Freq: Once | INTRAMUSCULAR | Status: AC
  Administered 2021-05-19: 40 mg via INTRALESIONAL

## 2021-05-19 NOTE — Progress Notes (Signed)
? ?  Latoya Wright is a 76 y.o. female who presents today for an office visit. ? ?Assessment/Plan:  ?Chronic Problems Addressed Today: ?Paroxysmal SVT (supraventricular tachycardia) ([place]) ?Recently went to the ED with palpitations.  She follow-up with cardiology.  She was diagnosed with SVT.  She is no longer having symptoms.  We discussed reasons to return to care and seek emergent care.  We also discussed Valsalva maneuvers if she has return of symptoms.  She can follow-up with cardiology if her symptoms are persisting.  Regular rate and rhythm today. ? ?Lipoma ?Intralesional injection performed today.  See below procedure note.  She Toller well.  We can repeat in a month if needed. ? ? ?  ?Subjective:  ?HPI: ? ?Patient here for ED follow up. She presented to the ED on 04/17/2021 with palpitations. She notes she was sleeping when heart racing started. This woke her up from her sleep. Lasted for about 15-20 minutes. She notes her symptoms resolved prior to arriving in the ED. Had workup in the ED which was unremarkable. She has followed up with cardiologist since being discharged from ED. She was recommended to wear monitor for 7 days.  ? ?Heart monitor showed a few runs of SVT.  No mild arrhythmias. ? ?She has been doing well since being discharged. She has had concern that this might be related to stroke. She has not felt her heart racing since stopping the monitor. Denies any lightheadedness or dizziness. Denies any recurrent symptoms.  ? ?She has noticed few new lipoma. Located on right arm. She is requesting cortisone shot for this issue. Had similar issue in last visit. We performed intralesional injection in the past. This has helped.  ? ?   ?  ?Objective:  ?Physical Exam: ?BP 132/62   Pulse 68   Temp 97.7 ?F (36.5 ?C) (Temporal)   Ht '5\' 8"'$  (1.727 m)   Wt 108 lb 9.6 oz (49.3 kg)   SpO2 100%   BMI 16.51 kg/m?   ?Gen: No acute distress, resting comfortably ?CV: Regular rate and rhythm with no murmurs  appreciated ?Pulm: Normal work of breathing, clear to auscultation bilaterally with no crackles, wheezes, or rhonchi ?Skin: Several scattered lipomas on upper extremities bilaterally. ?Neuro: Grossly normal, moves all extremities ?Psych: Normal affect and thought content ? ?Procedure Note ?Verbal consent obtained after discussing risk and benefits.  8 separate lipomas were identified on her right upper extremity.  These areas were marked with sterile marker.  Area was then cleansed with alcohol swab.  Topical ethyl chloride was applied for anesthesia. One ml of a 3-1 mixture of 1% lidocaine without epinephrine and '40mg'$ /cc of Depo-Medrol was then injected into each lesion.  Sterile bandages were placed at the site of each injection.  She tolerated well.  Minimal blood loss. ?  ? ? ?   ? ? ?I,Savera Zaman,acting as a Education administrator for Dimas Chyle, MD.,have documented all relevant documentation on the behalf of Dimas Chyle, MD,as directed by  Dimas Chyle, MD while in the presence of Dimas Chyle, MD.  ? ?I, Dimas Chyle, MD, have reviewed all documentation for this visit. The documentation on 05/19/21 for the exam, diagnosis, procedures, and orders are all accurate and complete. ? ?Algis Greenhouse. Jerline Pain, MD ?05/19/2021 11:45 AM  ? ?

## 2021-05-19 NOTE — Assessment & Plan Note (Signed)
Intralesional injection performed today.  See below procedure note.  She Toller well.  We can repeat in a month if needed. ?

## 2021-05-19 NOTE — Patient Instructions (Signed)
It was very nice to see you today! ? ?We injected the lipomas on your arm.  We can repeat this in about a month if needed. ? ?Please let us know if you have any return of palpitations or any other symptoms.  Please keep an eye on your blood pressure and let us know if it is persistently elevated. ? ?Take care, ?Dr Jerline Pain ? ?PLEASE NOTE: ? ?If you had any lab tests please let us know if you have not heard back within a few days. You may see your results on mychart before we have a chance to review them but we will give you a call once they are reviewed by [place]. If we ordered any referrals today, please let us know if you have not heard from their office within the next week.  ? ?Please try these tips to maintain a healthy lifestyle: ? ?Eat at least 3 REAL meals and 1-2 snacks per day.  Aim for no more than 5 hours between eating.  If you eat breakfast, please do so within one hour of getting up.  ? ?Each meal should contain half fruits/vegetables, one quarter protein, and one quarter carbs (no bigger than a computer mouse) ? ?Cut down on sweet beverages. This includes juice, soda, and sweet tea.  ? ?Drink at least 1 glass of water with each meal and aim for at least 8 glasses per day ? ?Exercise at least 150 minutes every week.   ? ?Supraventricular Tachycardia, Adult ?Supraventricular tachycardia (SVT) is a kind of abnormal heartbeat. It makes your heart beat very fast. This may last for a short time and then return to normal, or it may last longer. ?A normal resting heartbeat is 60-100 times a minute. This condition can make your heart beat more than 150 times a minute. Times of having a fast heartbeat (episodes) can be scary, but they are usually not dangerous. In some cases, they may lead to heart failure if they: ?Happen many times a day. ?Last longer than a few seconds. ?What are the causes? ? ?This condition happens when electrical signals are sent out from areas of the heart that do not normally send signals  for the heartbeat. ?What increases the risk? ?You are more likely to develop this condition if you are: ?Middle aged or younger. ?Female. ?The following factors may also make you more likely to develop this condition: ?Stress. ?Feeling worried or nervous (anxiety). ?Tiredness. ?Smoking. ?Stimulant drugs, such as cocaine and methamphetamine. ?Alcohol. ?Caffeine. ?Pregnancy. ?Having certain medical conditions. ?What are the signs or symptoms? ?A pounding heart. ?A feeling that your heart is skipping beats (palpitations). ?Weakness. ?Trouble getting enough air. ?Pain or tightness in your chest. ?Dizziness or feeling like you are going to pass out (faint). ?Feeling worried or nervous. ?Sweating. ?Feeling like you may vomit (nausea). ?Passing out. ?Tiredness. ?Sometimes, there are no symptoms. ?How is this treated? ?Treatment may include: ?Vagal nerve stimulation. Ways to do this include: ?Holding your breath and pushing, as though you are pooping (having a bowel movement). ?Massaging an area on one side of your neck. Do not try this yourself. Only a doctor should do this. If done the wrong way, it can lead to a stroke. ?Bending forward with your head between your legs. ?Coughing while bending forward with your head between your legs. ?Putting an ice-cold, wet towel on your face. ?Medicines that prevent attacks. ?Medicine to stop an attack given through an IV tube at the hospital. ?A small electric shock (cardioversion)  that stops an attack. ?A procedure to get rid of cells in the area that is causing the fast heartbeats (radiofrequency ablation). ?If you do not have symptoms, you may not need treatment. ?Follow these instructions at home: ?Stress ?Avoid things that make you feel stressed. ?To deal with stress, try: ?Doing yoga or meditation. ?Being out in nature. ?Listening to relaxing music. ?Doing deep breathing. ?Taking steps to be healthy, such as getting lots of sleep, exercising, and eating a balanced  diet. ?Talking with a mental health doctor. ?Lifestyle ? ?Try to get at least 7 hours of sleep each night. ?Do not smoke or use any products that contain nicotine or tobacco. If you need help quitting, ask your doctor. ?Do not drink alcohol if it gives you a fast heartbeat. ?If alcohol does not seem to give you a fast heartbeat, limit your alcohol use. If you drink alcohol: ?Limit how much you have to: ?0-1 drink a day for women who are not pregnant. ?0-2 drinks a day for men. ?Know how much alcohol is in your drink. In the [place] one drink equals one 12 oz bottle of beer (355 mL), one 5 oz glass of wine (148 mL), or one 1? oz glass of hard liquor (44 mL). ?Be aware of how caffeine affects you. ?If caffeine gives you a fast heartbeat, do not eat, drink, or use anything with caffeine in it. ?If caffeine does not seem to give you a fast heartbeat, limit how much caffeine you eat, drink, or use. ?Do not use stimulant drugs. If you need help quitting, ask your doctor. ?General instructions ?Stay at a healthy weight. ?Exercise regularly. Ask your doctor about good activities for you. Try one or a mixture of these: ?150 minutes a week of gentle exercise, like walking or yoga. ?75 minutes a week of exercise that is very active, like running or swimming. ?Do vagus nerve treatments to slow down your heartbeat as told by your doctor. ?Take over-the-counter and prescription medicines only as told by your doctor. ?Keep all follow-up visits. ?Contact a doctor if: ?You have a fast heartbeat more often. ?Times of having a fast heartbeat last longer than before. ?Home treatments to slow down your heartbeat do not help. ?You have new symptoms. ?Get help right away if: ?You have chest pain. ?Your symptoms get worse. ?You have trouble breathing. ?Your heart beats very fast for more than 20 minutes. ?You pass out. ?These symptoms may be an emergency. Get medical help right away. Call your local emergency services (911 in the [place] ?Do  not wait to see if the symptoms will go away. ?Do not drive yourself to the hospital. ?Summary ?SVT is a type of abnormal heartbeat. ?This condition can make your heart beat more than 150 times a minute. ?If you do not have symptoms, you may not need treatment. ?This information is not intended to replace advice given to you by your health care provider. Make sure you discuss any questions you have with your health care provider. ?Document Revised: 08/25/2019 Document Reviewed: 08/25/2019 ?Elsevier Patient Education ? [place]. ? ?

## 2021-05-19 NOTE — Addendum Note (Signed)
Addended by: Betti Cruz on: 05/19/2021 12:03 PM ? ? Modules accepted: Orders ? ?

## 2021-05-19 NOTE — Assessment & Plan Note (Signed)
Recently went to the ED with palpitations.  She follow-up with cardiology.  She was diagnosed with SVT.  She is no longer having symptoms.  We discussed reasons to return to care and seek emergent care.  We also discussed Valsalva maneuvers if she has return of symptoms.  She can follow-up with cardiology if her symptoms are persisting.  Regular rate and rhythm today. ?

## 2021-05-20 NOTE — Telephone Encounter (Signed)
Patient in car going to eye appointment. Will call her back around noon to schedule 62-monthf/u with Dr. OAudie Box ?

## 2021-05-20 NOTE — Telephone Encounter (Signed)
Appointment made with Dr. Audie Box for 9/14 for 52-monthf/u ?

## 2021-06-01 ENCOUNTER — Institutional Professional Consult (permissible substitution): Payer: Medicare PPO | Admitting: Plastic Surgery

## 2021-06-10 ENCOUNTER — Ambulatory Visit: Payer: Medicare PPO | Admitting: Adult Health

## 2021-06-25 ENCOUNTER — Other Ambulatory Visit: Payer: Self-pay | Admitting: Obstetrics & Gynecology

## 2021-06-25 LAB — HM MAMMOGRAPHY

## 2021-06-26 ENCOUNTER — Other Ambulatory Visit: Payer: Self-pay | Admitting: Obstetrics & Gynecology

## 2021-06-26 DIAGNOSIS — Z1231 Encounter for screening mammogram for malignant neoplasm of breast: Secondary | ICD-10-CM

## 2021-07-02 ENCOUNTER — Encounter: Payer: Self-pay | Admitting: Family Medicine

## 2021-07-06 ENCOUNTER — Ambulatory Visit: Payer: Medicare PPO | Admitting: Adult Health

## 2021-07-06 ENCOUNTER — Encounter: Payer: Self-pay | Admitting: Adult Health

## 2021-07-06 VITALS — BP 144/66 | HR 65 | Ht 68.0 in | Wt 105.0 lb

## 2021-07-06 DIAGNOSIS — I639 Cerebral infarction, unspecified: Secondary | ICD-10-CM

## 2021-07-06 NOTE — Patient Instructions (Addendum)
Continue aspirin 81 mg daily  and atorvastatin  for secondary stroke prevention  Continue to follow up with PCP regarding cholesterol and blood pressure management  Maintain strict control of hypertension with blood pressure goal below 130/90 and cholesterol with LDL cholesterol (bad cholesterol) goal below 70 mg/dL.   Signs of a Stroke? Follow the BEFAST method:  Balance Watch for a sudden loss of balance, trouble with coordination or vertigo Eyes Is there a sudden loss of vision in one or both eyes? Or double vision?  Face: Ask the person to smile. Does one side of the face droop or is it numb?  Arms: Ask the person to raise both arms. Does one arm drift downward? Is there weakness or numbness of a leg? Speech: Ask the person to repeat a simple phrase. Does the speech sound slurred/strange? Is the person confused ? Time: If you observe any of these signs, call 911.       Thank you for coming to see us at [facility]. I hope we have been able to provide you high quality care today.  You may receive a patient satisfaction survey over the next few weeks. We would appreciate your feedback and comments so that we may continue to improve ourselves and the health of our patients.  

## 2021-07-06 NOTE — Progress Notes (Signed)
[facility] [address] [place]. [place] [address] [phone]       STROKE FOLLOW UP NOTE  [name] Date of Birth:  Oct 02, 1945 Medical Record Number:  [id number]   Reason for Referral: stroke follow up    SUBJECTIVE:   CHIEF COMPLAINT:  Chief Complaint  Patient presents with   Follow-up    RM 3 with spouse ray  Pt is well and stable, no new concerns     HPI:   Update 07/06/2021 JM: Patient returns for stroke follow-up after prior visit 7 months ago.  She is accompanied by her husband.  Overall stable from stroke standpoint.  Denies new stroke/TIA symptoms.  Reports residual mild right hand weakness interfering with writing at times and occasional word finding difficulty still when trying to speak too quickly.  Goes to the gym 6x/week. Also works with a Physiological scientist who specializes in neurological conditions.  Compliant on aspirin and atorvastatin, denies side effects.  Blood pressure today 144/66. Does monitor at home, typically 110-130s/60-70s.  Did have episode of palpitations back in March, no additional events since then. Did complete cardiac monitor, did have brief run of extra beats, has f/u with Dr. Audie Box in September.  Routinely follows with PCP Dr. Jerline Pain.  No further concerns at this time.      History provided for reference purposes only Initial visit 12/10/2020 JM: Latoya Wright is being seen for initial hospital follow-up accompanied by her husband, Latoya Wright.   Stable since discharge without new or worsening stroke/TIA symptoms. OT - right hand weakness more so affecting writing SLP -mild dysarthria, occasional word finding difficulty/hesitancy if trying to speak too quickly Denies residual RLE weakness or any gait difficulty Does report continued gradual improvement she has essentially returned back to all prior activities.  Able to maintain ADLs and majority of IADLs independently  Completed 3 weeks DAPT -remains on aspirin alone  as well as atorvastatin without side effects.   Blood pressure today 152/70.  Not routinely monitoring at home - was initally monitored while working with [facility] therapies and was stable during that time -typically elevated at appointments  No questions or concerns at this time  Stroke admission 10/16/2020 Latoya Wright is a 76 year old female with no significant past medical history who presented on 10/16/2020 with right-sided weakness and aphasia.  Initially, symptoms improved at the time of presentation to ED but unfortunately while awaiting further evaluation, she began to have recurrent symptoms. Personally reviewed hospitalization pertinent progress notes, lab work and imaging.  Evaluated by Dr. Erlinda Hong.  CT no acute abnormality.  MRI showed left CR and BG infarct.  MRA negative, carotid Doppler negative, EF 60 to 65%.  LDL 121, A1c 5.3, creatinine 0.84.  COVID PCR test positive.  Etiology for stroke felt to be due to small vessel disease.  Recommended aspirin Plavix for 3 weeks and aspirin alone and initiate atorvastatin 40 mg daily.  Other stroke risk factors include advanced age -no prior stroke history.  Therapy eval initially recommended CIR however in setting of significant improvement, rec's chenaged to [facility] therapies.       PERTINENT IMAGING  Code Stroke  CT head No acute abnormality.  Small vessel disease. Atrophy.  ASPECTS 10.     MRI  BRAIN Acute infarct left corona radiata and basal ganglia.  No hemorrhage. Mild chronic microvascular ischemic changes. MRA HEAD No proximal intracranial vessel occlusion or significant stenosis. Carotid Doppler    Right Carotid: Velocities in the right  ICA are consistent with a 1-39%  stenosis.  Left Carotid: Velocities in the left ICA are consistent with a 1-39%  stenosis.  Vertebrals: Bilateral vertebral arteries demonstrate antegrade flow.  2D Echo EF 60 to 65% LDL 121 HgbA1c 5.3    ROS:   14 system review of systems performed and negative with  exception of those listed in HPI  PMH:  Past Medical History:  Diagnosis Date   Allergy    Arthritis    Stroke ([place])     PSH:  Past Surgical History:  Procedure Laterality Date   TOTAL HIP ARTHROPLASTY 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   Procedure: TOTAL HIP ARTHROPLASTY ANTERIOR APPROACH;  Surgeon: Mcarthur Rossetti, MD;  Location: [facility];  Service: Orthopedics;  Laterality: Left;    Social History:  Social History   Socioeconomic History   Marital status: Married    Spouse name: Not on file   Number of children: 2   Years of education: Not on file   Highest education level: Not on file  Occupational History   Occupation: Education officer, museum - Retired  Tobacco Use   Smoking status: Never   Smokeless tobacco: Never  Substance and Sexual Activity   Alcohol use: No   Drug use: No   Sexual activity: Not on file  Other Topics Concern   Not on file  Social History Narrative   Not on file   Social Determinants of Health   Financial Resource Strain: Not on file  Food Insecurity: Not on file  Transportation Needs: Not on file  Physical Activity: Not on file  Stress: Not on file  Social Connections: Not on file  Intimate Partner Violence: Not on file    Family History:  Family History  Problem Relation Age of Onset   Heart disease Father    Heart disease Maternal Grandmother    Heart disease Maternal Grandfather    Heart disease Paternal Grandmother    Heart disease Paternal Grandfather    Rheum arthritis Sister    Rheum arthritis Brother     Medications:   Current Outpatient Medications on File Prior to Visit  Medication Sig Dispense Refill   aspirin EC 81 MG EC tablet Take 1 tablet (81 mg total) by mouth daily. Swallow whole. 30 tablet 11   atorvastatin (LIPITOR) 40 MG tablet Take 1 tablet (40 mg total) by mouth at bedtime. 90 tablet 1   Biotin 5 MG TABS Take 5 mg by mouth daily.     calcium-vitamin D (OSCAL 500/200 D-3) 500-200 MG-UNIT tablet Take 1 tablet by mouth 2  (two) times daily. 180 tablet 3   VITAMIN D PO Take by mouth daily.     No current facility-administered medications on file prior to visit.    Allergies:   Allergies  Allergen Reactions   Dayquil [Pseudoephedrine-Apap-Dm]     jittery      OBJECTIVE:  Physical Exam  Vitals:   07/06/21 1409  BP: (!) 144/66  Pulse: 65  Weight: 105 lb (47.6 kg)  Height: '5\' 8"'$  (1.727 m)   Body mass index is 15.97 kg/m. No results found.   General: Frail very pleasant elderly Caucasian female, seated, in no evident distress Head: head normocephalic and atraumatic.   Neck: supple with no carotid or supraclavicular bruits Cardiovascular: regular rate and rhythm, no murmurs Musculoskeletal: no deformity Skin:  no rash/petichiae Vascular:  Normal pulses all extremities   Neurologic Exam Mental Status: Awake and fully alert. mild dysarthria and intermittent hypophonia and speech hesitancy.  Follows commands without difficulty.  Oriented to place and time. Recent and remote memory intact. Attention span, concentration and fund of knowledge appropriate. Mood and affect appropriate.  Cranial Nerves: Pupils equal, briskly reactive to light. Extraocular movements full without nystagmus. Visual fields full to confrontation. Hearing intact. Facial sensation intact.  Mild right facial asymmetry when smiling.  Tongue and palate moves normally and symmetrically.  Motor: Normal bulk and tone. Normal strength in all tested extremity muscles except slight decrease right hand dexterity Sensory.: intact to touch , pinprick , position and vibratory sensation.  Coordination: Rapid alternating movements normal in all extremities except slight decreased right hand dexterity. Finger-to-nose and heel-to-shin performed accurately bilaterally.  Orbits left arm over right arm Gait and Station: Arises from chair without difficulty. Stance is normal. Gait demonstrates normal stride length and balance without use of AD.  Tandem walk and heel toe without difficulty.  Reflexes: 1+ and symmetric. Toes downgoing.         ASSESSMENT: FLORENCE YEUNG is a 76 y.o. year old female with a left CR and BG infarcts on 10/16/2020 likely secondary to small vessel disease source. Vascular risk factors include HLD and advanced age.      PLAN:  L CR and BG stroke :  Residual deficit: mild decreased right hand dexterity and fine motor control and mild dysarthria with hypophonia.  Completed therapies back in January -encouraged continued exercises at home and ongoing participation with local personal trainer. Continue aspirin 81 mg daily  and atorvastatin 40 mg daily for secondary stroke prevention.   Discussed secondary stroke prevention measures and importance of close PCP follow up for aggressive stroke risk factor management including HLD with LDL goal<70.  Lipid panel 03/2021: LDL 69 I have gone over the pathophysiology of stroke, warning signs and symptoms, risk factors and their management in some detail with instructions to go to the closest emergency room for symptoms of concern.    Doing well from stroke standpoint without further recommendations and risk factors are managed by PCP. She may follow up PRN, as usual for our patients who are strictly being followed for stroke. If any new neurological issues should arise, request PCP place referral for evaluation by one of our neurologists. Thank you.     CC:  PCP: Vivi Barrack, MD    I spent 32 minutes of face-to-face and non-face-to-face time with patient and husband.  This included previsit chart review, lab review, study review, electronic health record documentation, patient and husband education regarding prior stroke with residual deficits, secondary stroke prevention measures and importance of managing stroke risk factors and answered all other questions to patient and husband's satisfaction  [name], AGNP-BC  [facility] [address] [place], [place] [address]  Phone [phone] Fax [phone] Note: This document was prepared with digital dictation and possible smart phrase technology. Any transcriptional errors that result from this process are unintentional.

## 2021-07-17 ENCOUNTER — Telehealth: Payer: Self-pay | Admitting: Family Medicine

## 2021-07-17 NOTE — Telephone Encounter (Signed)
Patient requests a Referral be sent to Dr. Merry Proud at [facility] because the Cortisone shots have not worked ph# [phone]

## 2021-07-20 ENCOUNTER — Other Ambulatory Visit: Payer: Self-pay | Admitting: *Deleted

## 2021-07-20 DIAGNOSIS — D179 Benign lipomatous neoplasm, unspecified: Secondary | ICD-10-CM

## 2021-07-25 NOTE — Telephone Encounter (Signed)
Prolia VOB initiated via [email or web address]  Last Prolia inj 02/26/21 Next Prolia inj due 08/27/21

## 2021-08-02 ENCOUNTER — Encounter: Payer: Self-pay | Admitting: Plastic Surgery

## 2021-08-03 NOTE — Telephone Encounter (Signed)
For the Primary MD Purchase option, benefits for Prolia and administration are undisclosed. [employer] requires the provider to use an online tool to obtain benefits. This includes information regarding prior authorization requirements   PA PROCESS DETAILS: Prior authorization is on file (Authorization # [id number]) and is valid from 02/12/2021 through 01/24/2022. Number of units covered is undisclosed. The prior authorization department can be contacted at [phone].

## 2021-08-11 NOTE — Telephone Encounter (Addendum)
Prior Authorization initiated for PROLIA via [email or web address] KEYLindaann Pascal - [place] Case ID: [id number]

## 2021-08-12 ENCOUNTER — Institutional Professional Consult (permissible substitution): Payer: Medicare PPO | Admitting: Plastic Surgery

## 2021-08-18 NOTE — Telephone Encounter (Signed)
Pt ready for scheduling on or after 08/27/21  Out-of-pocket cost due at time of visit: $0  Primary: [employer] Prolia co-insurance: 0% Admin fee co-insurance: 0%  Secondary: n/a Prolia co-insurance:  Admin fee co-insurance:   Deductible: does not apply  Prior Auth: APPROVED KeyLindaann Pascal - [place] Case ID: [id number] Valid: 02/12/21-01/24/22    ** This summary of benefits is an estimation of the patient's out-of-pocket cost. Exact cost may very based on individual plan coverage.

## 2021-08-18 NOTE — Telephone Encounter (Signed)
KeyLindaann Pascal - [place] Case ID: [id number] Valid: 02/12/21-01/24/22

## 2021-08-19 NOTE — Telephone Encounter (Signed)
Patient scheduled 8/3.

## 2021-08-19 NOTE — Telephone Encounter (Signed)
error 

## 2021-08-24 ENCOUNTER — Ambulatory Visit: Payer: Medicare PPO | Admitting: Plastic Surgery

## 2021-08-24 VITALS — BP 163/74 | HR 74 | Ht 68.0 in | Wt 110.2 lb

## 2021-08-24 DIAGNOSIS — D1722 Benign lipomatous neoplasm of skin and subcutaneous tissue of left arm: Secondary | ICD-10-CM | POA: Diagnosis not present

## 2021-08-24 DIAGNOSIS — D1721 Benign lipomatous neoplasm of skin and subcutaneous tissue of right arm: Secondary | ICD-10-CM | POA: Diagnosis not present

## 2021-08-24 DIAGNOSIS — D1720 Benign lipomatous neoplasm of skin and subcutaneous tissue of unspecified limb: Secondary | ICD-10-CM

## 2021-08-25 NOTE — Progress Notes (Signed)
   Referring Provider Vivi Barrack, MD [address] [place],  [place] [address]   CC:  Lipomas bilateral upper extremity   Latoya Wright is an 76 y.o. female.  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  She is interested in excision.  She has had excision prior by Dr. Dessie Coma.    Allergies  Allergen Reactions   Dayquil [Pseudoephedrine-Apap-Dm]     jittery    Outpatient Encounter Medications as of 08/24/2021  Medication Sig   aspirin EC 81 MG EC tablet Take 1 tablet (81 mg total) by mouth daily. Swallow whole.   atorvastatin (LIPITOR) 40 MG tablet Take 1 tablet (40 mg total) by mouth at bedtime.   Biotin 5 MG TABS Take 5 mg by mouth daily.   calcium-vitamin D (OSCAL 500/200 D-3) 500-200 MG-UNIT tablet Take 1 tablet by mouth 2 (two) times daily.   VITAMIN D PO Take by mouth daily.   No facility-administered encounter medications on file as of 08/24/2021.     Past Medical History:  Diagnosis Date   Allergy    Arthritis    Stroke [facility])     Past Surgical History:  Procedure Laterality Date   TOTAL HIP ARTHROPLASTY 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   Procedure: TOTAL HIP ARTHROPLASTY ANTERIOR APPROACH;  Surgeon: Mcarthur Rossetti, MD;  Location: [facility];  Service: Orthopedics;  Laterality: Left;    Family History  Problem Relation Age of Onset   Heart disease Father    Heart disease Maternal Grandmother    Heart disease Maternal Grandfather    Heart disease Paternal Grandmother    Heart disease Paternal Grandfather    Rheum arthritis Sister    Rheum arthritis Brother     Social History   Social History Narrative   Not on file     Review of Systems General: Denies fevers, chills, weight loss CV: Denies chest pain, shortness of breath, palpitations   Physical Exam    08/24/2021    1:21 PM 07/06/2021    2:09 PM 05/19/2021   11:39 AM  Vitals with BMI  Height '5\' 8"'$  '5\' 8"'$    Weight 110 lbs 3 oz 105 lbs   BMI 29.79 89.21    Systolic 194 174 081  Diastolic 74 66 62  Pulse 74 65     General:  No acute distress,  Alert and oriented, Non-Toxic, Normal speech and affect Extremity: 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  5-10 lesions at least on both arms.  Assessment/Plan Recommend excision.  The patient has had in office before and we will continue this.  We discussed excision of the 5 most bothersome lesions on her right arm to start with.    Lennice Sites 08/25/2021, 8:19 AM

## 2021-08-27 ENCOUNTER — Encounter: Payer: Self-pay | Admitting: *Deleted

## 2021-08-27 ENCOUNTER — Ambulatory Visit (INDEPENDENT_AMBULATORY_CARE_PROVIDER_SITE_OTHER): Payer: Medicare PPO | Admitting: *Deleted

## 2021-08-27 DIAGNOSIS — M810 Age-related osteoporosis without current pathological fracture: Secondary | ICD-10-CM | POA: Diagnosis not present

## 2021-08-27 MED ORDER — DENOSUMAB 60 MG/ML ~~LOC~~ SOSY
60.0000 mg | PREFILLED_SYRINGE | Freq: Once | SUBCUTANEOUS | Status: AC
  Administered 2021-08-27: 60 mg via SUBCUTANEOUS

## 2021-08-27 NOTE — Progress Notes (Signed)
I have reviewed the patient's encounter and agree with the documentation.  Latoya Wright. Jerline Pain, MD 08/27/2021 9:28 AM

## 2021-08-27 NOTE — Progress Notes (Signed)
Per orders of Dr. Jerline Pain, injection of Prolia 60 mg given subcutaneous by Anselmo Pickler, LPN in left arm Patient tolerated injection well. Patient will make appointment for 70month

## 2021-09-21 NOTE — Telephone Encounter (Signed)
Last Prolia inj 08/27/21 Next Prolia inj due 02/28/22

## 2021-10-07 NOTE — Progress Notes (Signed)
Cardiology Office Note:   Date:  10/08/2021  NAME:  [name]    MRN: [id number] DOB:  04/04/1945   PCP:  Vivi Barrack, MD  Cardiologist:  None  Electrophysiologist:  None   Referring MD: Vivi Barrack, MD   Chief Complaint  Patient presents with   Follow-up        History of Present Illness:   Latoya Wright is a 76 y.o. female with a hx of CVA, hypertension, hyperlipidemia, brief SVT who presents for follow-up.  She wore a monitor earlier this year that showed brief SVT.  No further symptoms.  Suspect stress could have been contributing.  She seems to be doing well.  She is going to the gym 6 days/week.  No chest pains or trouble breathing.  Her repeat lipid profile from her primary care physician shows good cholesterol control.  She is on Lipitor and aspirin.  Overall without any other strokelike symptoms.  She seems to be doing quite well.  Problem List CVA, lacunar 09/2020 -L corona radiata, L basal ganglia infarct (lacunar) 2. HTN 3. HLD -T chol 180, HDL 97, LDL 72, TG 59 4. SVT -9.4 seconds duration   Past Medical History: Past Medical History:  Diagnosis Date   Allergy    Arthritis    Stroke [facility])     Past Surgical History: Past Surgical History:  Procedure Laterality Date   TOTAL HIP ARTHROPLASTY 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   Procedure: TOTAL HIP ARTHROPLASTY ANTERIOR APPROACH;  Surgeon: Mcarthur Rossetti, MD;  Location: [facility];  Service: Orthopedics;  Laterality: Left;    Current Medications: Current Meds  Medication Sig   aspirin EC 81 MG EC tablet Take 1 tablet (81 mg total) by mouth daily. Swallow whole.   Biotin 5 MG TABS Take 5 mg by mouth daily.   calcium-vitamin D (OSCAL 500/200 D-3) 500-200 MG-UNIT tablet Take 1 tablet by mouth 2 (two) times daily.   VITAMIN D PO Take by mouth daily.   [DISCONTINUED] atorvastatin (LIPITOR) 40 MG tablet Take 1 tablet (40 mg total) by mouth at bedtime.     Allergies:    Dayquil [pseudoephedrine-apap-dm]   Social  History: Social History   Socioeconomic History   Marital status: Married    Spouse name: Not on file   Number of children: 2   Years of education: Not on file   Highest education level: Not on file  Occupational History   Occupation: Education officer, museum - Retired  Tobacco Use   Smoking status: Never   Smokeless tobacco: Never  Substance and Sexual Activity   Alcohol use: No   Drug use: No   Sexual activity: Not on file  Other Topics Concern   Not on file  Social History Narrative   Not on file   Social Determinants of Health   Financial Resource Strain: Not on file  Food Insecurity: Not on file  Transportation Needs: Not on file  Physical Activity: Not on file  Stress: Not on file  Social Connections: Not on file     Family History: The patient's family history includes Heart disease in her father, maternal grandfather, maternal grandmother, paternal grandfather, and paternal grandmother; Rheum arthritis in her brother and sister.  ROS:   All other ROS reviewed and negative. Pertinent positives noted in the HPI.     EKGs/Labs/Other Studies Reviewed:   The following studies were personally reviewed by me today:  TTE 10/17/2020  1. Left ventricular ejection fraction, by estimation, is  60 to 65%. The  left ventricle has normal function. The left ventricle has no regional  wall motion abnormalities. Left ventricular diastolic parameters are  indeterminate.   2. Right ventricular systolic function is normal. The right ventricular  size is normal. There is normal pulmonary artery systolic pressure.   3. The mitral valve is grossly normal. Trivial mitral valve  regurgitation. No evidence of mitral stenosis.   4. The aortic valve was not well visualized. Aortic valve regurgitation  is not visualized. No aortic stenosis is present.   [place] 04/17/2021 Impression: 1. Brief supraventricular tachycardia detected (13 episodes in 6 days; longest episode 9.4 seconds).  2. Rare  ectopy.   Recent Labs: 10/14/2020: TSH 0.94 10/19/2020: ALT 15 04/17/2021: BUN 27; Creatinine, Ser 0.77; Hemoglobin 13.4; Platelets 230; Potassium 3.8; Sodium 145   Recent Lipid Panel    Component Value Date/Time   CHOL 182 04/15/2021 0813   TRIG 54 04/15/2021 0813   HDL 102 04/15/2021 0813   CHOLHDL 1.8 04/15/2021 0813   CHOLHDL 2.5 10/17/2020 0325   VLDL 6 10/17/2020 0325   LDLCALC 69 04/15/2021 0813    Physical Exam:   VS:  BP (!) 148/78   Pulse 74   Ht '5\' 8"'$  (1.727 m)   Wt 111 lb 9.6 oz (50.6 kg)   SpO2 99%   BMI 16.97 kg/m    Wt Readings from Last 3 Encounters:  10/08/21 111 lb 9.6 oz (50.6 kg)  08/24/21 110 lb 3.2 oz (50 kg)  07/06/21 105 lb (47.6 kg)    General: Well nourished, well developed, in no acute distress Head: Atraumatic, normal size  Eyes: PEERLA, EOMI  Neck: Supple, no JVD Endocrine: No thryomegaly Cardiac: Normal S1, S2; RRR; no murmurs, rubs, or gallops Lungs: Clear to auscultation bilaterally, no wheezing, rhonchi or rales  Abd: Soft, nontender, no hepatomegaly  Ext: No edema, pulses 2+ Musculoskeletal: No deformities, BUE and BLE strength normal and equal Skin: Warm and dry, no rashes   Neuro: Alert and oriented to person, place, time, and situation, CNII-XII grossly intact, no focal deficits  Psych: Normal mood and affect   ASSESSMENT:   Latoya Wright is a 76 y.o. female who presents for the following: 1. SVT (supraventricular tachycardia) ([place])   2. Intracranial atherosclerosis   3. Mixed hyperlipidemia     PLAN:   1. SVT (supraventricular tachycardia) ([facility]) -Brief SVT captured on monitor.  No further symptoms.  Does not need treatment.  We will go with a conservative approach for now.  2. Intracranial atherosclerosis 3. Mixed hyperlipidemia -History of lacunar infarct due to small vessel disease.  Not cardioembolic.  No A-fib captured on her monitor.  Would recommend to continue aspirin as well as aggressive lipid-lowering agents.  Most  recent LDL is close to 70.  Her HDL is exceedingly high which is a good thing.  Disposition: Return in about 1 year (around 10/09/2022).  Medication Adjustments/Labs and Tests Ordered: Current medicines are reviewed at length with the patient today.  Concerns regarding medicines are outlined above.  No orders of the defined types were placed in this encounter.  Meds ordered this encounter  Medications   atorvastatin (LIPITOR) 40 MG tablet    Sig: Take 1 tablet (40 mg total) by mouth at bedtime.    Dispense:  90 tablet    Refill:  3    Patient Instructions  Medication Instructions:  The current medical regimen is effective;  continue present plan and medications.  *If you  need a refill on your cardiac medications before your next appointment, please call your pharmacy*   Follow-Up: At [facility], you and your health needs are our priority.  As part of our continuing mission to provide you with exceptional heart care, we have created designated Provider Care Teams.  These Care Teams include your primary Cardiologist (physician) and Advanced Practice Providers (APPs -  Physician Assistants and Nurse Practitioners) who all work together to provide you with the care you need, when you need it.  We recommend signing up for the patient portal called "MyChart".  Sign up information is provided on this After Visit Summary.  MyChart is used to connect with patients for Virtual Visits (Telemedicine).  Patients are able to view lab/test results, encounter notes, upcoming appointments, etc.  Non-urgent messages can be sent to your provider as well.   To learn more about what you can do with MyChart, go to [email or web address].    Your next appointment:   12 month(s)  The format for your next appointment:   In Person  Provider:   Eleonore Chiquito, MD or Sande Rives PA-C, or Almyra Deforest, PA-C        Time Spent with Patient: I have spent a total of 25 minutes with patient  reviewing hospital notes, telemetry, EKGs, labs and examining the patient as well as establishing an assessment and plan that was discussed with the patient.  > 50% of time was spent in direct patient care.  Signed, Addison Naegeli. Audie Box, MD, [place]  [address] [place], [place] [address] [phone]  10/08/2021 2:48 PM

## 2021-10-08 ENCOUNTER — Encounter: Payer: Self-pay | Admitting: Cardiovascular Disease

## 2021-10-08 ENCOUNTER — Ambulatory Visit: Payer: Medicare PPO | Attending: Cardiovascular Disease | Admitting: Cardiovascular Disease

## 2021-10-08 VITALS — BP 148/78 | HR 74 | Ht 68.0 in | Wt 111.6 lb

## 2021-10-08 DIAGNOSIS — I672 Cerebral atherosclerosis: Secondary | ICD-10-CM | POA: Diagnosis not present

## 2021-10-08 DIAGNOSIS — E782 Mixed hyperlipidemia: Secondary | ICD-10-CM | POA: Diagnosis not present

## 2021-10-08 DIAGNOSIS — I471 Supraventricular tachycardia: Secondary | ICD-10-CM

## 2021-10-08 MED ORDER — ATORVASTATIN CALCIUM 40 MG PO TABS: 40.0000 mg | ORAL_TABLET | Freq: Every day | ORAL | 3 refills | Status: DC

## 2021-10-08 NOTE — Patient Instructions (Addendum)
Medication Instructions:  The current medical regimen is effective;  continue present plan and medications.  *If you need a refill on your cardiac medications before your next appointment, please call your pharmacy*   Follow-Up: At [facility], you and your health needs are our priority.  As part of our continuing mission to provide you with exceptional heart care, we have created designated Provider Care Teams.  These Care Teams include your primary Cardiologist (physician) and Advanced Practice Providers (APPs -  Physician Assistants and Nurse Practitioners) who all work together to provide you with the care you need, when you need it.  We recommend signing up for the patient portal called "MyChart".  Sign up information is provided on this After Visit Summary.  MyChart is used to connect with patients for Virtual Visits (Telemedicine).  Patients are able to view lab/test results, encounter notes, upcoming appointments, etc.  Non-urgent messages can be sent to your provider as well.   To learn more about what you can do with MyChart, go to [email or web address].    Your next appointment:   12 month(s)  The format for your next appointment:   In Person  Provider:   Eleonore Chiquito, MD or [facility] PA-C, or Almyra Deforest, [place]

## 2021-10-12 ENCOUNTER — Ambulatory Visit: Payer: Medicare PPO | Admitting: Plastic Surgery

## 2021-10-19 ENCOUNTER — Encounter: Payer: Self-pay | Admitting: *Deleted

## 2021-12-21 ENCOUNTER — Ambulatory Visit: Payer: Medicare PPO | Admitting: Family Medicine

## 2021-12-21 ENCOUNTER — Telehealth: Payer: Self-pay | Admitting: Family Medicine

## 2021-12-21 NOTE — Telephone Encounter (Signed)
Spoke with patient, stated doing good had stroke last year, no symptoms now.  Will call back to reschedule OV, unable to came on 11/28/52023 that's why she cancelled OV

## 2021-12-21 NOTE — Telephone Encounter (Signed)
Can we clarify? I just saw her husband this morning. Is she going to the hospital?   Algis Greenhouse. Jerline Pain, MD 12/21/2021 12:45 PM

## 2021-12-21 NOTE — Telephone Encounter (Signed)
Pt states: -calling to cancel appointment for 12/22/21 with PCP about arthritis because she has had a stroke.  -Has difficulty speaking and requested patience.   No further information was given.   No follow up requested.

## 2021-12-21 NOTE — Telephone Encounter (Signed)
See note

## 2021-12-22 ENCOUNTER — Ambulatory Visit: Payer: Medicare PPO | Admitting: Family Medicine

## 2022-01-06 NOTE — Telephone Encounter (Signed)
Patient states: - She had prolia injection on 08/27/21 and is due for next one on 02/27/21  Can patient go ahead be scheduled for prolia?

## 2022-01-07 ENCOUNTER — Encounter: Payer: Self-pay | Admitting: *Deleted

## 2022-01-07 NOTE — Telephone Encounter (Signed)
Forwarding to RX Prior Auth Team 

## 2022-01-08 ENCOUNTER — Other Ambulatory Visit (HOSPITAL_COMMUNITY): Payer: Self-pay

## 2022-01-08 NOTE — Telephone Encounter (Signed)
Prolia VOB initiated via [email or web address] 

## 2022-01-08 NOTE — Telephone Encounter (Signed)
Pt ready for scheduling on or after 02/28/2022  Out-of-pocket cost due at time of visit: $40.00  Primary: [employer] Prolia co-insurance:  Admin fee co-insurance: $40.00  Secondary:  Prolia co-insurance:  Admin fee co-insurance:   Deductible: n/a  Prior Auth:  PA# Valid:     ** This summary of benefits is an estimation of the patient's out-of-pocket cost. Exact cost may very based on individual plan coverage.

## 2022-01-14 ENCOUNTER — Ambulatory Visit: Payer: Medicare PPO

## 2022-01-19 ENCOUNTER — Ambulatory Visit: Payer: Medicare PPO | Admitting: Plastic Surgery

## 2022-01-19 DIAGNOSIS — D1722 Benign lipomatous neoplasm of skin and subcutaneous tissue of left arm: Secondary | ICD-10-CM | POA: Diagnosis not present

## 2022-01-19 DIAGNOSIS — D1721 Benign lipomatous neoplasm of skin and subcutaneous tissue of right arm: Secondary | ICD-10-CM | POA: Diagnosis not present

## 2022-01-19 DIAGNOSIS — Z86718 Personal history of other venous thrombosis and embolism: Secondary | ICD-10-CM

## 2022-01-19 DIAGNOSIS — D179 Benign lipomatous neoplasm, unspecified: Secondary | ICD-10-CM

## 2022-01-19 NOTE — Progress Notes (Signed)
Patient ID: Latoya Wright, female    DOB: Feb 09, 1945, 76 y.o.   MRN: [id number]   Chief Complaint  Patient presents with   Follow-up    The patient is a very sweet 76 year old female here for evaluation of her arms.  She has had lipomatosis for a number of years.  She has gone through seasons of having lipomas serially removed from her arms when they get larger or painful.  She is a longstanding patient of Dr. Bernette Redbird.  She is otherwise in very good health.  Her last hemoglobin A1c was a year ago and 5.3.  She does have supraventricular tachycardia and adjustment disorder.  She has had a hip arthroplasty.  She has a history of a stroke as well.  The involved areas are mostly on the forearm of her right and left arms.  She is not bothered by the scars.    Review of Systems  Constitutional: Negative.   HENT: Negative.    Eyes: Negative.   Respiratory: Negative.    Cardiovascular: Negative.   Gastrointestinal: Negative.   Endocrine: Negative.   Genitourinary: Negative.   Musculoskeletal: Negative.   Skin:  Positive for color change. Negative for rash and wound.  Hematological:  Bruises/bleeds easily.  Psychiatric/Behavioral: Negative.      Past Medical History:  Diagnosis Date   Allergy    Arthritis    Stroke [facility])     Past Surgical History:  Procedure Laterality Date   TOTAL HIP ARTHROPLASTY 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   Procedure: TOTAL HIP ARTHROPLASTY ANTERIOR APPROACH;  Surgeon: Mcarthur Rossetti, MD;  Location: [facility] ORS;  Service: Orthopedics;  Laterality: Left;      Current Outpatient Medications:    aspirin EC 81 MG EC tablet, Take 1 tablet (81 mg total) by mouth daily. Swallow whole., Disp: 30 tablet, Rfl: 11   atorvastatin (LIPITOR) 40 MG tablet, Take 1 tablet (40 mg total) by mouth at bedtime., Disp: 90 tablet, Rfl: 3   Biotin 5 MG TABS, Take 5 mg by mouth daily., Disp: , Rfl:    VITAMIN D PO, Take by mouth daily., Disp: , Rfl:    calcium-vitamin D (OSCAL 500/200  D-3) 500-200 MG-UNIT tablet, Take 1 tablet by mouth 2 (two) times daily., Disp: 180 tablet, Rfl: 3   Objective:   There were no vitals filed for this visit.  Physical Exam Vitals reviewed.  Constitutional:      Appearance: Normal appearance.  HENT:     Head: Normocephalic and atraumatic.  Cardiovascular:     Rate and Rhythm: Normal rate.     Pulses: Normal pulses.  Pulmonary:     Effort: Pulmonary effort is normal.  Musculoskeletal:        General: Tenderness present. No swelling or deformity.  Skin:    Capillary Refill: Capillary refill takes less than 2 seconds.     Coloration: Skin is not jaundiced or pale.     Findings: Bruising and lesion present.  Neurological:     Mental Status: She is alert and oriented to person, place, and time.  Psychiatric:        Mood and Affect: Mood normal.        Behavior: Behavior normal.        Thought Content: Thought content normal.        Judgment: Judgment normal.     Assessment & Plan:  Lipoma, unspecified site  Patient is a good candidate for removal of the painful lipomas on her upper  extremities.  I recommend 1 arm at a time.  I also recommend that she get a compression sleeve to help with swelling and bruising for after the procedure.  We will plan to do this in the office.  Pictures were obtained of the patient and placed in the chart with the patient's or guardian's permission.   Ross, DO

## 2022-01-21 ENCOUNTER — Telehealth: Payer: Self-pay

## 2022-01-21 NOTE — Telephone Encounter (Signed)
[place], spoke with Rob Bunting. Reference #[id number] CPT codes 25075, 25071, 25076, and 25073 do not require PA if performed in the office as well as no limitations in the amount lipomas  removed during one session/procedure.

## 2022-02-11 NOTE — Telephone Encounter (Signed)
Pls note below prolia scheduled

## 2022-02-11 NOTE — Telephone Encounter (Signed)
Noted  

## 2022-02-15 NOTE — Telephone Encounter (Signed)
2024 Prolia VOB initiated via [email or web address]  Last OV:  Next OV:  Last Prolia inj: 08/27/21 Next Prolia inj DUE: 02/28/22

## 2022-02-24 NOTE — Telephone Encounter (Signed)
Prior auth required for PROLIA  PA PROCESS DETAILS: PA is required. PA can be initiated by calling [phone] or online at [email or web address]

## 2022-02-26 NOTE — Telephone Encounter (Signed)
Prior Authorization initiated for PROLIA via [email or web address] KEYMarybelle Killings, PA# [id number]

## 2022-03-02 ENCOUNTER — Ambulatory Visit (INDEPENDENT_AMBULATORY_CARE_PROVIDER_SITE_OTHER): Payer: Medicare PPO

## 2022-03-02 DIAGNOSIS — M810 Age-related osteoporosis without current pathological fracture: Secondary | ICD-10-CM

## 2022-03-02 MED ORDER — DENOSUMAB 60 MG/ML ~~LOC~~ SOSY
60.0000 mg | PREFILLED_SYRINGE | Freq: Once | SUBCUTANEOUS | Status: AC
  Administered 2022-03-02: 60 mg via SUBCUTANEOUS

## 2022-03-02 NOTE — Progress Notes (Addendum)
Pt received Prolia  Subcutaneous In back of right arm. Pt tolerated well.

## 2022-03-03 NOTE — Telephone Encounter (Signed)
PA# [id number] Valid:02/12/21-01/25/23

## 2022-03-22 ENCOUNTER — Encounter: Payer: Self-pay | Admitting: Family Medicine

## 2022-03-22 ENCOUNTER — Ambulatory Visit (INDEPENDENT_AMBULATORY_CARE_PROVIDER_SITE_OTHER): Payer: Medicare PPO | Admitting: Family Medicine

## 2022-03-22 VITALS — BP 134/74 | HR 67 | Temp 98.5°F | Ht 68.0 in | Wt 109.8 lb

## 2022-03-22 DIAGNOSIS — B950 Streptococcus, group A, as the cause of diseases classified elsewhere: Secondary | ICD-10-CM | POA: Diagnosis not present

## 2022-03-22 DIAGNOSIS — U071 COVID-19: Secondary | ICD-10-CM

## 2022-03-22 LAB — POC COVID19 BINAXNOW: SARS Coronavirus 2 Ag: NEGATIVE

## 2022-03-22 LAB — POCT RAPID STREP A (OFFICE): Rapid Strep A Screen: POSITIVE — AB

## 2022-03-22 MED ORDER — AMOXICILLIN 875 MG PO TABS: 875.0000 mg | ORAL_TABLET | Freq: Two times a day (BID) | ORAL | 0 refills | Status: DC

## 2022-03-22 MED ORDER — MOLNUPIRAVIR 200 MG PO CAPS: 4.0000 | ORAL_CAPSULE | Freq: Two times a day (BID) | ORAL | 0 refills | Status: AC

## 2022-03-22 NOTE — Progress Notes (Signed)
   Latoya Wright is a 77 y.o. female who presents today for an office visit.  Assessment/Plan:  New/Acute Problems: Group A Strep  Patient's rapid strep positive though not currently having any symptoms.  Husband was recently diagnosed with COVID and strep.  It is possible that her result could be a false positive or she could be a chronic carrier.  Will send a pocket prescription in for amoxicillin with her to not start unless she develops symptoms.  COVID exposure Rapid COVID-negative and she is currently asymptomatic.  Will send prescription for molnupiravir with instructions started last she develops symptoms or signs of COVID.  We discussed reasons return to care.  Elevated blood pressure Initially elevated however at goal per JNC 8.  Improved on recheck. They can continue to monitor at home.  We discussed reasons return to care.  Chronic Problems Addressed Today: History of CVA On atorvastatin-we will need to avoid paxlovid.     Subjective:  HPI:  Patient initially here today with her husband for hospital follow-up.  He had a positive COVID and strep test and she requested that we check her.  She is not currently having any symptoms.  No sore throat.  No cough or sneeze.  No fevers or chills.       Objective:  Physical Exam: BP 134/74   Pulse 67   Temp 98.5 F (36.9 C) (Oral)   Ht 5' 8"$  (1.727 m)   Wt 109 lb 12.8 oz (49.8 kg)   SpO2 99%   BMI 16.70 kg/m   Gen: No acute distress, resting comfortably CV: Regular rate and rhythm with no murmurs appreciated Pulm: Normal work of breathing, clear to auscultation bilaterally with no crackles, wheezes, or rhonchi Neuro: Grossly normal, moves all extremities Psych: Normal affect and thought content      Teka Chanda M. Jerline Pain, MD 03/22/2022 11:49 AM

## 2022-03-22 NOTE — Patient Instructions (Signed)
It was very nice to see you today!  We will send in amoxicillin and molnupiravir.  This was treated strep and COVID.  Please do not start unless you develops symptoms.  Take care, Dr Jerline Pain  PLEASE NOTE:  If you had any lab tests, please let us know if you have not heard back within a few days. You may see your results on mychart before we have a chance to review them but we will give you a call once they are reviewed by [place].   If we ordered any referrals today, please let us know if you have not heard from their office within the next week.   If you had any urgent prescriptions sent in today, please check with the pharmacy within an hour of our visit to make sure the prescription was transmitted appropriately.   Please try these tips to maintain a healthy lifestyle:  Eat at least 3 REAL meals and 1-2 snacks per day.  Aim for no more than 5 hours between eating.  If you eat breakfast, please do so within one hour of getting up.   Each meal should contain half fruits/vegetables, one quarter protein, and one quarter carbs (no bigger than a computer mouse)  Cut down on sweet beverages. This includes juice, soda, and sweet tea.   Drink at least 1 glass of water with each meal and aim for at least 8 glasses per day  Exercise at least 150 minutes every week.

## 2022-04-01 ENCOUNTER — Other Ambulatory Visit (HOSPITAL_COMMUNITY): Payer: Self-pay

## 2022-04-27 ENCOUNTER — Other Ambulatory Visit (HOSPITAL_COMMUNITY)
Admission: RE | Admit: 2022-04-27 | Discharge: 2022-04-27 | Disposition: A | Discharge status: Home / Self Care | Payer: Medicare PPO | Source: Ambulatory Visit | Attending: Plastic Surgery | Admitting: Plastic Surgery

## 2022-04-27 ENCOUNTER — Encounter: Payer: Self-pay | Admitting: Plastic Surgery

## 2022-04-27 ENCOUNTER — Ambulatory Visit: Payer: Medicare PPO | Admitting: Plastic Surgery

## 2022-04-27 VITALS — BP 185/72 | HR 70

## 2022-04-27 DIAGNOSIS — D179 Benign lipomatous neoplasm, unspecified: Secondary | ICD-10-CM

## 2022-04-27 DIAGNOSIS — D1721 Benign lipomatous neoplasm of skin and subcutaneous tissue of right arm: Secondary | ICD-10-CM | POA: Diagnosis not present

## 2022-04-27 NOTE — Progress Notes (Addendum)
Procedure Note  Preoperative Dx: right arm mass x 2 (proximal and distal)  Postoperative Dx: Same  Procedure: excision of right arm mass under the skin Proximal 2 x 3 cm 2.   Distal 2 x 3 cm  Anesthesia: Lidocaine 1% with 1:100,000 epinephrine  Indication for Procedure: lesion right arm x 2  Description of Procedure: Risks and complications were explained to the patient.  Consent was confirmed and the patient understands the risks and benefits.  The potential complications and alternatives were explained and the patient consents.  The patient expressed understanding the option of not having the procedure and the risks of a scar.  Time out was called and all information was confirmed to be correct.    The area was prepped and drapped.  Lidocaine 1% with epinephrine was injected in the subcutaneous area.   Proximal:  After waiting several minutes for the local to take affect a #15 blade was used to incise the skin over the area.  The 2 x 3 cm lesion was removed and appeared to be consistent with a lipoma. The skin edges were reapproximated with 5-0 Monocryl.  A dressing was applied.    Distal:  After waiting several minutes for the local to take affect a #15 blade was used to incise the skin over the area.  The 2 x 3 cm lesion was removed and appeared to be consistent with a lipoma. The skin edges were reapproximated with 5-0 Monocryl.  A dressing was applied.  The patient was given instructions on how to care for the area and a follow up appointment.  Latoya Wright tolerated the procedure well and there were no complications. The specimens were to pathology.

## 2022-04-29 LAB — SURGICAL PATHOLOGY

## 2022-05-03 ENCOUNTER — Telehealth: Payer: Self-pay | Admitting: *Deleted

## 2022-05-03 NOTE — Telephone Encounter (Signed)
I spoke with Latoya Wright per Dr. Kittie Plater request.and let her know her surgical pathology results were benign  She had no questions and voiced thanks for the call.  Collected: 04/27/22 0942  Lab status: Final-Edited  Resulting lab: [facility]  Value: SURGICAL PATHOLOGY CASE: [id number] PATIENT: [name] Surgical Pathology Report     Clinical History: Proximal right arm mass (ms)     FINAL MICROSCOPIC DIAGNOSIS:  A. SOFT, TISSUE, PROXIMAL RIGHT ARM, BIOPSY: - Angiolipoma  B. SOFT, TISSUE, DISTAL RIGHT ARM, BIOPSY: - Angiolipoma

## 2022-05-05 ENCOUNTER — Telehealth: Payer: Self-pay | Admitting: Family Medicine

## 2022-05-05 NOTE — Telephone Encounter (Signed)
Copied from CRM [id number]. Topic: Medicare AWV >> May 05, 2022 12:01 PM Gwenith Spitz wrote: Reason for CRM: Called patient to schedule Medicare Annual Wellness Visit (AWV). Left message for patient to call back and schedule Medicare Annual Wellness Visit (AWV).  Last date of AWV: N/A  Please schedule an appointment at any time with Inetta Fermo, [facility]. Please schedule AWVS with Inetta Fermo, [facility].  If any questions, please contact me at [phone].  Thank you ,  Gabriel Cirri [facility] AWV TEAM Direct Dial [phone]

## 2022-05-07 ENCOUNTER — Ambulatory Visit (INDEPENDENT_AMBULATORY_CARE_PROVIDER_SITE_OTHER): Payer: Medicare PPO | Admitting: Surgical

## 2022-05-07 DIAGNOSIS — D1720 Benign lipomatous neoplasm of skin and subcutaneous tissue of unspecified limb: Secondary | ICD-10-CM

## 2022-05-07 DIAGNOSIS — D1721 Benign lipomatous neoplasm of skin and subcutaneous tissue of right arm: Secondary | ICD-10-CM

## 2022-05-07 DIAGNOSIS — D179 Benign lipomatous neoplasm, unspecified: Secondary | ICD-10-CM

## 2022-05-07 NOTE — Progress Notes (Signed)
Patient is a 77 year old female here for follow-up after excision of right arm masses with Dr. Ulice Bold on 04/27/2022.  She is 10 days postop.  Pathology showed angiolipomas.  She reports that she has a significant number of lipomas on her arms which she has had removed in the past.  She reports that everything is healing well.  She is not having any issues.  She is interested in having additional lipomas removed on her left and right arm if possible.   On exam right forearm incisions are intact and healing well.  There is no erythema or cellulitic changes noted.  There is no subcutaneous fluid collection noted.  No tenderness noted.    We will plan to see her back as needed in regard to the recent excisions.  Will submit to insurance for additional excisions of lipomas of the left arm.  Discussed this with Dr. Ulice Bold

## 2022-05-13 ENCOUNTER — Telehealth: Payer: Self-pay | Admitting: Plastic Surgery

## 2022-05-13 NOTE — Telephone Encounter (Signed)
Patient would like a call back from PA to see when she can have the other lipomas took off.

## 2022-06-10 ENCOUNTER — Telehealth: Payer: Self-pay | Admitting: Family Medicine

## 2022-06-10 NOTE — Telephone Encounter (Signed)
Contacted Lyndee Hensen to schedule their annual wellness visit. Call back at later date: 06/14/2022  Gabriel Cirri [facility] AWV TEAM Direct Dial [phone]

## 2022-06-28 LAB — HM MAMMOGRAPHY

## 2022-07-01 ENCOUNTER — Ambulatory Visit (INDEPENDENT_AMBULATORY_CARE_PROVIDER_SITE_OTHER): Payer: Medicare PPO

## 2022-07-01 VITALS — BP 122/74 | HR 65 | Temp 98.0°F | Wt 108.6 lb

## 2022-07-01 DIAGNOSIS — Z0000 Encounter for general adult medical examination without abnormal findings: Secondary | ICD-10-CM

## 2022-07-01 NOTE — Patient Instructions (Signed)
[name] , Thank you for taking time to come for your Medicare Wellness Visit. I appreciate your ongoing commitment to your health goals. Please review the following plan we discussed and let me know if I can assist you in the future.   These are the goals we discussed:  Goals   None     This is a list of the screening recommended for you and due dates:  Health Maintenance  Topic Date Due   Hepatitis C Screening  Never done   Zoster (Shingles) Vaccine (2 of 2) 10/16/2020   DEXA scan (bone density measurement)  08/12/2022   Flu Shot  08/26/2022   Mammogram  06/28/2023   Medicare Annual Wellness Visit  07/01/2023   DTaP/Tdap/Td vaccine (3 - Td or Tdap) 08/22/2030   Pneumonia Vaccine  Completed   HPV Vaccine  Aged Out   Colon Cancer Screening  Discontinued   COVID-19 Vaccine  Discontinued    Advanced directives: Please bring a copy of your health care power of attorney and living will to the office at your convenience.   Conditions/risks identified: stay healthy   Next appointment: Follow up in one year for your annual wellness visit    Preventive Care 65 Years and Older, Female Preventive care refers to lifestyle choices and visits with your health care provider that can promote health and wellness. What does preventive care include? A yearly physical exam. This is also called an annual well check. Dental exams once or twice a year. Routine eye exams. Ask your health care provider how often you should have your eyes checked. Personal lifestyle choices, including: Daily care of your teeth and gums. Regular physical activity. Eating a healthy diet. Avoiding tobacco and drug use. Limiting alcohol use. Practicing safe sex. Taking low-dose aspirin every day. Taking vitamin and mineral supplements as recommended by your health care provider. What happens during an annual well check? The services and screenings done by your health care provider during your annual well check  will depend on your age, overall health, lifestyle risk factors, and family history of disease. Counseling  Your health care provider may ask you questions about your: Alcohol use. Tobacco use. Drug use. Emotional well-being. Home and relationship well-being. Sexual activity. Eating habits. History of falls. Memory and ability to understand (cognition). Work and work Astronomer. Reproductive health. Screening  You may have the following tests or measurements: Height, weight, and BMI. Blood pressure. Lipid and cholesterol levels. These may be checked every 5 years, or more frequently if you are over 82 years old. Skin check. Lung cancer screening. You may have this screening every year starting at age 77 if you have a 30-pack-year history of smoking and currently smoke or have quit within the past 15 years. Fecal occult blood test (FOBT) of the stool. You may have this test every year starting at age 19. Flexible sigmoidoscopy or colonoscopy. You may have a sigmoidoscopy every 5 years or a colonoscopy every 10 years starting at age 40. Hepatitis C blood test. Hepatitis B blood test. Sexually transmitted disease (STD) testing. Diabetes screening. This is done by checking your blood sugar (glucose) after you have not eaten for a while (fasting). You may have this done every 1-3 years. Bone density scan. This is done to screen for osteoporosis. You may have this done starting at age 2. Mammogram. This may be done every 1-2 years. Talk to your health care provider about how often you should have regular mammograms. Talk with your  health care provider about your test results, treatment options, and if necessary, the need for more tests. Vaccines  Your health care provider may recommend certain vaccines, such as: Influenza vaccine. This is recommended every year. Tetanus, diphtheria, and acellular pertussis (Tdap, Td) vaccine. You may need a Td booster every 10 years. Zoster vaccine. You  may need this after age 30. Pneumococcal 13-valent conjugate (PCV13) vaccine. One dose is recommended after age 42. Pneumococcal polysaccharide (PPSV23) vaccine. One dose is recommended after age 12. Talk to your health care provider about which screenings and vaccines you need and how often you need them. This information is not intended to replace advice given to you by your health care provider. Make sure you discuss any questions you have with your health care provider. Document Released: 02/07/2015 Document Revised: 10/01/2015 Document Reviewed: 11/12/2014 Elsevier Interactive Patient Education  2017 [employer].  Fall Prevention in the Home Falls can cause injuries. They can happen to people of all ages. There are many things you can do to make your home safe and to help prevent falls. What can I do on the outside of my home? Regularly fix the edges of walkways and driveways and fix any cracks. Remove anything that might make you trip as you walk through a door, such as a raised step or threshold. Trim any bushes or trees on the path to your home. Use bright outdoor lighting. Clear any walking paths of anything that might make someone trip, such as rocks or tools. Regularly check to see if handrails are loose or broken. Make sure that both sides of any steps have handrails. Any raised decks and porches should have guardrails on the edges. Have any leaves, snow, or ice cleared regularly. Use sand or salt on walking paths during winter. Clean up any spills in your garage right away. This includes oil or grease spills. What can I do in the bathroom? Use night lights. Install grab bars by the toilet and in the tub and shower. Do not use towel bars as grab bars. Use non-skid mats or decals in the tub or shower. If you need to sit down in the shower, use a plastic, non-slip stool. Keep the floor dry. Clean up any water that spills on the floor as soon as it happens. Remove soap buildup  in the tub or shower regularly. Attach bath mats securely with double-sided non-slip rug tape. Do not have throw rugs and other things on the floor that can make you trip. What can I do in the bedroom? Use night lights. Make sure that you have a light by your bed that is easy to reach. Do not use any sheets or blankets that are too big for your bed. They should not hang down onto the floor. Have a firm chair that has side arms. You can use this for support while you get dressed. Do not have throw rugs and other things on the floor that can make you trip. What can I do in the kitchen? Clean up any spills right away. Avoid walking on wet floors. Keep items that you use a lot in easy-to-reach places. If you need to reach something above you, use a strong step stool that has a grab bar. Keep electrical cords out of the way. Do not use floor polish or wax that makes floors slippery. If you must use wax, use non-skid floor wax. Do not have throw rugs and other things on the floor that can make you trip. What  can I do with my stairs? Do not leave any items on the stairs. Make sure that there are handrails on both sides of the stairs and use them. Fix handrails that are broken or loose. Make sure that handrails are as long as the stairways. Check any carpeting to make sure that it is firmly attached to the stairs. Fix any carpet that is loose or worn. Avoid having throw rugs at the top or bottom of the stairs. If you do have throw rugs, attach them to the floor with carpet tape. Make sure that you have a light switch at the top of the stairs and the bottom of the stairs. If you do not have them, ask someone to add them for you. What else can I do to help prevent falls? Wear shoes that: Do not have high heels. Have rubber bottoms. Are comfortable and fit you well. Are closed at the toe. Do not wear sandals. If you use a stepladder: Make sure that it is fully opened. Do not climb a closed  stepladder. Make sure that both sides of the stepladder are locked into place. Ask someone to hold it for you, if possible. Clearly mark and make sure that you can see: Any grab bars or handrails. First and last steps. Where the edge of each step is. Use tools that help you move around (mobility aids) if they are needed. These include: Canes. Walkers. Scooters. Crutches. Turn on the lights when you go into a dark area. Replace any light bulbs as soon as they burn out. Set up your furniture so you have a clear path. Avoid moving your furniture around. If any of your floors are uneven, fix them. If there are any pets around you, be aware of where they are. Review your medicines with your doctor. Some medicines can make you feel dizzy. This can increase your chance of falling. Ask your doctor what other things that you can do to help prevent falls. This information is not intended to replace advice given to you by your health care provider. Make sure you discuss any questions you have with your health care provider. Document Released: 11/07/2008 Document Revised: 06/19/2015 Document Reviewed: 02/15/2014 Elsevier Interactive Patient Education  2017 [employer].

## 2022-07-01 NOTE — Progress Notes (Signed)
Subjective:   Latoya Wright is a 77 y.o. female who presents for Medicare Annual (Subsequent) preventive examination.  Review of Systems     Cardiac Risk Factors include: advanced age (>69men, >60 women)     Objective:    Today's Vitals   07/01/22 0942  BP: 122/74  Pulse: 65  Temp: 98 F (36.7 C)  SpO2: 98%  Weight: 108 lb 9.6 oz (49.3 kg)   Body mass index is 16.51 kg/m.     07/01/2022    9:46 AM 04/17/2021   12:31 AM 12/24/2020    4:25 PM 12/03/2020   11:55 AM 10/17/2020    2:38 AM 10/16/2020    2:39 PM 09/24/2018   12:20 AM  Advanced Directives  Does Patient Have a Medical Advance Directive? Yes No No Yes Yes No Yes  Type of Estate agent of [place];Living will    Living will;Healthcare Power of [employer] Power of [place];Living will  Does patient want to make changes to medical advance directive?   No - Patient declined No - Patient declined No - Patient declined  No - Patient declined  Copy of Healthcare Power of Attorney in Chart? No - copy requested    No - copy requested  No - copy requested  Would patient like information on creating a medical advance directive?  Yes (ED - Information included in AVS) No - Patient declined  No - Patient declined No - Patient declined     Current Medications (verified) Outpatient Encounter Medications as of 07/01/2022  Medication Sig   aspirin EC 81 MG EC tablet Take 1 tablet (81 mg total) by mouth daily. Swallow whole.   atorvastatin (LIPITOR) 40 MG tablet Take 1 tablet (40 mg total) by mouth at bedtime.   Biotin 5 MG TABS Take 5 mg by mouth daily.   VITAMIN D PO Take by mouth daily.   calcium-vitamin D (OSCAL 500/200 D-3) 500-200 MG-UNIT tablet Take 1 tablet by mouth 2 (two) times daily.   [DISCONTINUED] amoxicillin (AMOXIL) 875 MG tablet Take 1 tablet (875 mg total) by mouth 2 (two) times daily.   No facility-administered encounter medications on file as of 07/01/2022.    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   Procedure: TOTAL HIP ARTHROPLASTY ANTERIOR APPROACH;  Surgeon: Kathryne Hitch, MD;  Location: [facility];  Service: Orthopedics;  Laterality: Left;   Family History  Problem Relation Age of Onset   Heart disease Father    Heart disease Maternal Grandmother    Heart disease Maternal Grandfather    Heart disease Paternal Grandmother    Heart disease Paternal Grandfather    Rheum arthritis Sister    Rheum arthritis Brother    Social History   Socioeconomic History   Marital status: Married    Spouse name: Not on file   Number of children: 2   Years of education: Not on file   Highest education level: Not on file  Occupational History   Occupation: Engineer, site - Retired  Tobacco Use   Smoking status: Never   Smokeless tobacco: Never  Substance and Sexual Activity   Alcohol use: No   Drug use: No   Sexual activity: Not on file  Other Topics Concern   Not on file  Social History Narrative   Not on file   Social Determinants of Health   Financial Resource Strain: Low Risk  (07/01/2022)   Overall Financial Resource Strain (CARDIA)    Difficulty of Paying Living Expenses: Not hard at all  Food Insecurity: No Food Insecurity (07/01/2022)   Hunger Vital Sign    Worried About Running Out of Food in the Last Year: Never true    Ran Out of Food in the Last Year: Never true  Transportation Needs: No Transportation Needs (07/01/2022)   PRAPARE - Administrator, Civil Service (Medical): No    Lack of Transportation (Non-Medical): No  Physical Activity: Sufficiently Active (07/01/2022)   Exercise Vital Sign    Days of Exercise per Week: 5 days    Minutes of Exercise per Session: 40 min  Stress: No Stress Concern Present (07/01/2022)    [employer] of Occupational Health - Occupational Stress Questionnaire    Feeling of Stress : Not at all  Social Connections: Moderately Integrated (07/01/2022)   Social Connection and Isolation Panel [NHANES]    Frequency of Communication with Friends and Family: More than three times a week    Frequency of Social Gatherings with Friends and Family: More than three times a week    Attends Religious Services: More than 4 times per year    Active Member of [employer] or Organizations: No    Attends Engineer, structural: Never    Marital Status: Married    Tobacco Counseling Counseling given: Not Answered   Clinical Intake:  Pre-visit preparation completed: Yes  Pain : No/denies pain     BMI - recorded: 16.54 Nutritional Status: BMI <19  Underweight Nutritional Risks: None Diabetes: No  How often do you need to have someone help you when you read instructions, pamphlets, or other written materials from your doctor or pharmacy?: 1 - Never  Diabetic?no  Interpreter Needed?: No  Information entered by :: Lanier Ensign, LPN   Activities of Daily Living    07/01/2022    9:48 AM  In your present state of health, do you have any difficulty performing the following activities:  Hearing? 0  Vision? 0  Difficulty concentrating or making decisions? 0  Walking or climbing stairs? 0  Dressing or bathing? 0  Doing errands, shopping? 0  Preparing Food and eating ? N  Using the Toilet? N  In the past six months, have you accidently leaked urine? N  Do you have problems with loss of bowel control? N  Managing your Medications? N  Managing your Finances? N  Housekeeping or managing your Housekeeping? N    Patient Care Team: Ardith Dark, MD as PCP - General (Family Medicine)  Indicate any recent Medical Services you may have received from other than [facility] providers in the past year (date may be approximate).     Assessment:   This is a routine wellness  examination for Latoya Wright.  Hearing/Vision screen Hearing Screening - Comments:: Pt wants to get hearing checked  Vision Screening - Comments:: Pt follows up with Dr Randon Goldsmith for annual eye exams   Dietary issues and exercise activities discussed: Current Exercise Habits: Home exercise routine, Type of exercise: walking;stretching;Other - see comments (work with a Psychologist, educational), Time (Minutes): 40, Frequency (Times/Week): 7, Weekly Exercise (Minutes/Week): 280   Goals Addressed             This Visit's Progress    Patient Stated       Stay healthy  Depression Screen    07/01/2022    9:45 AM 03/22/2022   11:31 AM 05/19/2021   11:00 AM 12/29/2020    1:02 PM 12/10/2020    4:47 PM 10/01/2020    8:04 AM 08/21/2020   10:30 AM  PHQ 2/9 Scores  PHQ - 2 Score 0 0 0 2 0 0 0    Fall Risk    07/01/2022    9:48 AM 03/22/2022   11:31 AM 05/19/2021   11:00 AM 12/29/2020    1:01 PM 08/21/2020   10:31 AM  Fall Risk   Falls in the past year? 0 0 0 1 0  Number falls in past yr: 0 0 0 1 0  Injury with Fall? 0 0 0 1 0  Risk for fall due to : Impaired vision;Impaired balance/gait No Fall Risks No Fall Risks  No Fall Risks  Follow up Falls prevention discussed        FALL RISK PREVENTION PERTAINING TO THE HOME:  Any stairs in or around the home? Yes  If so, are there any without handrails? No  Home free of loose throw rugs in walkways, pet beds, electrical cords, etc? Yes  Adequate lighting in your home to reduce risk of falls? Yes   ASSISTIVE DEVICES UTILIZED TO PREVENT FALLS:  Life alert? No  Use of a cane, walker or w/c? No  Grab bars in the bathroom? No  Shower chair or bench in shower? Yes  Elevated toilet seat or a handicapped toilet? No   TIMED UP AND GO:  Was the test performed? Yes .  Length of time to ambulate 10 feet: 10 sec.   Gait steady and fast without use of assistive device  Cognitive Function:        07/01/2022    9:50 AM  6CIT Screen  What Year? 0 points  What  month? 0 points  What time? 0 points  Count back from 20 0 points  Months in reverse 0 points  Repeat phrase 0 points  Total Score 0 points    Immunizations Immunization History  Administered Date(s) Administered   PFIZER(Purple Top)SARS-COV-2 Vaccination 03/11/2019, 04/03/2019, 11/23/2019, 08/11/2020   Pneumococcal Conjugate-13 12/12/2013   Pneumococcal Polysaccharide-23 11/10/2011   Td 08/21/2020   Tdap 01/26/2004   Zoster Recombinat (Shingrix) 08/21/2020   Zoster, Live 01/25/2009    TDAP status: Up to date  Flu Vaccine status: Declined, Education has been provided regarding the importance of this vaccine but patient still declined. Advised may receive this vaccine at local pharmacy or Health Dept. Aware to provide a copy of the vaccination record if obtained from local pharmacy or Health Dept. Verbalized acceptance and understanding.  Pneumococcal vaccine status: Up to date  Covid-19 vaccine status: Completed vaccines  Qualifies for Shingles Vaccine? Yes   Zostavax completed Yes   Shingrix Completed?: No.    Education has been provided regarding the importance of this vaccine. Patient has been advised to call insurance company to determine out of pocket expense if they have not yet received this vaccine. Advised may also receive vaccine at local pharmacy or Health Dept. Verbalized acceptance and understanding.  Screening Tests Health Maintenance  Topic Date Due   Hepatitis C Screening  Never done   Zoster Vaccines- Shingrix (2 of 2) 10/16/2020   DEXA SCAN  08/12/2022   INFLUENZA VACCINE  08/26/2022   MAMMOGRAM  06/28/2023   Medicare Annual Wellness (AWV)  07/01/2023   DTaP/Tdap/Td (3 - Td or Tdap) 08/22/2030  Pneumonia Vaccine 68+ Years old  Completed   HPV VACCINES  Aged Out   Colonoscopy  Discontinued   COVID-19 Vaccine  Discontinued    Health Maintenance  Health Maintenance Due  Topic Date Due   Hepatitis C Screening  Never done   Zoster Vaccines- Shingrix  (2 of 2) 10/16/2020    Colorectal cancer screening: No longer required.   Mammogram status: Completed 06/28/22. Repeat every year  Bone Density status: Completed 08/11/20. Results reflect: Bone density results: OSTEOPOROSIS. Repeat every 2 years.   Additional Screening:  Hepatitis C Screening: does qualify  Vision Screening: Recommended annual ophthalmology exams for early detection of glaucoma and other disorders of the eye. Is the patient up to date with their annual eye exam?  Yes  Who is the provider or what is the name of the office in which the patient attends annual eye exams? Dr Randon Goldsmith  If pt is not established with a provider, would they like to be referred to a provider to establish care? No .   Dental Screening: Recommended annual dental exams for proper oral hygiene  Community Resource Referral / Chronic Care Management: CRR required this visit?  No   CCM required this visit?  No      Plan:     I have personally reviewed and noted the following in the patient's chart:   Medical and social history Use of alcohol, tobacco or illicit drugs  Current medications and supplements including opioid prescriptions. Patient is not currently taking opioid prescriptions. Functional ability and status Nutritional status Physical activity Advanced directives List of other physicians Hospitalizations, surgeries, and ER visits in previous 12 months Vitals Screenings to include cognitive, depression, and falls Referrals and appointments  In addition, I have reviewed and discussed with patient certain preventive protocols, quality metrics, and best practice recommendations. A written personalized care plan for preventive services as well as general preventive health recommendations were provided to patient.     Marzella Schlein, LPN   01/30/1094   Nurse Notes: none

## 2022-07-19 ENCOUNTER — Ambulatory Visit (INDEPENDENT_AMBULATORY_CARE_PROVIDER_SITE_OTHER): Payer: Medicare PPO | Admitting: Family Medicine

## 2022-07-19 ENCOUNTER — Encounter: Payer: Self-pay | Admitting: Family Medicine

## 2022-07-19 VITALS — BP 138/70 | HR 67 | Temp 97.4°F | Ht 68.0 in | Wt 108.0 lb

## 2022-07-19 DIAGNOSIS — I639 Cerebral infarction, unspecified: Secondary | ICD-10-CM

## 2022-07-19 DIAGNOSIS — M810 Age-related osteoporosis without current pathological fracture: Secondary | ICD-10-CM

## 2022-07-19 DIAGNOSIS — D179 Benign lipomatous neoplasm, unspecified: Secondary | ICD-10-CM

## 2022-07-19 DIAGNOSIS — Z0001 Encounter for general adult medical examination with abnormal findings: Secondary | ICD-10-CM

## 2022-07-19 DIAGNOSIS — H9193 Unspecified hearing loss, bilateral: Secondary | ICD-10-CM | POA: Diagnosis not present

## 2022-07-19 LAB — COMPREHENSIVE METABOLIC PANEL
ALT: 179 U/L — ABNORMAL HIGH (ref 0–35)
AST: 149 U/L — ABNORMAL HIGH (ref 0–37)
Albumin: 4 g/dL (ref 3.5–5.2)
Alkaline Phosphatase: 66 U/L (ref 39–117)
BUN: 18 mg/dL (ref 6–23)
CO2: 30 mEq/L (ref 19–32)
Calcium: 9.4 mg/dL (ref 8.4–10.5)
Chloride: 108 mEq/L (ref 96–112)
Creatinine, Ser: 0.79 mg/dL (ref 0.40–1.20)
GFR: 72.36 mL/min (ref >60.00)
Glucose, Bld: 81 mg/dL (ref 70–99)
Potassium: 4.3 mEq/L (ref 3.5–5.1)
Sodium: 143 mEq/L (ref 135–145)
Total Bilirubin: 1.1 mg/dL (ref 0.2–1.2)
Total Protein: 7.1 g/dL (ref 6.0–8.3)

## 2022-07-19 LAB — LIPID PANEL
Cholesterol: 148 mg/dL (ref 0–200)
HDL: 71.1 mg/dL (ref >39.00)
LDL Cholesterol: 67 mg/dL (ref 0–99)
NonHDL: 77.28
Total CHOL/HDL Ratio: 2
Triglycerides: 49 mg/dL (ref 0.0–149.0)
VLDL: 9.8 mg/dL (ref 0.0–40.0)

## 2022-07-19 LAB — CBC
HCT: 40.8 % (ref 36.0–46.0)
Hemoglobin: 13.2 g/dL (ref 12.0–15.0)
MCHC: 32.3 g/dL (ref 30.0–36.0)
MCV: 90.9 fl (ref 78.0–100.0)
Platelets: 174 10*3/uL (ref 150.0–400.0)
RBC: 4.49 Mil/uL (ref 3.87–5.11)
RDW: 13.7 % (ref 11.5–15.5)
WBC: 4.3 10*3/uL (ref 4.0–10.5)

## 2022-07-19 LAB — TSH: TSH: 0.81 u[IU]/mL (ref 0.35–5.50)

## 2022-07-19 LAB — HEMOGLOBIN A1C: Hgb A1c MFr Bld: 5.3 % (ref 4.6–6.5)

## 2022-07-19 NOTE — Assessment & Plan Note (Signed)
Following with plastic surgery for this.  She may have some compressive symptoms but currently insurance is only allowing 2 excisions at a time.

## 2022-07-19 NOTE — Progress Notes (Signed)
Chief Complaint:  Latoya Wright is a 77 y.o. female who presents today for her annual comprehensive physical exam.    Assessment/Plan:  New/Acute Problems: Decreased Hearing  Will refer to audiology.  Normal exam today.  Elevated blood pressure reading Initially elevated however at goal on recheck.  She will continue to monitor at home and let us know if persistently elevated.  Chronic Problems Addressed Today: Lipoma Following with plastic surgery for this.  She may have some compressive symptoms but currently insurance is only allowing 2 excisions at a time.  Osteoporosis Will will update DEXA scan.  Last was 2 years ago.  She is on calcium and vitamin D supplementation.  Acute CVA (cerebrovascular accident) (HCC) - with RUE > RLE weakness and expressive aphasia On statin and aspirin.  Check labs today.  Preventative Healthcare: Declined colon cancer screening.  She will check with pharmacy about shingles vaccine.  Will update DEXA scan.  Patient Counseling(The following topics were reviewed and/or handout was given):  -Nutrition: Stressed importance of moderation in sodium/caffeine intake, saturated fat and cholesterol, caloric balance, sufficient intake of fresh fruits, vegetables, and fiber.  -Stressed the importance of regular exercise.   -Substance Abuse: Discussed cessation/primary prevention of tobacco, alcohol, or other drug use; driving or other dangerous activities under the influence; availability of treatment for abuse.   -Injury prevention: Discussed safety belts, safety helmets, smoke detector, smoking near bedding or upholstery.   -Sexuality: Discussed sexually transmitted diseases, partner selection, use of condoms, avoidance of unintended pregnancy and contraceptive alternatives.   -Dental health: Discussed importance of regular tooth brushing, flossing, and dental visits.  -Health maintenance and immunizations reviewed. Please refer to Health maintenance  section.  Return to care in 1 year for next preventative visit.     Subjective:  HPI:  She has no acute complaints today.   Lifestyle Diet: Balanced. Plenty of fruits and vegetables.  Exercise: Gym 5 times per week. Working with Systems analyst.      07/19/2022    9:59 AM  Depression screen PHQ 2/9  Decreased Interest 0  Down, Depressed, Hopeless 0  PHQ - 2 Score 0    There are no preventive care reminders to display for this patient.   ROS: Per HPI, otherwise a complete review of systems was negative.   PMH:  The following were reviewed and entered/updated in epic: Past Medical History:  Diagnosis Date   Allergy    Arthritis    Stroke [facility])    Patient Active Problem List   Diagnosis Date Noted   Paroxysmal SVT (supraventricular tachycardia) 05/19/2021   Adjustment disorder with depressed mood 10/28/2020   Acute CVA (cerebrovascular accident) [facility]) - with RUE > RLE weakness and expressive aphasia 10/16/2020   Underweight 10/14/2020   Stress 10/14/2020   Allergic rhinitis 10/01/2020   Lipoma 08/21/2020   Osteoporosis 08/21/2020   Pulmonary nodules 04/10/2014   Thyroid nodule 12/12/2013   Hemangioma of liver 12/12/2013   Past Surgical History:  Procedure Laterality Date   TOTAL HIP ARTHROPLASTY 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   Procedure: TOTAL HIP ARTHROPLASTY ANTERIOR APPROACH;  Surgeon: Kathryne Hitch, MD;  Location: [facility];  Service: Orthopedics;  Laterality: Left;    Family History  Problem Relation Age of Onset   Heart disease Father    Heart disease Maternal Grandmother    Heart disease Maternal Grandfather    Heart disease Paternal Grandmother    Heart disease Paternal Grandfather    Rheum arthritis Sister  Rheum arthritis Brother     Medications- reviewed and updated Current Outpatient Medications  Medication Sig Dispense Refill   aspirin EC 81 MG EC tablet Take 1 tablet (81 mg total) by mouth daily. Swallow whole. 30 tablet 11   atorvastatin  (LIPITOR) 40 MG tablet Take 1 tablet (40 mg total) by mouth at bedtime. 90 tablet 3   Biotin 5 MG TABS Take 5 mg by mouth daily.     calcium-vitamin D (OSCAL 500/200 D-3) 500-200 MG-UNIT tablet Take 1 tablet by mouth 2 (two) times daily. 180 tablet 3   Magnesium 500 MG CAPS Take by mouth.     triamcinolone ointment (KENALOG) 0.1 %      VITAMIN D PO Take by mouth daily.     Zinc 50 MG TABS Take by mouth.     No current facility-administered medications for this visit.    Allergies-reviewed and updated Allergies  Allergen Reactions   Dayquil [Pseudoephedrine-Apap-Dm]     jittery    Social History   Socioeconomic History   Marital status: Married    Spouse name: Not on file   Number of children: 2   Years of education: Not on file   Highest education level: Not on file  Occupational History   Occupation: Engineer, site - Retired  Tobacco Use   Smoking status: Never   Smokeless tobacco: Never  Substance and Sexual Activity   Alcohol use: No   Drug use: No   Sexual activity: Not on file  Other Topics Concern   Not on file  Social History Narrative   Not on file   Social Determinants of Health   Financial Resource Strain: Low Risk  (07/01/2022)   Overall Financial Resource Strain (CARDIA)    Difficulty of Paying Living Expenses: Not hard at all  Food Insecurity: No Food Insecurity (07/01/2022)   Hunger Vital Sign    Worried About Running Out of Food in the Last Year: Never true    Ran Out of Food in the Last Year: Never true  Transportation Needs: No Transportation Needs (07/01/2022)   PRAPARE - Administrator, Civil Service (Medical): No    Lack of Transportation (Non-Medical): No  Physical Activity: Sufficiently Active (07/01/2022)   Exercise Vital Sign    Days of Exercise per Week: 5 days    Minutes of Exercise per Session: 40 min  Stress: No Stress Concern Present (07/01/2022)   [employer] of Occupational Health - Occupational Stress Questionnaire     Feeling of Stress : Not at all  Social Connections: Moderately Integrated (07/01/2022)   Social Connection and Isolation Panel [NHANES]    Frequency of Communication with Friends and Family: More than three times a week    Frequency of Social Gatherings with Friends and Family: More than three times a week    Attends Religious Services: More than 4 times per year    Active Member of [employer] or Organizations: No    Attends Banker Meetings: Never    Marital Status: Married        Objective:  Physical Exam: BP 138/70   Pulse 67   Temp (!) 97.4 F (36.3 C) (Temporal)   Ht 5\' 8"  (1.727 m)   Wt 108 lb (49 kg)   SpO2 100%   BMI 16.42 kg/m   Body mass index is 16.42 kg/m. Wt Readings from Last 3 Encounters:  07/19/22 108 lb (49 kg)  07/01/22 108 lb 9.6 oz (49.3 kg)  03/22/22 109 lb 12.8 oz (49.8 kg)   Gen: NAD, resting comfortably HEENT: TMs normal bilaterally. OP clear. No thyromegaly noted.  CV: RRR with no murmurs appreciated Pulm: NWOB, CTAB with no crackles, wheezes, or rhonchi GI: Normal bowel sounds present. Soft, Nontender, Nondistended. MSK: no edema, cyanosis, or clubbing noted Skin: warm, dry Neuro: CN2-12 grossly intact. Strength 5/5 in upper and lower extremities. Reflexes symmetric and intact bilaterally.  Psych: Normal affect and thought content     Latoya Drach M. [name], MD 07/19/2022 10:29 AM

## 2022-07-19 NOTE — Assessment & Plan Note (Signed)
Will will update DEXA scan.  Last was 2 years ago.  She is on calcium and vitamin D supplementation.

## 2022-07-19 NOTE — Assessment & Plan Note (Signed)
On statin and aspirin.  Check labs today.

## 2022-07-19 NOTE — Patient Instructions (Addendum)
It was very nice to see you today!  Will check blood work today.  We will order your bone density scan.  I will refer you to see the audiologist.  Please keep an eye on your blood pressure and let us know if it is persistently elevated.  Return in about 1 year (around 07/19/2023) for Annual Physical.   Take care, Dr [name]  PLEASE NOTE:  If you had any lab tests, please let us know if you have not heard back within a few days. You may see your results on mychart before we have a chance to review them but we will give you a call once they are reviewed by [place].   If we ordered any referrals today, please let us know if you have not heard from their office within the next week.   If you had any urgent prescriptions sent in today, please check with the pharmacy within an hour of our visit to make sure the prescription was transmitted appropriately.   Please try these tips to maintain a healthy lifestyle:  Eat at least 3 REAL meals and 1-2 snacks per day.  Aim for no more than 5 hours between eating.  If you eat breakfast, please do so within one hour of getting up.   Each meal should contain half fruits/vegetables, one quarter protein, and one quarter carbs (no bigger than a computer mouse)  Cut down on sweet beverages. This includes juice, soda, and sweet tea.   Drink at least 1 glass of water with each meal and aim for at least 8 glasses per day  Exercise at least 150 minutes every week.    Preventive Care 22 Years and Older, Female Preventive care refers to lifestyle choices and visits with your health care provider that can promote health and wellness. Preventive care visits are also called wellness exams. What can I expect for my preventive care visit? Counseling Your health care provider may ask you questions about your: Medical history, including: Past medical problems. Family medical history. Pregnancy and menstrual history. History of falls. Current health,  including: Memory and ability to understand (cognition). Emotional well-being. Home life and relationship well-being. Sexual activity and sexual health. Lifestyle, including: Alcohol, nicotine or tobacco, and drug use. Access to firearms. Diet, exercise, and sleep habits. Work and work Astronomer. Sunscreen use. Safety issues such as seatbelt and bike helmet use. Physical exam Your health care provider will check your: Height and weight. These may be used to calculate your BMI (body mass index). BMI is a measurement that tells if you are at a healthy weight. Waist circumference. This measures the distance around your waistline. This measurement also tells if you are at a healthy weight and may help predict your risk of certain diseases, such as type 2 diabetes and high blood pressure. Heart rate and blood pressure. Body temperature. Skin for abnormal spots. What immunizations do I need?  Vaccines are usually given at various ages, according to a schedule. Your health care provider will recommend vaccines for you based on your age, medical history, and lifestyle or other factors, such as travel or where you work. What tests do I need? Screening Your health care provider may recommend screening tests for certain conditions. This may include: Lipid and cholesterol levels. Hepatitis C test. Hepatitis B test. HIV (human immunodeficiency virus) test. STI (sexually transmitted infection) testing, if you are at risk. Lung cancer screening. Colorectal cancer screening. Diabetes screening. This is done by checking your blood sugar (glucose)  after you have not eaten for a while (fasting). Mammogram. Talk with your health care provider about how often you should have regular mammograms. BRCA-related cancer screening. This may be done if you have a family history of breast, ovarian, tubal, or peritoneal cancers. Bone density scan. This is done to screen for osteoporosis. Talk with your health  care provider about your test results, treatment options, and if necessary, the need for more tests. Follow these instructions at home: Eating and drinking  Eat a diet that includes fresh fruits and vegetables, whole grains, lean protein, and low-fat dairy products. Limit your intake of foods with high amounts of sugar, saturated fats, and salt. Take vitamin and mineral supplements as recommended by your health care provider. Do not drink alcohol if your health care provider tells you not to drink. If you drink alcohol: Limit how much you have to 0-1 drink a day. Know how much alcohol is in your drink. In the [place] one drink equals one 12 oz bottle of beer (355 mL), one 5 oz glass of wine (148 mL), or one 1 oz glass of hard liquor (44 mL). Lifestyle Brush your teeth every morning and night with fluoride toothpaste. Floss one time each day. Exercise for at least 30 minutes 5 or more days each week. Do not use any products that contain nicotine or tobacco. These products include cigarettes, chewing tobacco, and vaping devices, such as e-cigarettes. If you need help quitting, ask your health care provider. Do not use drugs. If you are sexually active, practice safe sex. Use a condom or other form of protection in order to prevent STIs. Take aspirin only as told by your health care provider. Make sure that you understand how much to take and what form to take. Work with your health care provider to find out whether it is safe and beneficial for you to take aspirin daily. Ask your health care provider if you need to take a cholesterol-lowering medicine (statin). Find healthy ways to manage stress, such as: Meditation, yoga, or listening to music. Journaling. Talking to a trusted person. Spending time with friends and family. Minimize exposure to UV radiation to reduce your risk of skin cancer. Safety Always wear your seat belt while driving or riding in a vehicle. Do not drive: If you have  been drinking alcohol. Do not ride with someone who has been drinking. When you are tired or distracted. While texting. If you have been using any mind-altering substances or drugs. Wear a helmet and other protective equipment during sports activities. If you have firearms in your house, make sure you follow all gun safety procedures. What's next? Visit your health care provider once a year for an annual wellness visit. Ask your health care provider how often you should have your eyes and teeth checked. Stay up to date on all vaccines. This information is not intended to replace advice given to you by your health care provider. Make sure you discuss any questions you have with your health care provider. Document Revised: 07/09/2020 Document Reviewed: 07/09/2020 Elsevier Patient Education  2024 [employer]. Check labs.

## 2022-07-20 NOTE — Progress Notes (Signed)
Her liver numbers are up quite a bit.  Recommend she come back this week to recheck.  Please place future order for CMET.  The rest of her labs are all normal.  She should keep up the great work with diet and exercise and we can recheck everything else in a year or so.

## 2022-07-21 ENCOUNTER — Other Ambulatory Visit: Payer: Self-pay | Admitting: *Deleted

## 2022-07-21 ENCOUNTER — Telehealth: Payer: Self-pay | Admitting: Family Medicine

## 2022-07-21 DIAGNOSIS — R748 Abnormal levels of other serum enzymes: Secondary | ICD-10-CM

## 2022-07-21 NOTE — Telephone Encounter (Signed)
Spoke to pt told her per Dr. [name], Her liver numbers are up quite a bit.  Recommend she come back this week to recheck.    The rest of her labs are all normal.  She should keep up the great work with diet and exercise and we can recheck everything else in a year or so. Pt verbalized understanding. Lab appt scheduled for 6/28.

## 2022-07-21 NOTE — Telephone Encounter (Signed)
Patient called in regards to lab results. She is requesting a call back from clinical team.

## 2022-07-23 ENCOUNTER — Other Ambulatory Visit: Payer: Medicare PPO

## 2022-07-23 DIAGNOSIS — R748 Abnormal levels of other serum enzymes: Secondary | ICD-10-CM

## 2022-07-23 NOTE — Addendum Note (Signed)
Addended by: Nash Shearer D on: 07/23/2022 03:13 PM   Modules accepted: Orders

## 2022-07-24 LAB — COMPREHENSIVE METABOLIC PANEL
AG Ratio: 1.4 (calc) (ref 1.0–2.5)
ALT: 157 U/L — ABNORMAL HIGH (ref 6–29)
AST: 130 U/L — ABNORMAL HIGH (ref 10–35)
Albumin: 3.8 g/dL (ref 3.6–5.1)
Alkaline phosphatase (APISO): 67 U/L (ref 37–153)
BUN: 23 mg/dL (ref 7–25)
CO2: 27 mmol/L (ref 20–32)
Calcium: 9.2 mg/dL (ref 8.6–10.4)
Chloride: 107 mmol/L (ref 98–110)
Creat: 0.89 mg/dL (ref 0.60–1.00)
Globulin: 2.8 g/dL (calc) (ref 1.9–3.7)
Glucose, Bld: 91 mg/dL (ref 65–99)
Potassium: 4.2 mmol/L (ref 3.5–5.3)
Sodium: 142 mmol/L (ref 135–146)
Total Bilirubin: 0.7 mg/dL (ref 0.2–1.2)
Total Protein: 6.6 g/dL (ref 6.1–8.1)

## 2022-07-26 ENCOUNTER — Telehealth: Payer: Self-pay | Admitting: Family Medicine

## 2022-07-26 NOTE — Progress Notes (Signed)
Her liver numbers are slightly better but still elevated. Recommend she avoid all Tylenol and alcohol if she is not doing so already.  Recommend we check right upper quadrant ultrasound to look for possible causes.  Please place order for RUQ ultrasound and have her schedule an appointment here soon to discuss labs.   Katina Degree. [name], MD 07/26/2022 3:10 PM

## 2022-07-26 NOTE — Telephone Encounter (Signed)
Pt called regarding repeat lab results & would like to know what to do next. Please call

## 2022-07-27 NOTE — Telephone Encounter (Signed)
See results note. 

## 2022-07-29 ENCOUNTER — Other Ambulatory Visit (HOSPITAL_COMMUNITY): Payer: Self-pay

## 2022-07-29 NOTE — Telephone Encounter (Signed)
Pt ready for scheduling for Prolia on or after : 08/31/22  Out-of-pocket cost due at time of visit: $80  Primary: [employer] - Medicare Prolia co-insurance: $40 Admin fee co-insurance: $40  Secondary: N/A Prolia co-insurance:  Admin fee co-insurance:   Medical Benefit Details: Date Benefits were checked: 07/29/22 Deductible: no/ Coinsurance: $40/ Admin Fee: $40  Prior Auth: Approved PA# [id number] Expiration Date: 02/12/21 to 01/25/23   Pharmacy benefit: Copay $64 If patient wants fill through the pharmacy benefit please send prescription to:  [facility] , and include estimated need by date in rx notes. Pharmacy will ship medication directly to the office.  Patient not eligible for Prolia Copay Card. Copay Card can make patient's cost as little as $25. Link to apply: [email or web address]://[email or web address]  ** This summary of benefits is an estimation of the patient's out-of-pocket cost. Exact cost may very based on individual plan coverage.

## 2022-07-29 NOTE — Telephone Encounter (Signed)
Prolia VOB initiated via [email or web address]  Last Prolia inj: 03/02/22 Next Prolia inj DUE:  08/31/22

## 2022-07-30 NOTE — Telephone Encounter (Signed)
Need OV with nurse

## 2022-07-30 NOTE — Telephone Encounter (Signed)
Patient has been scheduled for prolia on 09/01/22 @10  am. She has been informed about $80 co-pay and verbalized understanding.

## 2022-08-05 ENCOUNTER — Ambulatory Visit: Payer: Medicare PPO | Attending: Family Medicine | Admitting: Audiologist

## 2022-08-05 DIAGNOSIS — H938X3 Other specified disorders of ear, bilateral: Secondary | ICD-10-CM | POA: Insufficient documentation

## 2022-08-05 DIAGNOSIS — H903 Sensorineural hearing loss, bilateral: Secondary | ICD-10-CM | POA: Insufficient documentation

## 2022-08-05 NOTE — Procedures (Addendum)
  Outpatient Audiology and [facility] [address] [place], [place]  [address] [phone]  AUDIOLOGICAL  EVALUATION  NAME: Latoya Wright     DOB:   [birth year]      MRN: [id number]                                                                                     DATE: 08/05/2022     REFERENT: Ardith Dark, MD STATUS: Outpatient DIAGNOSIS: SNHL   History: [name] was seen for an audiological evaluation. [name] was accompanied to the appointment by her husband. [name] is receiving a hearing evaluation due to concerns for hearing loss and stuffiness in her ears. [name] had a stroke and wonders if it has impacted her hearing. She had the stoke in 2022 resulting in some word finding difficulty and right sided weakness. [name] has difficulty hearing. This difficulty hearing onset gradually.  No pain reported in either ear. Both ears feel stopped up. 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  Medical history positive for stroke which is a risk factor for hearing loss. No other relevant case history reported.   Evaluation:  Otoscopy showed a clear view of the tympanic membranes, bilaterally Tympanometry results were consistent with normal middle ear function, bilaterally   Audiometric testing was completed using conventional audiometry with supraural transducer. Speech Recognition Thresholds were 25 dB in the right ear and 25 dB in the left ear. Word Recognition was performed  65 dB SL, scored 84 % in the right ear and 88% in the left ear. 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 A conductive component was noted at 4 kHz in the left ear.   Results:  The test results were reviewed with [name]. 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 Otoscopy and tympanometry indicate  normal outer and middle ear anatomy and function. Hearing aids were recommended and information on local hearing aid providers was given to [name]. It is recommended that she be referred to an ENT due to aural fullness and noted conductive component on audiogram.   Recommendations: Amplification is necessary for both ears. Hearing aids can be purchased from a variety of locations. See provided list for locations in the [place] area.  Refer to ENT due to bilateral aural fullness and conductive component in left ear.    20 minutes spent testing and counseling on results.   [name]  Audiologist, Au.D., CCC-A 08/05/2022  9:06 AM  [name], MS Audiology Student    Cc: Ardith Dark, MD

## 2022-08-10 ENCOUNTER — Ambulatory Visit: Payer: Medicare PPO | Admitting: Plastic Surgery

## 2022-08-10 ENCOUNTER — Encounter: Payer: Self-pay | Admitting: Plastic Surgery

## 2022-08-10 VITALS — BP 169/73 | HR 75

## 2022-08-10 DIAGNOSIS — D1721 Benign lipomatous neoplasm of skin and subcutaneous tissue of right arm: Secondary | ICD-10-CM

## 2022-08-10 DIAGNOSIS — R2231 Localized swelling, mass and lump, right upper limb: Secondary | ICD-10-CM

## 2022-08-10 NOTE — Progress Notes (Signed)
Procedure Note  Preoperative Dx: Mass of right arm x 2  Postoperative Dx: Same  Procedure: Excision of mass right arm/lipoma Proximal - 2 cm Distal - 1.5 cm and 1.5 cm  Anesthesia: Lidocaine 1% with 1:100,000 epinephrine  Description of Procedure: Risks and complications were explained to the patient.  Consent was confirmed and the patient understands the risks and benefits.  The potential complications and alternatives were explained and the patient consents.  The patient expressed understanding the option of not having the procedure and the risks of a scar.  Time out was called and all information was confirmed to be correct.    Proximal: The area was prepped and drapped.  Lidocaine 1% with epinephrine was injected in the subcutaneous area.  After waiting several minutes for the local to take affect a #15 blade was used to incise the skin over the area.  The mass looked like a lipoma.  Patient did not want to send to path.  Two lesions were removed.  The skin edges were reapproximated with 6-0 Monocryl.    Distal: The area was prepped and drapped.  Lidocaine 1% with epinephrine was injected in the subcutaneous area.  After waiting several minutes for the local to take affect a #15 blade was used to incise the skin over the area.  The mass looked like a lipoma.  Patient did not want to send to path.  The lesion was removed.  The skin edges were reapproximated with 6-0 Monocryl.  A dressing was applied.  The patient was given instructions on how to care for the area and a follow up appointment.  Bevin tolerated the procedure well and there were no complications. The specimen was sent to pathology.

## 2022-08-16 ENCOUNTER — Ambulatory Visit
Admission: RE | Admit: 2022-08-16 | Discharge: 2022-08-16 | Disposition: A | Discharge status: Home / Self Care | Payer: Medicare PPO | Source: Ambulatory Visit | Attending: Family Medicine | Admitting: Family Medicine

## 2022-08-16 DIAGNOSIS — R748 Abnormal levels of other serum enzymes: Secondary | ICD-10-CM

## 2022-08-17 ENCOUNTER — Telehealth: Payer: Self-pay | Admitting: Cardiovascular Disease

## 2022-08-17 ENCOUNTER — Other Ambulatory Visit: Payer: Self-pay | Admitting: Family Medicine

## 2022-08-17 ENCOUNTER — Telehealth: Payer: Self-pay | Admitting: *Deleted

## 2022-08-17 DIAGNOSIS — K769 Liver disease, unspecified: Secondary | ICD-10-CM

## 2022-08-17 NOTE — Progress Notes (Signed)
Her ultrasound does show that she does have some fatty liver however there is a spot that they cannot fully evaluate with an ultrasound.  Radiology recommended further evaluation.  I will place an order for an MRI and they should call her to get this scheduled soon.  They also did see that she had gallstones.  If she is not having any symptoms that should not cause her any issues.  Do not need to do any further testing for this at this point however she should let us know if she is having any abdominal pain or nausea or vomiting.  They also saw the collection of blood vessels that was present in 2015.  This has not changed.

## 2022-08-17 NOTE — Progress Notes (Signed)
Cholesterol medications can rarely cause increase in liver numbers however we need to make sure there is nothing else going on first.  Lyndsy Gilberto M. [name], MD 08/17/2022 3:00 PM

## 2022-08-17 NOTE — Telephone Encounter (Signed)
Pt c/o medication issue:  1. Name of Medication:   atorvastatin (LIPITOR) 40 MG tablet    2. How are you currently taking this medication (dosage and times per day)?   Take 1 tablet (40 mg total) by mouth at bedtime.    3. Are you having a reaction (difficulty breathing--STAT)? No  4. What is your medication issue? Patient is very concerned that this medication may be causing her liver count to be high. Patient stated that she had a physical a while back ago (did say exactly when it was) and her ALT was 13 and her AST was 19. Patient stated she just had it rechecked and her for her ALT and AST was 170 and 150. Patient stated she couldn't remember if the which one was which, but she is very concerned about her liver count. Please advise.

## 2022-08-17 NOTE — Telephone Encounter (Signed)
Erskine Squibb from [facility] radiology called  With call Report  There is an ill-defined mixed hypoechoic and echogenic area in the superficial RIGHT liver which measures approximately 5.4 x 2.9 x 4.0 cm. This is indeterminate and could reflect an area of focal fatty sparing, however given underlying heterogeneous parenchymal echogenicity, underlying mass cannot be excluded. Recommend further evaluation with dedicated liver protocol MRI or CT.

## 2022-08-17 NOTE — Telephone Encounter (Signed)
Spoke to the patient, she taken Atorvastatin 40 mg . On 7/1 pt was advised by her PCP AST 130 and ALT 157. Pt stated she has d/c'd tylenol "long time ago". Pt is concern this medication has cause her AST and ALT to increase. Will forward to MD and nurse for advise.

## 2022-08-17 NOTE — Telephone Encounter (Signed)
See result note.  Latoya Wright. [name], MD 08/17/2022 12:24 PM

## 2022-08-18 NOTE — Telephone Encounter (Signed)
Spoke to the patient, explained Dr. Flora Lipps advise patient will like to know when should schedule her f/u. Will forward to MD and nurse for advise.

## 2022-08-18 NOTE — Telephone Encounter (Signed)
Patient wants to know if she should also stop taking her Aspirin and if it will be safe for her to so this.

## 2022-08-18 NOTE — Telephone Encounter (Signed)
Spoke to the patient, explained Dr. Flora Lipps advise: 2-3 months. They need to hold the statin and do some work-up on this.   Patient voiced understanding.

## 2022-08-18 NOTE — Telephone Encounter (Signed)
She was told to hold her statin and asked if she should hold her ASA.  Advised no she should continue ASA.  Also made follow up appt with Dr Scharlene Gloss for November

## 2022-08-20 ENCOUNTER — Ambulatory Visit: Admission: RE | Admit: 2022-08-20 | Payer: Medicare PPO | Source: Ambulatory Visit

## 2022-08-20 DIAGNOSIS — M810 Age-related osteoporosis without current pathological fracture: Secondary | ICD-10-CM | POA: Diagnosis not present

## 2022-08-20 DIAGNOSIS — Z0001 Encounter for general adult medical examination with abnormal findings: Secondary | ICD-10-CM

## 2022-08-23 NOTE — Progress Notes (Signed)
HEr bone density scan shows that she has osteoporosis.  Recommend she schedule appointment to discuss treatment options.

## 2022-08-27 ENCOUNTER — Ambulatory Visit (INDEPENDENT_AMBULATORY_CARE_PROVIDER_SITE_OTHER): Payer: Medicare PPO | Admitting: Plastic Surgery

## 2022-08-27 VITALS — BP 149/67 | HR 64

## 2022-08-27 DIAGNOSIS — D1721 Benign lipomatous neoplasm of skin and subcutaneous tissue of right arm: Secondary | ICD-10-CM

## 2022-08-27 DIAGNOSIS — Z9889 Other specified postprocedural states: Secondary | ICD-10-CM

## 2022-08-27 NOTE — Progress Notes (Signed)
The patient is a 77 year old female here for follow-up after having excision of several lipomas of her right arm.  The areas are healing nicely.  But I do want her to keep the Steri-Strips on for another week or 2.  She would like to have more removed.  We can do this but I want to be very careful to not remove too much at 1 time to make sure she can care for herself and that she heals well.  Will plan on doing this in the office.

## 2022-09-01 ENCOUNTER — Encounter: Payer: Self-pay | Admitting: Family Medicine

## 2022-09-01 ENCOUNTER — Ambulatory Visit: Payer: Medicare PPO | Admitting: Family Medicine

## 2022-09-01 ENCOUNTER — Ambulatory Visit: Payer: Medicare PPO

## 2022-09-01 VITALS — BP 150/70 | HR 68 | Temp 97.7°F | Ht 68.0 in | Wt 106.2 lb

## 2022-09-01 DIAGNOSIS — M810 Age-related osteoporosis without current pathological fracture: Secondary | ICD-10-CM | POA: Diagnosis not present

## 2022-09-01 DIAGNOSIS — K8020 Calculus of gallbladder without cholecystitis without obstruction: Secondary | ICD-10-CM | POA: Diagnosis not present

## 2022-09-01 DIAGNOSIS — E785 Hyperlipidemia, unspecified: Secondary | ICD-10-CM

## 2022-09-01 DIAGNOSIS — K769 Liver disease, unspecified: Secondary | ICD-10-CM

## 2022-09-01 LAB — COMPREHENSIVE METABOLIC PANEL
ALT: 100 U/L — ABNORMAL HIGH (ref 0–35)
AST: 76 U/L — ABNORMAL HIGH (ref 0–37)
Albumin: 4 g/dL (ref 3.5–5.2)
Alkaline Phosphatase: 55 U/L (ref 39–117)
BUN: 23 mg/dL (ref 6–23)
CO2: 31 mEq/L (ref 19–32)
Calcium: 9.5 mg/dL (ref 8.4–10.5)
Chloride: 104 mEq/L (ref 96–112)
Creatinine, Ser: 0.8 mg/dL (ref 0.40–1.20)
GFR: 71.22 mL/min (ref >60.00)
Glucose, Bld: 81 mg/dL (ref 70–99)
Potassium: 4.3 mEq/L (ref 3.5–5.1)
Sodium: 140 mEq/L (ref 135–145)
Total Bilirubin: 0.8 mg/dL (ref 0.2–1.2)
Total Protein: 7.5 g/dL (ref 6.0–8.3)

## 2022-09-01 NOTE — Assessment & Plan Note (Signed)
Incidentally found on recent right upper quadrant ultrasound.  Does not have any signs or symptoms of biliary colic.  Given that she is asymptomatic, do not need to do any further workup for this at this point.  She will let us know if she develops any symptoms.  We discussed reasons to return to care and seek emergent care.

## 2022-09-01 NOTE — Patient Instructions (Signed)
It was very nice to see you today!  We will recheck your liver and today.  I will send in the MRI request over to [facility].  Please continue to work on your diet and exercise.  Please continue to monitor your blood pressure at home and let us know if it is persistently elevated.  We will repeat your appointment DEXA scan in 2 years.  Take care, Dr [name]  PLEASE NOTE:  If you had any lab tests, please let us know if you have not heard back within a few days. You may see your results on mychart before we have a chance to review them but we will give you a call once they are reviewed by [place].   If we ordered any referrals today, please let us know if you have not heard from their office within the next week.   If you had any urgent prescriptions sent in today, please check with the pharmacy within an hour of our visit to make sure the prescription was transmitted appropriately.   Please try these tips to maintain a healthy lifestyle:  Eat at least 3 REAL meals and 1-2 snacks per day.  Aim for no more than 5 hours between eating.  If you eat breakfast, please do so within one hour of getting up.   Each meal should contain half fruits/vegetables, one quarter protein, and one quarter carbs (no bigger than a computer mouse)  Cut down on sweet beverages. This includes juice, soda, and sweet tea.   Drink at least 1 glass of water with each meal and aim for at least 8 glasses per day  Exercise at least 150 minutes every week.

## 2022-09-01 NOTE — Assessment & Plan Note (Signed)
Holding statin for now due to recent LFT elevation.  Previously on Lipitor 40 mg daily.  Will defer further management to cardiology.  Does likely need to be on some form of lipid control due to history of stroke.

## 2022-09-01 NOTE — Progress Notes (Signed)
   Latoya Wright is a 77 y.o. female who presents today for an office visit.  Assessment/Plan:  New/Acute Problems: Elevated transaminases Unclear etiology at this point.  Will recheck c-Met today.  She has been off statin for the last few weeks per cardiology -is possible this could have caused her elevated LFTs.  We did check right upper quadrant ultrasound which demonstrated a echogenic area that needs further workup with a liver MRI.  This order is pending hopefully she will be able to get this done soon.  Chronic Problems Addressed Today: Osteoporosis Recent T score -2.7. She would like to stop her Prolia due due to concern for side effects.  She is on calcium vitamin D supplementation.  She would like to work and focus on weightbearing exercises.  Her T-score did improve from DEXA scan a couple of years ago.  She will continue with above and we can repeat DEXA scan in 2 years.  Cholelithiasis Incidentally found on recent right upper quadrant ultrasound.  Does not have any signs or symptoms of biliary colic.  Given that she is asymptomatic, do not need to do any further workup for this at this point.  She will let us know if she develops any symptoms.  We discussed reasons to return to care and seek emergent care.  Dyslipidemia Holding statin for now due to recent LFT elevation.  Previously on Lipitor 40 mg daily.  Will defer further management to cardiology.  Does likely need to be on some form of lipid control due to history of stroke.     Subjective:  HPI:  See A/P for status of chronic condition.  Patient is here today for follow-up.  We last saw her about 6 weeks ago for an annual physical.  At that time noted to have elevated transaminases with AST of 149 and ALT of 179.  We repeated several days later and AST was 130 and ALT was 157.  We then went forward with right upper quadrant ultrasound which demonstrated a 5.4 x 2.9 x 4.0 echogenic area.  Radiology recommended MRI.  Also was  incidentally found to have cholelithiasis.  Denies any postprandial abdominal pain, bloating, or nausea.  She did contact her cardiologist in the last visit who recommended she stop her statin.  She has been off this for the last several weeks.       Objective:  Physical Exam: BP (!) 150/70   Pulse 68   Temp 97.7 F (36.5 C) (Temporal)   Ht 5\' 8"  (1.727 m)   Wt 106 lb 3.2 oz (48.2 kg)   SpO2 98%   BMI 16.15 kg/m   Gen: No acute distress, resting comfortably Neuro: Grossly normal, moves all extremities Psych: Normal affect and thought content       M. [name], MD 09/01/2022 10:12 AM

## 2022-09-01 NOTE — Assessment & Plan Note (Addendum)
Recent T score -2.7. She would like to stop her Prolia due due to concern for side effects.  She is on calcium vitamin D supplementation.  She would like to work and focus on weightbearing exercises.  Her T-score did improve from DEXA scan a couple of years ago.  She will continue with above and we can repeat DEXA scan in 2 years.

## 2022-09-06 ENCOUNTER — Other Ambulatory Visit: Payer: Self-pay | Admitting: *Deleted

## 2022-09-06 DIAGNOSIS — K769 Liver disease, unspecified: Secondary | ICD-10-CM

## 2022-09-06 IMAGING — CT CT L SPINE W/O CM
3 of 4 series · 9 of 33 positions shown, 11 images · non-contrast
Comparison: Chest CT 10/31/2013, two-view chest radiograph
01/16/2017

CLINICAL DATA: Low back pain, trauma; Low back pain, increased
fracture risk

EXAM:
CT THORACIC AND LUMBAR SPINE WITHOUT CONTRAST
TECHNIQUE: Multidetector CT imaging of the thoracic and lumbar spine was
performed without contrast. Multiplanar CT image reconstructions
were also generated.

[Series 5: coronal bone · coronal · 0.25mm/px · 3 of 61 slices shown]
[im 13/61  bone]
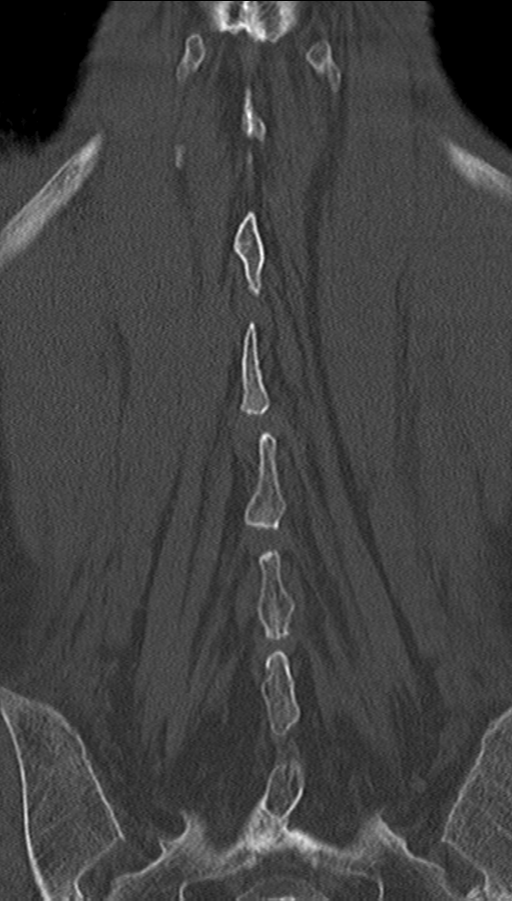
[im 25/61  bone]
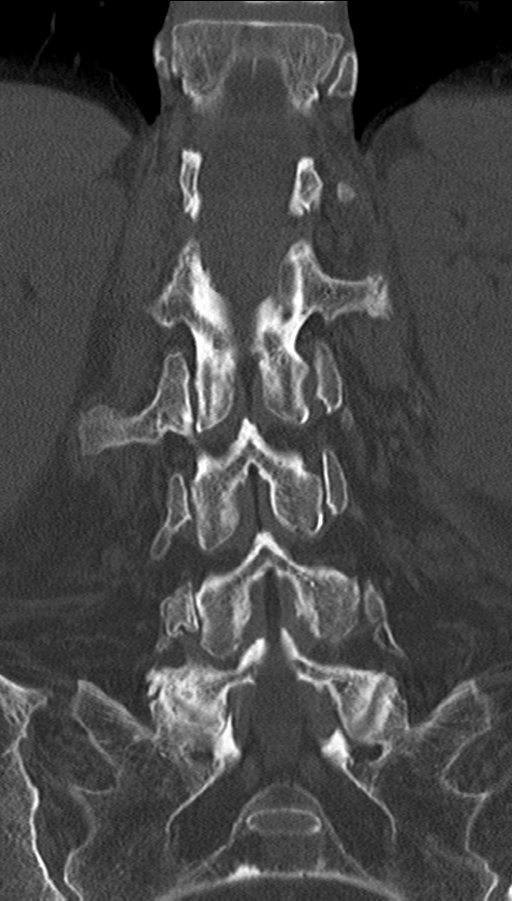
[im 37/61  bone]
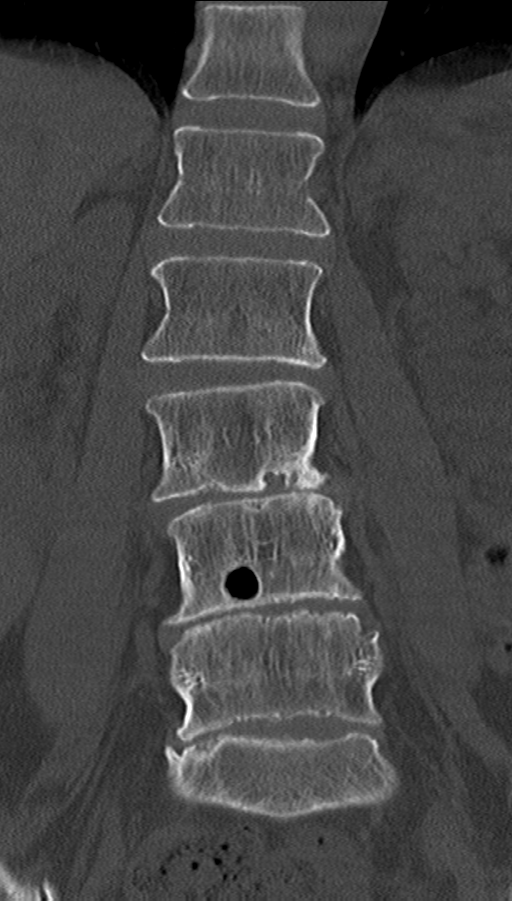

[Series 7: sagittal soft tissue · sagittal · 0.23mm/px · 5 of 65 slices shown, 6 images]
[im 22/65  bone]
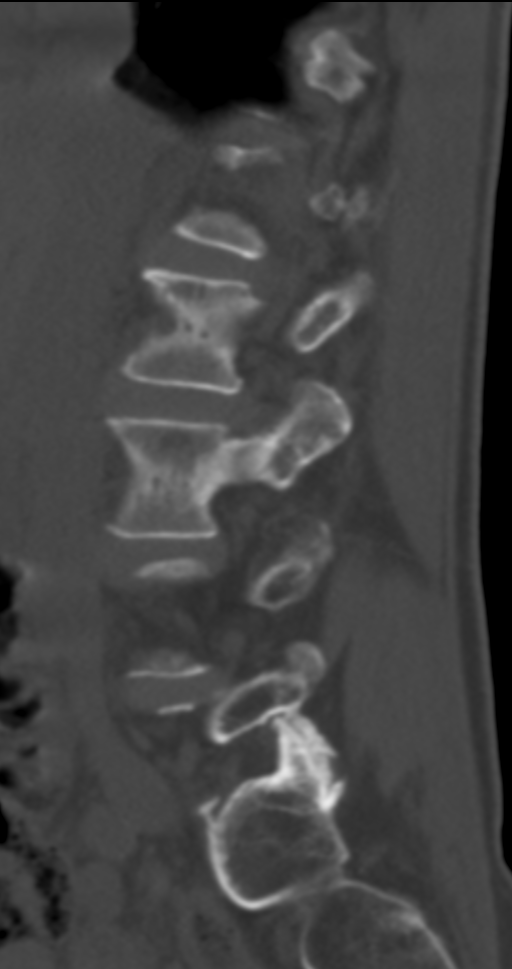
[im 27/65  bone]
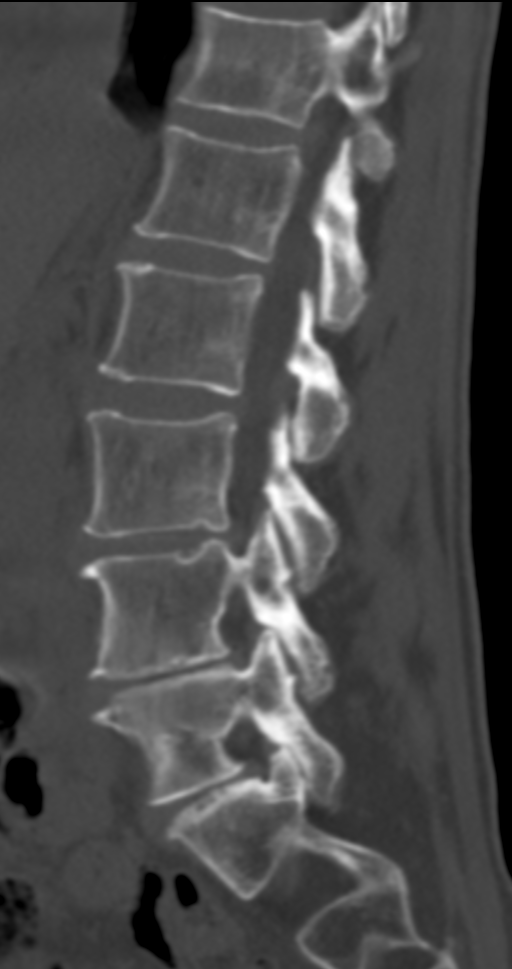
[im 33/65  soft-tissue]
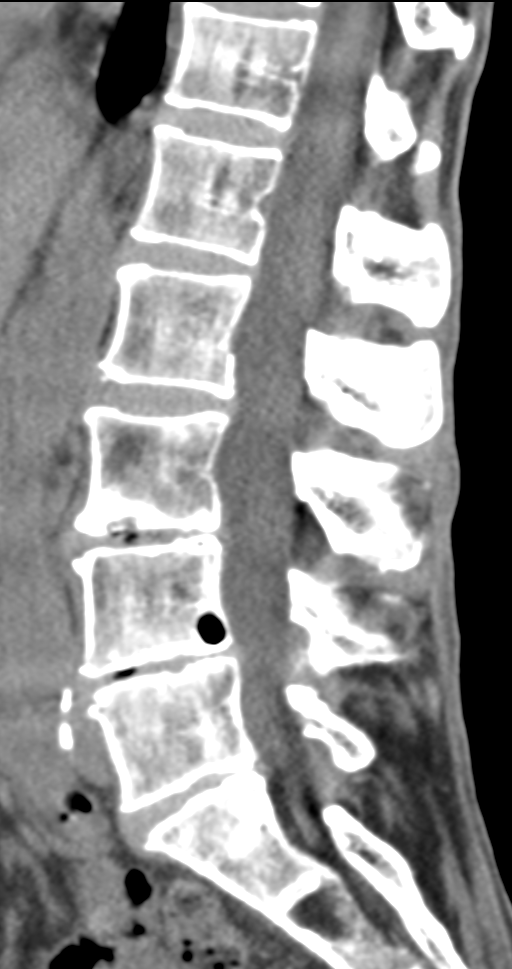
[im 33/65  bone]
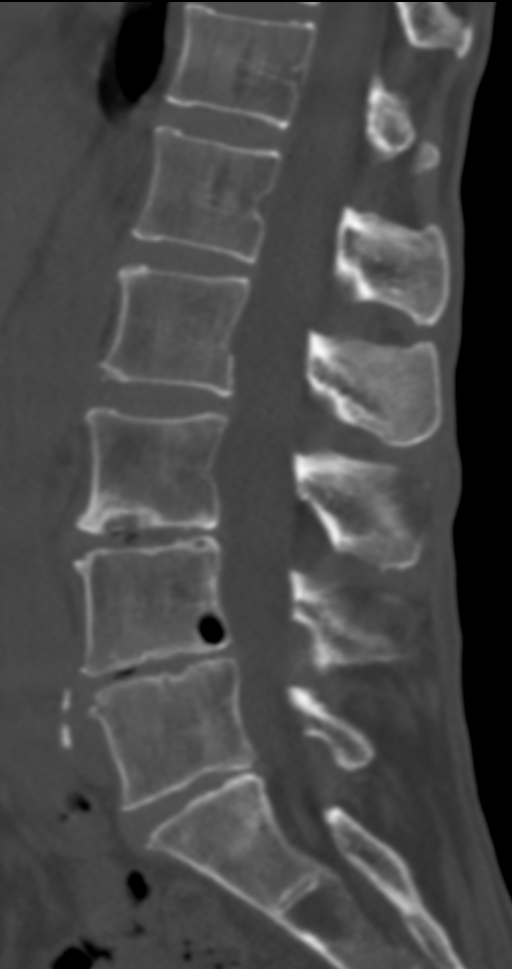
[im 38/65  bone]
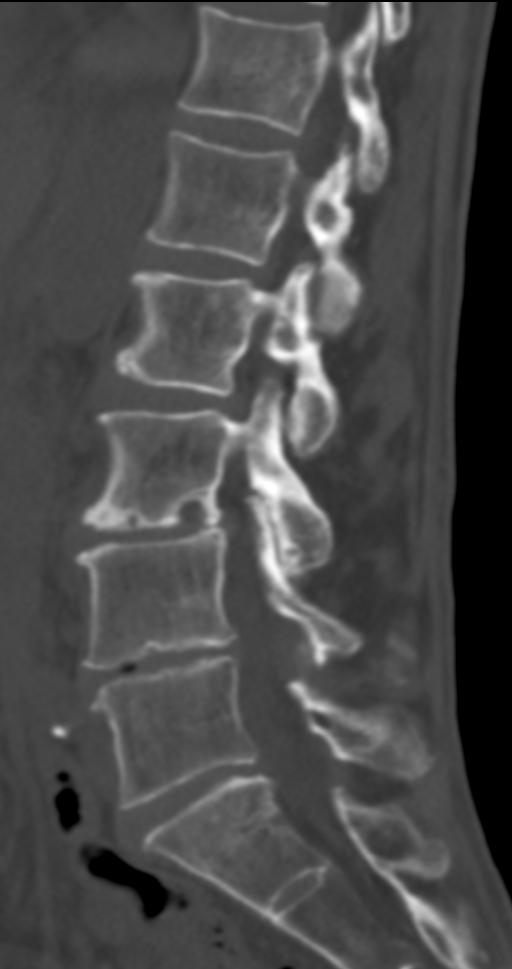
[im 43/65  bone]
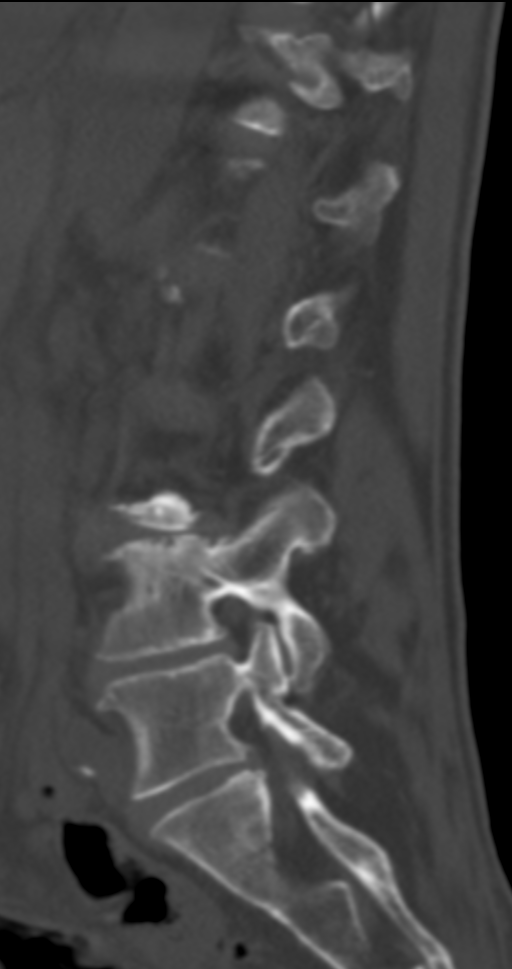

[Series 8: ax disc · axial · 0.21mm/px · z∈[+1173,+1173]mm · 1 of 114 slices shown, 2 images]
[im 57/114  soft-tissue]
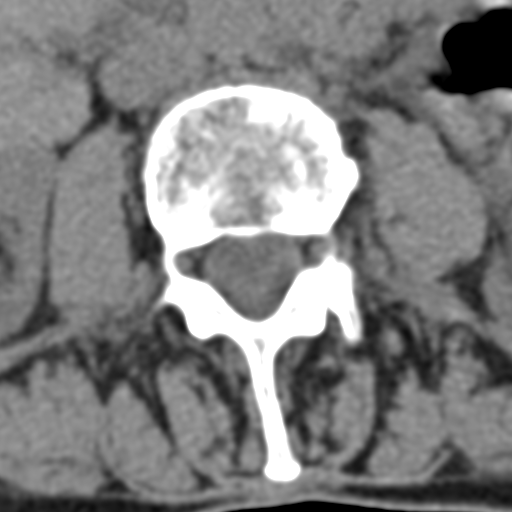
[im 57/114  bone]
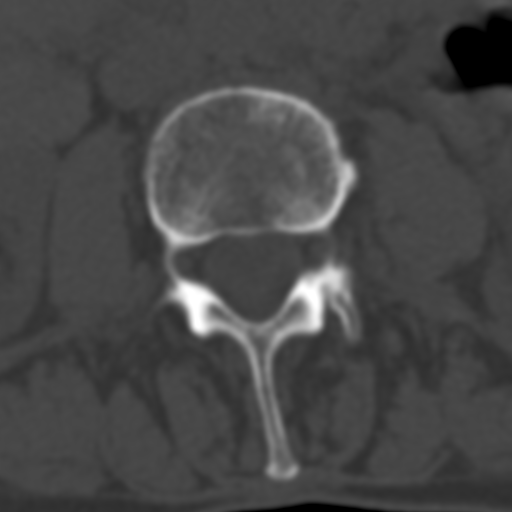

[9 of 33 positions shown; findings below may reference images not displayed]

FINDINGS: CT THORACIC SPINE FINDINGS

Alignment: Normal.

Vertebrae: There is an anterior compression fracture of T7 with
approximately 30% height loss. No bony retropulsion.

Paraspinal and other soft tissues: Subcentimeter right thyroid
nodule which requires no follow-up. Biapical pleuroparenchymal
scarring.

Disc levels: Mild multilevel degenerative disc disease throughout
the thoracic spine. No visible impingement.

CT LUMBAR SPINE FINDINGS

Segmentation: 5 lumbar type vertebrae.

Alignment: Normal.

Vertebrae: There are subacute appearing, nondisplaced fractures of
the left L2, L3, and L4 transverse processes. There is a subacute
appearing, nondisplaced left posterior twelfth rib fracture. No
evidence of vertebral body compression fracture.

Paraspinal and other soft tissues: Multiple left-sided renal sinus
cysts. Aortoiliac atherosclerotic calcifications. No acute
abnormality. Paraspinal muscle atrophy more prominent in the lower
aspect.

Disc levels: There is multilevel degenerative disc disease and worst
at L3-L4 and L4-L5 with moderate to severe disc height loss. There
is multilevel disc bulging and bilateral facet arthropathy resulting
in probable mild spinal canal stenosis at L3-L4 and L4-L5. The
neural foramina appear patent on noncontrast CT.
IMPRESSION: CT THORACIC SPINE IMPRESSION

Anterior compression fracture of T7 with approximately 30% height
loss. No bony retropulsion.

CT LUMBAR SPINE IMPRESSION

Nondisplaced fractures of the left L2, L3, and L4 transverse
processes as well as the left posterior twelfth rib. These fractures
appear subacute. No evidence of vertebral body compression fracture
in the lumbar spine.

Multilevel degenerative changes of the spine as described above.

## 2022-09-06 NOTE — Progress Notes (Signed)
Good news!  Liver numbers are improving.  Will contact her once we get results back on her imaging.  Will probably need repeat testing again in a few weeks.

## 2022-09-08 ENCOUNTER — Other Ambulatory Visit: Payer: Self-pay | Admitting: *Deleted

## 2022-09-14 ENCOUNTER — Encounter: Payer: Self-pay | Admitting: Plastic Surgery

## 2022-09-14 ENCOUNTER — Ambulatory Visit: Payer: Medicare PPO | Admitting: Plastic Surgery

## 2022-09-14 VITALS — BP 177/93 | HR 75

## 2022-09-14 DIAGNOSIS — D1721 Benign lipomatous neoplasm of skin and subcutaneous tissue of right arm: Secondary | ICD-10-CM

## 2022-09-14 NOTE — Progress Notes (Signed)
Procedure Note  Preoperative Dx: lipoma of right arm x 4  Postoperative Dx: Same  Procedure: Excision of lipoma right arm x 4 total of 2 cm  Anesthesia: Lidocaine 1% with 1:100,000 epinephrine  Indication for Procedure: lipoma  Description of Procedure: Risks and complications were explained to the patient.  Consent was confirmed and the patient understands the risks and benefits.  The potential complications and alternatives were explained and the patient consents.  The patient expressed understanding the option of not having the procedure and the risks of a scar.  Time out was called and all information was confirmed to be correct.    Proximal Volar: The area was prepped and drapped.  Lidocaine 1% with epinephrine was injected in the subcutaneous area.  After waiting several minutes for the local to take affect a #15 blade was used to incise the skin over the lesion.  The tissue scissors were used to excise the lesion that was in the soft tissue. The skin edges were reapproximated with 6-0 Monocryl.    Radial:The area was prepped and drapped.  Lidocaine 1% with epinephrine was injected in the subcutaneous area.  After waiting several minutes for the local to take affect a #15 blade was used to incise the skin over the lesion.  The tissue scissors were used to excise the lesion that was in the soft tissue. And this one had two distinct lipomas that were removed. The skin edges were reapproximated with 6-0 Monocryl.    Distal Volar: The area was prepped and drapped.  Lidocaine 1% with epinephrine was injected in the subcutaneous area.  After waiting several minutes for the local to take affect a #15 blade was used to incise the skin over the lesion.  The tissue scissors were used to excise the lesion that was in the soft tissue. The skin edges were reapproximated with 6-0 Monocryl.   A dressing was applied.  The patient was given instructions on how to care for the area and a follow up  appointment.  Latoya Wright tolerated the procedure well and there were no complications. The specimen was sent to pathology.

## 2022-09-28 ENCOUNTER — Other Ambulatory Visit (INDEPENDENT_AMBULATORY_CARE_PROVIDER_SITE_OTHER): Payer: Medicare PPO

## 2022-09-28 ENCOUNTER — Ambulatory Visit (INDEPENDENT_AMBULATORY_CARE_PROVIDER_SITE_OTHER): Payer: Medicare PPO | Admitting: Student

## 2022-09-28 ENCOUNTER — Telehealth: Payer: Self-pay | Admitting: Plastic Surgery

## 2022-09-28 VITALS — BP 165/76 | HR 75

## 2022-09-28 DIAGNOSIS — Z9889 Other specified postprocedural states: Secondary | ICD-10-CM

## 2022-09-28 DIAGNOSIS — D1721 Benign lipomatous neoplasm of skin and subcutaneous tissue of right arm: Secondary | ICD-10-CM

## 2022-09-28 DIAGNOSIS — K769 Liver disease, unspecified: Secondary | ICD-10-CM

## 2022-09-28 LAB — COMPREHENSIVE METABOLIC PANEL
ALT: 26 U/L (ref 0–35)
AST: 31 U/L (ref 0–37)
Albumin: 3.9 g/dL (ref 3.5–5.2)
Alkaline Phosphatase: 57 U/L (ref 39–117)
BUN: 20 mg/dL (ref 6–23)
CO2: 30 meq/L (ref 19–32)
Calcium: 9.5 mg/dL (ref 8.4–10.5)
Chloride: 104 meq/L (ref 96–112)
Creatinine, Ser: 0.88 mg/dL (ref 0.40–1.20)
GFR: 63.49 mL/min (ref >60.00)
Glucose, Bld: 78 mg/dL (ref 70–99)
Potassium: 4.3 meq/L (ref 3.5–5.1)
Sodium: 140 meq/L (ref 135–145)
Total Bilirubin: 0.9 mg/dL (ref 0.2–1.2)
Total Protein: 7.2 g/dL (ref 6.0–8.3)

## 2022-09-28 NOTE — Telephone Encounter (Signed)
Latoya Wright called to reschedule appt for tomorrow because her mother has covid. Pt has in stitches so she asked to be seen at the end of day today. [facility] did not have availability and [facility] slots did not align with the patients desired time to come in. She is asking if its okay to keep her stitches in for another full week. She is requesting a call back with this information.

## 2022-09-28 NOTE — Telephone Encounter (Signed)
Pt was seen in clinic today by Alan Ripper, [place].

## 2022-09-28 NOTE — Progress Notes (Signed)
Great news! Liver numbers are back to normal. We can recheck next office visit.  chest pain

## 2022-09-28 NOTE — Progress Notes (Signed)
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  She underwent excision of the lipomas with Dr. Ulice Bold on 09/14/2022.  During the procedure, the proximal volar, radial x 2, and distal volar lipomas were excised and the skin edges were reapproximated with 6-0 Monocryl.  Patient presents to the clinic for postprocedural follow-up.  Today, patient reports she is doing well.  She denies any issues with the surgical sites.  States that the Steri-Strips are still in place.  Denies any fevers or chills.  Denies any drainage.  Patient's blood pressure elevated at today's visit.  Patient states that she always has an elevated blood pressure at the doctor's office and also states that she is stressed because her mom has COVID.  Discussed with patient to retake her blood pressure when she gets home because she is does states she has a blood pressure cuff at home.  Discussed with her that if it remains elevated, to follow-up with her PCP.  Patient expressed understanding.  On exam, incisions are intact to the right arm with Steri-Strips.  Steri-Strips were removed.  Incisions are overall intact with Monocryl suture.  Several Monocryl sutures were cut and removed.  Patient tolerated well.  There is no surrounding erythema.  No drainage.  No signs of infection on exam.  Discussed with patient to apply Vaseline to her incisions daily for the next week or so, and then transition to scar creams.  Discussed with patient to avoid sun exposure as this may worsen the scar.  Recommended she cover the area or apply sunscreen if out in the sun.  Patient expressed understanding.  Patient states that she plans to continue for removal of other lipomas to her arms.  She states that she will call to make this appointment with Dr. Ulice Bold.  I instructed the patient to call back in the meantime if she has any questions or concerns about anything.

## 2022-09-29 ENCOUNTER — Ambulatory Visit: Payer: Medicare PPO | Admitting: Physician Assistant

## 2022-10-10 ENCOUNTER — Ambulatory Visit
Admission: RE | Admit: 2022-10-10 | Discharge: 2022-10-10 | Disposition: A | Discharge status: Home / Self Care | Payer: Medicare PPO | Source: Ambulatory Visit | Attending: Family Medicine | Admitting: Family Medicine

## 2022-10-10 DIAGNOSIS — K769 Liver disease, unspecified: Secondary | ICD-10-CM

## 2022-10-10 MED ORDER — GADOPICLENOL 0.5 MMOL/ML IV SOLN
5.0000 mL | Freq: Once | INTRAVENOUS | Status: AC | PRN
  Administered 2022-10-10: 5 mL via INTRAVENOUS

## 2022-10-12 ENCOUNTER — Telehealth: Payer: Self-pay | Admitting: Plastic Surgery

## 2022-10-12 NOTE — Telephone Encounter (Signed)
Pt called and wants to ask Dr D when she's able to have more lipomas removed. She is requesting a call back.

## 2022-10-13 ENCOUNTER — Telehealth: Payer: Self-pay | Admitting: Plastic Surgery

## 2022-10-13 NOTE — Telephone Encounter (Signed)
Pt called and we sch her for another procedure for lipoma removal

## 2022-10-18 NOTE — Progress Notes (Signed)
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  This should not cause her any issues going forward.  Do not need to do any other testing at this point.

## 2022-10-20 ENCOUNTER — Other Ambulatory Visit: Payer: Self-pay | Admitting: Cardiovascular Disease

## 2022-11-10 ENCOUNTER — Ambulatory Visit: Payer: Medicare PPO | Admitting: Family Medicine

## 2022-11-10 ENCOUNTER — Encounter: Payer: Self-pay | Admitting: Family Medicine

## 2022-11-10 VITALS — BP 118/68 | HR 74 | Temp 97.5°F | Ht 68.0 in | Wt 106.2 lb

## 2022-11-10 DIAGNOSIS — F4321 Adjustment disorder with depressed mood: Secondary | ICD-10-CM

## 2022-11-10 NOTE — Progress Notes (Signed)
   Latoya Wright is a 77 y.o. female who presents today for an office visit.  Assessment/Plan:  Chronic Problems Addressed Today: Adjustment disorder with depressed mood Had lengthy discussion with patient regarding her mood today.  No SI or HI.  PHQ score is minimally elevated however she is concerned about her worsening irritability. We did discuss treatment options including referral to therapy.  She would like to hold off on therapy referral at this point as well.  She would like to avoid medications at this point as well.  Discussed with patient is very common to have mood and irritability issues after stroke.  We discussed treatment options.  She is interested in trying over-the-counter supplements first before pursuing prescription medications or therapy.  She is already on magnesium.  Discussed with patient that I am not aware of much evidence supporting use of treatment for adjustment disorder and stroke patients however may be reasonable for her to try N-acetylcysteine as this has given some evidence for improvement in anxiety scores.  She will follow-up with [place] in a week or 2 via MyChart.  We can discuss medications or psychotherapy at that point if needed.     Subjective:  HPI:  See Assessment / plan for status of chronic conditions. Her main concern today is worsening mood. She has been noticing more irritability and stress.  She feels like her appetite has been normal.  Feels guilty about losing her temper.  She does later apologized to her husband however she would like to prevent and reduce her irritability.  No SI or HI.       Objective:  Physical Exam: BP 118/68   Pulse 74   Temp (!) 97.5 F (36.4 C) (Temporal)   Ht 5\' 8"  (1.727 m)   Wt 106 lb 3.2 oz (48.2 kg)   SpO2 (!) 74%   BMI 16.15 kg/m   Wt Readings from Last 3 Encounters:  11/10/22 106 lb 3.2 oz (48.2 kg)  09/01/22 106 lb 3.2 oz (48.2 kg)  07/19/22 108 lb (49 kg)    Gen: No acute distress, resting  comfortably Neuro: Grossly normal, moves all extremities Psych: Normal affect and thought content      Hilary Milks M. [name], MD 11/10/2022 8:44 AM

## 2022-11-10 NOTE — Assessment & Plan Note (Addendum)
Had lengthy discussion with patient regarding her mood today.  No SI or HI.  PHQ score is 1 however however she is concerned about her worsening irritability. We did discuss treatment options including referral to therapy.  She would like to hold off on therapy referral at this point as well.  She would like to avoid medications at this point as well.  Discussed with patient is very common to have mood and irritability issues after stroke.  We discussed treatment options.  She is interested in trying over-the-counter supplements first before pursuing prescription medications or therapy.  She is already on magnesium.  Discussed with patient that I am not aware of much evidence supporting use of treatment for adjustment disorder and stroke patients however may be reasonable for her to try N-acetylcysteine as this has given some evidence for improvement in anxiety scores.  She will follow-up with [place] in a week or 2 via MyChart.  We can discuss medications or psychotherapy at that point if needed.

## 2022-11-10 NOTE — Patient Instructions (Signed)
It was very nice to see you today!  You can try using N-acetylcysteine to see if this helps with your mood.  Please send a message in a few weeks let me know how you are doing.  Please let me know if you change your mind about medications or referral to see a therapist.  Return if symptoms worsen or fail to improve.   Take care, Dr [name]  PLEASE NOTE:  If you had any lab tests, please let us know if you have not heard back within a few days. You may see your results on mychart before we have a chance to review them but we will give you a call once they are reviewed by [place].   If we ordered any referrals today, please let us know if you have not heard from their office within the next week.   If you had any urgent prescriptions sent in today, please check with the pharmacy within an hour of our visit to make sure the prescription was transmitted appropriately.   Please try these tips to maintain a healthy lifestyle:  Eat at least 3 REAL meals and 1-2 snacks per day.  Aim for no more than 5 hours between eating.  If you eat breakfast, please do so within one hour of getting up.   Each meal should contain half fruits/vegetables, one quarter protein, and one quarter carbs (no bigger than a computer mouse)  Cut down on sweet beverages. This includes juice, soda, and sweet tea.   Drink at least 1 glass of water with each meal and aim for at least 8 glasses per day  Exercise at least 150 minutes every week.

## 2022-11-26 ENCOUNTER — Ambulatory Visit: Payer: Medicare PPO | Admitting: Plastic Surgery

## 2022-11-26 ENCOUNTER — Encounter: Payer: Self-pay | Admitting: Cardiovascular Disease

## 2022-11-26 ENCOUNTER — Encounter: Payer: Self-pay | Admitting: Plastic Surgery

## 2022-11-26 ENCOUNTER — Ambulatory Visit: Payer: Medicare PPO | Attending: Cardiovascular Disease | Admitting: Cardiovascular Disease

## 2022-11-26 VITALS — BP 169/71 | HR 77

## 2022-11-26 VITALS — BP 156/64 | HR 68 | Ht 68.0 in | Wt 107.6 lb

## 2022-11-26 DIAGNOSIS — D1722 Benign lipomatous neoplasm of skin and subcutaneous tissue of left arm: Secondary | ICD-10-CM

## 2022-11-26 DIAGNOSIS — E782 Mixed hyperlipidemia: Secondary | ICD-10-CM | POA: Diagnosis not present

## 2022-11-26 DIAGNOSIS — D1721 Benign lipomatous neoplasm of skin and subcutaneous tissue of right arm: Secondary | ICD-10-CM

## 2022-11-26 DIAGNOSIS — I471 Supraventricular tachycardia, unspecified: Secondary | ICD-10-CM

## 2022-11-26 DIAGNOSIS — I672 Cerebral atherosclerosis: Secondary | ICD-10-CM | POA: Diagnosis not present

## 2022-11-26 DIAGNOSIS — I10 Essential (primary) hypertension: Secondary | ICD-10-CM | POA: Diagnosis not present

## 2022-11-26 NOTE — Progress Notes (Signed)
Procedure Note  Preoperative Dx: left arm masses  Postoperative Dx: Same  Procedure: excision of left arm lipoma proximal and distal 2 cm each  Anesthesia: Lidocaine 1% with 1:100,000 epinephrine  Indication for Procedure: lipoma  Description of Procedure: Risks and complications were explained to the patient.  Consent was confirmed and the patient understands the risks and benefits.  The potential complications and alternatives were explained and the patient consents.  The patient expressed understanding the option of not having the procedure and the risks of a scar.  Time out was called and all information was confirmed to be correct.    The area was prepped and drapped.  Lidocaine 1% with epinephrine was injected in the subcutaneous area.  After waiting several minutes for the local to take affect a #15 blade was used to incise the skin over the area of the proximal mass.  The tissue scissors were used to free the lesion from the surrounding tissue.  It was excised without difficulty.  The skin edges were reapproximated with 5-0 Monocryl.   The area was prepped and drapped.  Lidocaine 1% with epinephrine was injected in the subcutaneous area.  After waiting several minutes for the local to take affect a #15 blade was used to incise the skin over the area of the distal mass.  The tissue scissors were used to free the lesion from the surrounding tissue.  It was excised without difficulty.  The skin edges were reapproximated with 5-0 Monocryl.    A dressing was applied.  The patient was given instructions on how to care for the area and a follow up appointment.  Latoya Wright tolerated the procedure well and there were no complications. The specimen were not sent to pathology.

## 2022-11-26 NOTE — Patient Instructions (Signed)
Medication Instructions:  No changes     *If you need a refill on your cardiac medications before your next appointment, please call your pharmacy*   Lab Work: NONE    If you have labs (blood work) drawn today and your tests are completely normal, you will receive your results only by: MyChart Message (if you have MyChart) OR A paper copy in the mail If you have any lab test that is abnormal or we need to change your treatment, we will call you to review the results.   Testing/Procedures: None    Follow-Up: At [facility], you and your health needs are our priority.  As part of our continuing mission to provide you with exceptional heart care, we have created designated Provider Care Teams.  These Care Teams include your primary Cardiologist (physician) and Advanced Practice Providers (APPs -  Physician Assistants and Nurse Practitioners) who all work together to provide you with the care you need, when you need it.  We recommend signing up for the patient portal called "MyChart".  Sign up information is provided on this After Visit Summary.  MyChart is used to connect with patients for Virtual Visits (Telemedicine).  Patients are able to view lab/test results, encounter notes, upcoming appointments, etc.  Non-urgent messages can be sent to your provider as well.   To learn more about what you can do with MyChart, go to [email or web address].    Your next appointment:   3 month(s)  The format for your next appointment:   In Person  Provider:   Edd Fabian, FNP, Micah Flesher, PA-C, Marjie Skiff, PA-C, Robet Leu, PA-C, Juanda Crumble, PA-C, Joni Reining, DNP, ANP, Azalee Course, PA-C, or Bernadene Person, NP    Then, Velna Ochs, MD will plan to see you again in 1 year(s).   Other Instructions Dr. Scharlene Gloss recommends to check your blood pressure once a day and keep a log until your next appointment.

## 2022-12-09 NOTE — Progress Notes (Signed)
Patient is a pleasant 77 year old female s/p excision of left arm lipoma x 2 performed in office by Dr. Ulice Bold on 11/26/2022 who returns to clinic for postprocedural follow-up.  Reviewed procedure note and the excision sites were closed with 5-0 Monocryl.  Pathology was not sent.  Today, patient is doing well.  No specific complaints.  Steri-Strips gently removed.  The underlying simple interrupted Monocryl sutures were trimmed at bedside without complication or difficulty.  Incisions well-approximated, healing nicely.  No incisional wounds.  No surrounding erythema.  No evidence concerning for infection.  Excision sites are mildly dry appearing, recommending Vaseline once daily for the next week and then she can begin scar treatments if desired.  She reports that she has had innumerable similar lipomas removed from her arms in the past.  She plans to follow-up with Dr. Ulice Bold again in the future for additional excisions.

## 2022-12-10 ENCOUNTER — Ambulatory Visit (INDEPENDENT_AMBULATORY_CARE_PROVIDER_SITE_OTHER): Payer: Medicare PPO | Admitting: Physician Assistant

## 2022-12-10 ENCOUNTER — Telehealth: Payer: Self-pay | Admitting: Plastic Surgery

## 2022-12-10 ENCOUNTER — Encounter: Payer: Self-pay | Admitting: Physician Assistant

## 2022-12-10 VITALS — BP 167/73 | HR 69 | Ht 68.0 in | Wt 108.2 lb

## 2022-12-10 DIAGNOSIS — R2232 Localized swelling, mass and lump, left upper limb: Secondary | ICD-10-CM | POA: Diagnosis not present

## 2022-12-10 NOTE — Telephone Encounter (Signed)
error 

## 2022-12-20 ENCOUNTER — Telehealth: Payer: Self-pay | Admitting: Plastic Surgery

## 2022-12-20 NOTE — Telephone Encounter (Signed)
Patient would like to talk about a procedure that you had discussed with her previously, asked if you could give her a call.

## 2023-01-28 NOTE — Telephone Encounter (Signed)
 Sent patient MyChart message yesterday inquiring what procedure she was referring to. Pt has not responded yet.

## 2023-02-19 NOTE — Progress Notes (Signed)
 Cardiology Office Note:    Date:  03/01/2023   ID:  Latoya Wright, DOB [birth year], MRN [id number]  PCP:  Kennyth Worth HERO, MD  Cardiologist:  Darryle ONEIDA Decent, MD     Referring MD: Kennyth Worth HERO, MD   Chief Complaint: follow-up of hypertension  History of Present Illness:    Latoya Wright is a 78 y.o. female with a history of palpitations with brief SVT and rare PACs/ PVCs noted on monitor in 03/2021,, CVA in 09/2020, hypertension, and hyperlipidemia who is followed by Dr. Decent and presents today for follow-up of hypertension.   Patient has a history of a small vessel stroke in 09/2020. Echo at that time showed LVEF of 60-65% with normal wall motion and no significant valvular disease. Carotid dopplers showed on minimal 1-39% stenosis of bilateral ICAs. Monitor was ordered in 03/2021 for further evaluation of palpitations which showed predominant sinus rhythm with 13 short runs of SVT (longest run 9.4 seconds) and rare PACs/ PVCs but no significant arrhythmias.   She was last seen by Dr. Decent in 11/2022 at which time she was doing well. BP was mildly elevated in the office. She was not on any medications for this at home. She was advised to monitor BP at home and then follow-up in 3 month to reassess BP.   Patient presents today for follow-up. She is doing well since last visit. She denies any chest pain, shortness of breath, orthopnea, PND, edema, palpitations, lightheadedness, dizziness, or near syncope. She is a very active 78 year old. She goes to the gym 5 days a week and participates in an osteoporosis exercise program. She also cleans the gym and does yard work. She has no issues with any of these activities.   Reviewed home BP/HR log. Systolic BP ranged from 126 to 158 but was in the 140s the majority of the time. Diastolic BP at goal. Heart rate ranged from the high 50s to low 70s but was mostly in the 60s.  EKGs/Labs/Other Studies Reviewed:    The following studies were reviewed  today:  Echocardiogram 10/17/2020: Impressions: 1. Left ventricular ejection fraction, by estimation, is 60 to 65%. The  left ventricle has normal function. The left ventricle has no regional  wall motion abnormalities. Left ventricular diastolic parameters are  indeterminate.   2. Right ventricular systolic function is normal. The right ventricular  size is normal. There is normal pulmonary artery systolic pressure.   3. The mitral valve is grossly normal. Trivial mitral valve  regurgitation. No evidence of mitral stenosis.   4. The aortic valve was not well visualized. Aortic valve regurgitation  is not visualized. No aortic stenosis is present.  _______________  Monitor 04/20/2021 to 04/27/2021:  Patient had a min HR of 53 bpm (sinus bradycardia), max HR of 197 bpm (supraventricular tachycardia, 2 second duration), and avg HR of 71 bpm (normal sinus rhythm). Predominant underlying rhythm was Sinus Rhythm. 13 Supraventricular Tachycardia runs occurred, the run with the fastest interval lasting 6 beats (2 seconds) with a max rate of 197 bpm, the longest lasting 9.4 secs with an avg rate of 138 bpm.Episodes appear to be brief atrial tachycardia. Isolated SVEs were rare (<1.0%), SVE Couplets were rare (<1.0%), and SVE Triplets were rare (<1.0%). Isolated VEs were rare (<1.0%), VE Couplets were rare (<1.0%), and no VE Triplets were present.   Impression: 1. Brief supraventricular tachycardia detected (13 episodes in 6 days; longest episode 9.4 seconds).  2. Rare ectopy.  EKG:  EKG not ordered today.   Recent Labs: 07/19/2022: Hemoglobin 13.2; Platelets 174.0; TSH 0.81 09/28/2022: ALT 26; BUN 20; Creatinine, Ser 0.88; Potassium 4.3; Sodium 140  Recent Lipid Panel    Component Value Date/Time   CHOL 148 07/19/2022 1030   CHOL 182 04/15/2021 0813   TRIG 49.0 07/19/2022 1030   HDL 71.10 07/19/2022 1030   HDL 102 04/15/2021 0813   CHOLHDL 2 07/19/2022 1030   VLDL 9.8 07/19/2022 1030    LDLCALC 67 07/19/2022 1030   LDLCALC 69 04/15/2021 0813    Physical Exam:    Vital Signs: BP (!) 148/66   Pulse 85   Ht 5' 8 (1.727 m)   Wt 110 lb (49.9 kg)   SpO2 97%   BMI 16.73 kg/m     Wt Readings from Last 3 Encounters:  03/01/23 110 lb (49.9 kg)  12/10/22 108 lb 3.2 oz (49.1 kg)  11/26/22 107 lb 9.6 oz (48.8 kg)     General: 78 y.o. thin Caucasian female in no acute distress. HEENT: Normocephalic and atraumatic. Sclera clear.  Neck: Supple. No carotid bruits. No JVD. Heart: RRR. Distinct S1 and S2. No murmurs, gallops, or rubs.  Lungs: No increased work of breathing. Clear to ausculation bilaterally. No wheezes, rhonchi, or rales.  Extremities: No lower extremity edema.  Skin: Warm and dry. Neuro: No focal deficits. Psych: Norml affect. Responds appropriately.   Assessment:    1. Primary hypertension   2. Palpitations   3. Hyperlipidemia, unspecified hyperlipidemia type   4. History of CVA (cerebrovascular accident)     Plan:    Hypertension BP elevated. Initial BP 118/56. However, I do not think this was accurate. I think there was a problem with the BP cuff. I got 148/66 on my personal recheck using a different cuff at the end of the visit and this is where her BP usually is at home per review of home BP log.  - She is hesitant to start any medication because her husband has previously struggled with side effects from BP medications. However, she was agreeable to started the lowest dose of something. Will start Amlodipine  2.5mg  daily.  - Advised patient to let us  know if systolic BP is consistently >140. We did discuss that BP goal is technically <130/80 but she is a very thin 78 year old who is concerned about side effects so I think systolic BP goal of <140 is okay.    Palpitations Patient has a history of palpitations. Monitor in 03/2021 showed predominant sinus rhythm with 13 short runs of SVT (longest run 9.4 seconds) and rare PACs/ PVCs but no significant  arrhythmias.  - Stable. No recent palpitations.   Hyperlipidemia Lipid panel in 06/2022: Total Cholesterol 148, Triglycerides 49, HDL 71, LDL 67.  - She was previously on a statin but this was stopped due to elevated LFTs and a diagnosis of fatty liver disease. - Continue to monitor. If LDL starts to rise, may need to consider non-statin therapy.   History of CVA History of CVA in 09/2020.  - Continue Aspirin  81mg  daily for secondary prevention.  - Not on statin as above.   Disposition: Follow up in 6 months.    Signed, Amzie Sillas E Bryndan Bilyk, PA-C  03/01/2023 10:14 AM    [facility]

## 2023-02-25 ENCOUNTER — Ambulatory Visit: Payer: Medicare PPO | Admitting: Plastic Surgery

## 2023-02-25 ENCOUNTER — Encounter: Payer: Self-pay | Admitting: Plastic Surgery

## 2023-02-25 VITALS — BP 174/81 | HR 68

## 2023-02-25 DIAGNOSIS — D1722 Benign lipomatous neoplasm of skin and subcutaneous tissue of left arm: Secondary | ICD-10-CM

## 2023-02-25 DIAGNOSIS — E785 Hyperlipidemia, unspecified: Secondary | ICD-10-CM

## 2023-02-25 NOTE — Progress Notes (Signed)
Procedure Note  Preoperative Dx: lipomas of left arm  Postoperative Dx: Same  Procedure: Excision of 4 left arm lipomas 3 cm total  Anesthesia: Lidocaine 1% with 1:100,000 epinephrine   Description of Procedure: Risks and complications were explained to the patient.  Consent was confirmed and the patient understands the risks and benefits.  The potential complications and alternatives were explained and the patient consents.  The patient expressed understanding the option of not having the procedure and the risks of a scar.  Time out was called and all information was confirmed to be correct.    The area was prepped and drapped.  Lidocaine 1% with epinephrine was injected in the subcutaneous area of all four areas of the .  After waiting several minutes for the local to take affect a #15 blade was used to incise the area over each of the four areas.  I was able to excise the lipomas totally.  The distal radial arm had three 7 mm lesion and the palmar side had one that was 9 mm in size  The skin edges were reapproximated with 6-0 Monocryl.  A dressing was applied.  The patient was given instructions on how to care for the area and a follow up appointment.  Latoya Wright tolerated the procedure well and there were no complications. The specimen were not sent to pathology.

## 2023-03-01 ENCOUNTER — Encounter: Payer: Self-pay | Admitting: Student

## 2023-03-01 ENCOUNTER — Ambulatory Visit: Payer: Medicare PPO | Attending: Student | Admitting: Student

## 2023-03-01 VITALS — BP 148/66 | HR 85 | Ht 68.0 in | Wt 110.0 lb

## 2023-03-01 DIAGNOSIS — I10 Essential (primary) hypertension: Secondary | ICD-10-CM | POA: Diagnosis not present

## 2023-03-01 DIAGNOSIS — E785 Hyperlipidemia, unspecified: Secondary | ICD-10-CM | POA: Diagnosis not present

## 2023-03-01 DIAGNOSIS — Z8673 Personal history of transient ischemic attack (TIA), and cerebral infarction without residual deficits: Secondary | ICD-10-CM

## 2023-03-01 DIAGNOSIS — R002 Palpitations: Secondary | ICD-10-CM | POA: Diagnosis not present

## 2023-03-01 MED ORDER — AMLODIPINE BESYLATE 2.5 MG PO TABS: 2.5000 mg | ORAL_TABLET | Freq: Every day | ORAL | 6 refills | Status: DC

## 2023-03-01 NOTE — Patient Instructions (Addendum)
 Medication Instructions:  START AMLODIPINE  2.5MG  DAILY  *If you need a refill on your cardiac medications before your next appointment, please call your pharmacy*  Lab Work: NONE If you have labs (blood work) drawn today and your tests are completely normal, you will receive your results only by:  MyChart Message (if you have MyChart) OR A paper copy in the mail If you have any lab test that is abnormal or we need to change your treatment, we will call you to review the results.  OTHER: TAKE YOUR BLOOD PRESSURE AND CALL IF >140  Follow-Up: At [facility], you and your health needs are our priority.  As part of our continuing mission to provide you with exceptional heart care, we have created designated Provider Care Teams.  These Care Teams include your primary Cardiologist (physician) and Advanced Practice Providers (APPs -  Physician Assistants and Nurse Practitioners) who all work together to provide you with the care you need, when you need it.  Your next appointment:   6 month(s)  Provider:   Darryle ONEIDA Decent, MD  or Callie Goodrich, PA-C

## 2023-03-07 ENCOUNTER — Encounter: Payer: Self-pay | Admitting: Physician Assistant

## 2023-03-07 ENCOUNTER — Ambulatory Visit (INDEPENDENT_AMBULATORY_CARE_PROVIDER_SITE_OTHER): Payer: Medicare PPO | Admitting: Physician Assistant

## 2023-03-07 VITALS — BP 158/68 | HR 73 | Ht 68.0 in | Wt 110.8 lb

## 2023-03-07 DIAGNOSIS — R2232 Localized swelling, mass and lump, left upper limb: Secondary | ICD-10-CM

## 2023-03-07 NOTE — Progress Notes (Signed)
 Patient is a pleasant 78 year old female s/p excision of 4 separate left arm lipomas performed 02/25/2023 in office by Dr. Orin Birk who presents to clinic for postprocedural follow-up.  Reviewed procedure note and the excision sites were all closed with 6-0 Monocryl.  Specimens were not sent to pathology.  Today, patient is doing well.  No specific issues with her recent procedure.  She feels.  For removal of Steri-Strips.  On exam, Steri-Strips removed without complication or difficulty.  Scattered Monocryl suture knots are removed.  Excision sites are all well-approximated, healing appropriately.  No surrounding irritation or overlying skin changes.  No residual palpable masses.  Recommending Vaseline to her excision sites 1-2 times daily for the next week.  She can then transition to a silicone scar gel if she would prefer.  She states that she already has an upcoming appointment with Dr. Orin Birk to discuss excision of mass from right mandibular margin.  Afterwards, she would like to proceed with BBL and Moxi laser.

## 2023-03-09 ENCOUNTER — Telehealth: Payer: Self-pay | Admitting: Plastic Surgery

## 2023-03-09 NOTE — Telephone Encounter (Signed)
Patient would like to speak to someone about procedure she is having, please reach out to her

## 2023-04-05 ENCOUNTER — Institutional Professional Consult (permissible substitution): Payer: Medicare PPO | Admitting: Plastic Surgery

## 2023-04-26 ENCOUNTER — Ambulatory Visit: Payer: Medicare PPO | Admitting: Plastic Surgery

## 2023-04-26 VITALS — BP 141/70 | HR 72

## 2023-04-26 DIAGNOSIS — D1722 Benign lipomatous neoplasm of skin and subcutaneous tissue of left arm: Secondary | ICD-10-CM

## 2023-04-26 NOTE — Progress Notes (Signed)
 Procedure Note  Preoperative Dx: lipoma left arm  Postoperative Dx: Same  Procedure: Excision of lipoma left arm x 3 (3 cm total size)  Anesthesia: Lidocaine 1% with 1:100,000 epinephrine   Description of Procedure: Risks and complications were explained to the patient.  Consent was confirmed and the patient understands the risks and benefits.  The potential complications and alternatives were explained and the patient consents.  The patient expressed understanding the option of not having the procedure and the risks of a scar.  Time out was called and all information was confirmed to be correct.    The area was prepped and drapped.  Lidocaine 1% with epinephrine was injected in the subcutaneous area.  After waiting several minutes for the local to take affect a #15 blade was used to incise the skin over the lesion.  The tissue scissors were used to excise the three lipomas.  The skin edges were reapproximated with 6-0 Monocryl subcuticular running closure.  A dressing was applied.  The patient was given instructions on how to care for the area and a follow up appointment.  Latoya Wright tolerated the procedure well and there were no complications.

## 2023-05-02 ENCOUNTER — Ambulatory Visit: Admitting: Physician Assistant

## 2023-05-02 ENCOUNTER — Encounter: Payer: Self-pay | Admitting: Physician Assistant

## 2023-05-02 VITALS — BP 137/58 | HR 75 | Ht 68.0 in | Wt 110.0 lb

## 2023-05-02 DIAGNOSIS — D1722 Benign lipomatous neoplasm of skin and subcutaneous tissue of left arm: Secondary | ICD-10-CM

## 2023-05-02 NOTE — Progress Notes (Signed)
 Patient is a pleasant 78 year old female s/p excision of left arm lipoma x 3 performed in office 04/26/2023 by Dr. Ulice Bold who returns to clinic for postprocedural follow-up.  Reviewed procedure note and the skin edges were reapproximated with 6-0 Monocryl subcuticular running closure.  No pathology was sent given history of prior.  Today, patient is doing okay.  She states that she had a fall at the gym 3 days after the excision.  There is considerable bruising around the excision site, but she states that some of the bruising may have been there prior to the fall.  She is not having any difficulty with range of motion and is overall just fine.  Most of her injuries to come from the fall was to her bilateral knees for which she reports seeing Dr. Thurston Hole and had negative imaging.  On exam, Steri-Strip is gently removed without complication.  Skin edges remain well-approximated.  There is a small but palpable underlying firmness, likely small hematoma.  Significant surrounding ecchymoses with various stages of healing.  No bony tenderness.  Distal pulse and refill intact.  No significant swelling.  The sutures are not removed given the small but palpable underlying firmness and bruising.  They are 6-0 Monocryl will likely absorb and/or breakdown without needing to be removed.  Placed bordered Mepilex dressing over top.  Will have her follow-up 1 more time to ensure that she has healed nicely from her procedure.  She can call the office should she have questions or concerns in interim.

## 2023-05-09 ENCOUNTER — Encounter: Payer: Self-pay | Admitting: Physician Assistant

## 2023-05-09 ENCOUNTER — Ambulatory Visit: Admitting: Physician Assistant

## 2023-05-09 ENCOUNTER — Encounter: Admitting: Physician Assistant

## 2023-05-09 VITALS — BP 170/68 | HR 72

## 2023-05-09 DIAGNOSIS — D1722 Benign lipomatous neoplasm of skin and subcutaneous tissue of left arm: Secondary | ICD-10-CM

## 2023-05-09 NOTE — Progress Notes (Signed)
 Patient is a pleasant 78 year old female s/p excision of left arm lipoma x 3 performed in office 04/26/2023 by Dr. Orin Birk who returns to clinic for postprocedural follow-up.   She was last seen here in clinic on 05/02/2023.  At that time, she was doing well.  Steri-Strips removed without complication or difficulty.  She had sustained a mechanical fall not long prior to the evaluation in clinic.  Considerable bruising around the excision site, possibly contributed by the fall.  Today, patient is doing well.  Her bruising is significantly improved.  The underlying area of firmness at the excision site has also softened since last encounter.  She is pleased with however then looks it is not interested in any scar mitigation strategies.  On exam, excision sites appear well-healed.  Resolving ecchymoses.  Radial pulse intact.  AROM fully intact throughout.  Follow-up as needed.  Patient will call the office should she have any additional questions or concerns.

## 2023-05-17 ENCOUNTER — Encounter: Admitting: Physician Assistant

## 2023-07-04 LAB — HM MAMMOGRAPHY

## 2023-07-07 ENCOUNTER — Ambulatory Visit: Payer: Medicare PPO

## 2023-07-07 VITALS — BP 120/64 | HR 68 | Temp 98.2°F | Ht 68.0 in | Wt 108.6 lb

## 2023-07-07 DIAGNOSIS — Z0000 Encounter for general adult medical examination without abnormal findings: Secondary | ICD-10-CM | POA: Diagnosis not present

## 2023-07-07 LAB — HM DEXA SCAN

## 2023-07-07 NOTE — Progress Notes (Signed)
 Subjective:   Latoya Wright is a 78 y.o. who presents for a Medicare Wellness preventive visit.  As a reminder, Annual Wellness Visits don't include a physical exam, and some assessments may be limited, especially if this visit is performed virtually. We may recommend an in-person follow-up visit with your provider if needed.  Visit Complete: In person    Persons Participating in Visit: Patient.  AWV Questionnaire: No: Patient Medicare AWV questionnaire was not completed prior to this visit.  Cardiac Risk Factors include: advanced age (>64men, >81 women);dyslipidemia     Objective:    Today's Vitals   07/07/23 0910  BP: 120/64  Pulse: 68  Temp: 98.2 F (36.8 C)  SpO2: 99%  Weight: 108 lb 9.6 oz (49.3 kg)  Height: 5' 8 (1.727 m)   Body mass index is 16.51 kg/m.     07/07/2023    9:21 AM 07/01/2022    9:46 AM 04/17/2021   12:31 AM 12/24/2020    4:25 PM 12/03/2020   11:55 AM 10/17/2020    2:38 AM 10/16/2020    2:39 PM  Advanced Directives  Does Patient Have a Medical Advance Directive? Yes Yes No No Yes Yes No  Type of Estate agent of [place];Living will Healthcare Power of [place];Living will    Living will;Healthcare Power of Attorney   Does patient want to make changes to medical advance directive?    No - Patient declined No - Patient declined No - Patient declined   Copy of Healthcare Power of Attorney in Chart? No - copy requested No - copy requested    No - copy requested   Would patient like information on creating a medical advance directive?   Yes (ED - Information included in AVS) No - Patient declined  No - Patient declined No - Patient declined    Current Medications (verified) Outpatient Encounter Medications as of 07/07/2023  Medication Sig   amLODipine  (NORVASC ) 2.5 MG tablet Take 1 tablet (2.5 mg total) by mouth daily.   aspirin  EC 81 MG EC tablet Take 1 tablet (81 mg total) by mouth daily. Swallow whole.   Biotin 5 MG TABS Take 5  mg by mouth daily.   calcium -vitamin D (OSCAL 500/200 D-3) 500-200 MG-UNIT tablet Take 1 tablet by mouth 2 (two) times daily.   Magnesium 500 MG CAPS Take by mouth.   tretinoin (RETIN-A) 0.05 % cream Apply 1 Application topically every other day.   VITAMIN D PO Take by mouth daily.   Menaquinone-7 (VITAMIN K2 PO) Take by mouth. (Patient not taking: Reported on 07/07/2023)   triamcinolone  ointment (KENALOG ) 0.1 %  (Patient not taking: Reported on 07/07/2023)   Zinc 50 MG TABS Take by mouth. (Patient not taking: Reported on 07/07/2023)   No facility-administered encounter medications on file as of 07/07/2023.    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   Procedure: TOTAL HIP ARTHROPLASTY ANTERIOR APPROACH;  Surgeon: Arnie Lao, MD;  Location: [facility];  Service: Orthopedics;  Laterality: Left;   Family History  Problem Relation Age of Onset   Heart disease Father    Heart disease Maternal Grandmother    Heart disease Maternal Grandfather    Heart disease Paternal Grandmother    Heart disease Paternal Grandfather  Rheum arthritis Sister    Rheum arthritis Brother    Social History   Socioeconomic History   Marital status: Married    Spouse name: Not on file   Number of children: 2   Years of education: Not on file   Highest education level: Not on file  Occupational History   Occupation: Engineer, site - Retired  Tobacco Use   Smoking status: Never   Smokeless tobacco: Never  Substance and Sexual Activity   Alcohol use: No   Drug use: No   Sexual activity: Not on file  Other Topics Concern   Not on file  Social History Narrative   Not on file   Social Drivers of Health   Financial Resource Strain: Low Risk  (07/07/2023)    Overall Financial Resource Strain (CARDIA)    Difficulty of Paying Living Expenses: Not hard at all  Food Insecurity: No Food Insecurity (07/07/2023)   Hunger Vital Sign    Worried About Running Out of Food in the Last Year: Never true    Ran Out of Food in the Last Year: Never true  Transportation Needs: No Transportation Needs (07/07/2023)   PRAPARE - Administrator, Civil Service (Medical): No    Lack of Transportation (Non-Medical): No  Physical Activity: Sufficiently Active (07/07/2023)   Exercise Vital Sign    Days of Exercise per Week: 5 days    Minutes of Exercise per Session: 60 min  Stress: No Stress Concern Present (07/07/2023)   [employer] of Occupational Health - Occupational Stress Questionnaire    Feeling of Stress: Not at all  Social Connections: Moderately Integrated (07/07/2023)   Social Connection and Isolation Panel    Frequency of Communication with Friends and Family: More than three times a week    Frequency of Social Gatherings with Friends and Family: More than three times a week    Attends Religious Services: More than 4 times per year    Active Member of [employer] or Organizations: No    Attends Engineer, structural: Never    Marital Status: Married    Tobacco Counseling Counseling given: Not Answered    Clinical Intake:  Pre-visit preparation completed: Yes  Pain : No/denies pain     BMI - recorded: 16.51 Nutritional Status: BMI <19  Underweight Nutritional Risks: None Diabetes: No  Lab Results  Component Value Date   HGBA1C 5.3 07/19/2022   HGBA1C 5.3 10/17/2020     How often do you need to have someone help you when you read instructions, pamphlets, or other written materials from your doctor or pharmacy?: 1 - Never  Interpreter Needed?: No  Information entered by :: Lamont Pilsner, LPN   Activities of Daily Living     07/07/2023    9:16 AM  In your present state of health, do you have any difficulty  performing the following activities:  Hearing? 0  Vision? 0  Difficulty concentrating or making decisions? 0  Walking or climbing stairs? 0  Dressing or bathing? 0  Doing errands, shopping? 0  Preparing Food and eating ? N  Using the Toilet? N  In the past six months, have you accidently leaked urine? N  Do you have problems with loss of bowel control? N  Managing your Medications? N  Managing your Finances? N  Housekeeping or managing your Housekeeping? N    Patient Care Team: Rodney Clamp, MD as PCP - General (Family Medicine) O'Neal, Cathay Clonts, MD as PCP -  Cardiology (Cardiology)  I have updated your Care Teams any recent Medical Services you may have received from other providers in the past year.     Assessment:   This is a routine wellness examination for Latoya Wright.  Hearing/Vision screen Hearing Screening - Comments:: Pt denies any hearing issues  Vision Screening - Comments:: Wears rx glasses - up to date with routine eye exams with Dr Alvina Axon     Goals Addressed             This Visit's Progress    Patient Stated       Maintain health and activity        Depression Screen     07/07/2023    9:18 AM 11/10/2022    8:41 AM 09/01/2022    9:40 AM 07/19/2022    9:59 AM 07/01/2022    9:45 AM 03/22/2022   11:31 AM 05/19/2021   11:00 AM  PHQ 2/9 Scores  PHQ - 2 Score 0  0 0 0 0 0  Exception Documentation  Patient refusal         Fall Risk     07/07/2023    9:21 AM 11/10/2022    8:03 AM 09/01/2022    9:40 AM 07/19/2022    9:59 AM 07/01/2022    9:48 AM  Fall Risk   Falls in the past year? 1 0 0 0 0  Number falls in past yr: 1 0 0 0 0  Injury with Fall? 1 0 0 0 0  Comment bruised knees at gym fall      Risk for fall due to : History of fall(s) No Fall Risks No Fall Risks No Fall Risks Impaired vision;Impaired balance/gait  Follow up Falls prevention discussed    Falls prevention discussed    MEDICARE RISK AT HOME:  Medicare Risk at Home Any stairs in  or around the home?: Yes If so, are there any without handrails?: No Home free of loose throw rugs in walkways, pet beds, electrical cords, etc?: Yes Adequate lighting in your home to reduce risk of falls?: Yes Life alert?: No Use of a cane, walker or w/c?: No Grab bars in the bathroom?: No Shower chair or bench in shower?: Yes Elevated toilet seat or a handicapped toilet?: No  TIMED UP AND GO:  Was the test performed?  Yes  Length of time to ambulate 10 feet: 10 sec Gait slow and steady without use of assistive device  Cognitive Function: 6CIT completed        07/07/2023    9:22 AM 07/01/2022    9:50 AM  6CIT Screen  What Year? 0 points 0 points  What month? 0 points 0 points  What time? 0 points 0 points  Count back from 20 0 points 0 points  Months in reverse 0 points 0 points  Repeat phrase 0 points 0 points  Total Score 0 points 0 points    Immunizations Immunization History  Administered Date(s) Administered   PFIZER(Purple Top)SARS-COV-2 Vaccination 03/11/2019, 04/03/2019, 11/23/2019, 08/11/2020   Pneumococcal Conjugate-13 12/12/2013   Pneumococcal Polysaccharide-23 11/10/2011   Td 08/21/2020   Tdap 01/26/2004   Zoster Recombinant(Shingrix) 08/21/2020   Zoster, Live 01/25/2009    Screening Tests Health Maintenance  Topic Date Due   Zoster Vaccines- Shingrix (2 of 2) 10/16/2020   MAMMOGRAM  06/28/2023   Hepatitis C Screening  07/19/2023 (Originally 07/19/1963)   INFLUENZA VACCINE  08/26/2023   Medicare Annual Wellness (AWV)  07/06/2024   DEXA  SCAN  08/19/2024   DTaP/Tdap/Td (3 - Td or Tdap) 08/22/2030   Pneumococcal Vaccine: 50+ Years  Completed   HPV VACCINES  Aged Out   Meningococcal B Vaccine  Aged Out   Colonoscopy  Discontinued   COVID-19 Vaccine  Discontinued    Health Maintenance  Health Maintenance Due  Topic Date Due   Zoster Vaccines- Shingrix (2 of 2) 10/16/2020   MAMMOGRAM  06/28/2023   Health Maintenance Items Addressed: See Nurse  Notes at the end of this note  Additional Screening:  Vision Screening: Recommended annual ophthalmology exams for early detection of glaucoma and other disorders of the eye. Would you like a referral to an eye doctor? No    Dental Screening: Recommended annual dental exams for proper oral hygiene  Community Resource Referral / Chronic Care Management: CRR required this visit?  No   CCM required this visit?  No   Plan:    I have personally reviewed and noted the following in the patient's chart:   Medical and social history Use of alcohol, tobacco or illicit drugs  Current medications and supplements including opioid prescriptions. Patient is not currently taking opioid prescriptions. Functional ability and status Nutritional status Physical activity Advanced directives List of other physicians Hospitalizations, surgeries, and ER visits in previous 12 months Vitals Screenings to include cognitive, depression, and falls Referrals and appointments  In addition, I have reviewed and discussed with patient certain preventive protocols, quality metrics, and best practice recommendations. A written personalized care plan for preventive services as well as general preventive health recommendations were provided to patient.   Bruno Capri, LPN   07/15/3084   After Visit Summary: (In Person-Declined) Patient declined AVS at this time.  Notes: Nothing significant to report at this time.

## 2023-07-07 NOTE — Patient Instructions (Signed)
 [name] , Thank you for taking time out of your busy schedule to complete your Annual Wellness Visit with me. I enjoyed our conversation and look forward to speaking with you again next year. I, as well as your care team,  appreciate your ongoing commitment to your health goals. Please review the following plan we discussed and let me know if I can assist you in the future. Your Game plan/ To Do List    Referrals: If you haven't heard from the office you've been referred to, please reach out to them at the phone provided.   Follow up Visits: Next Medicare AWV with our clinical staff: 07/12/24   Have you seen your provider in the last 6 months (3 months if uncontrolled diabetes)? No Next Office Visit with your provider: pt will scheduled appt for follow up physical   Clinician Recommendations:  Aim for 30 minutes of exercise or brisk walking, 6-8 glasses of water, and 5 servings of fruits and vegetables each day.       This is a list of the screening recommended for you and due dates:  Health Maintenance  Topic Date Due   Zoster (Shingles) Vaccine (2 of 2) 10/16/2020   Mammogram  06/28/2023   Hepatitis C Screening  07/19/2023*   Flu Shot  08/26/2023   Medicare Annual Wellness Visit  07/06/2024   DEXA scan (bone density measurement)  08/19/2024   DTaP/Tdap/Td vaccine (3 - Td or Tdap) 08/22/2030   Pneumococcal Vaccine for age over 33  Completed   HPV Vaccine  Aged Out   Meningitis B Vaccine  Aged Out   Colon Cancer Screening  Discontinued   COVID-19 Vaccine  Discontinued  *Topic was postponed. The date shown is not the original due date.    Advanced directives: (Copy Requested) Please bring a copy of your health care power of attorney and living will to the office to be added to your chart at your convenience. You can mail to [facility] [address] [place], [place] [address] or email to [email or web address] Advance Care Planning is important because  it:  [x]  Makes sure you receive the medical care that is consistent with your values, goals, and preferences  [x]  It provides guidance to your family and loved ones and reduces their decisional burden about whether or not they are making the right decisions based on your wishes.  Follow the link provided in your after visit summary or read over the paperwork we have mailed to you to help you started getting your Advance Directives in place. If you need assistance in completing these, please reach out to us  so that we can help you!  See attachments for Preventive Care and Fall Prevention Tips.

## 2023-07-12 ENCOUNTER — Encounter: Payer: Self-pay | Admitting: Physician Assistant

## 2023-07-12 ENCOUNTER — Ambulatory Visit (INDEPENDENT_AMBULATORY_CARE_PROVIDER_SITE_OTHER)

## 2023-07-12 ENCOUNTER — Ambulatory Visit: Admitting: Physician Assistant

## 2023-07-12 VITALS — BP 156/76 | HR 72 | Temp 98.1°F | Ht 68.0 in | Wt 108.0 lb

## 2023-07-12 DIAGNOSIS — R0781 Pleurodynia: Secondary | ICD-10-CM | POA: Diagnosis not present

## 2023-07-12 DIAGNOSIS — M810 Age-related osteoporosis without current pathological fracture: Secondary | ICD-10-CM | POA: Diagnosis not present

## 2023-07-12 NOTE — Progress Notes (Signed)
 Patient ID: Latoya Wright, female    DOB: Mar 29, 1945, 78 y.o.   MRN: [id number]   Assessment & Plan:  Rib pain on right side -     DG Ribs Unilateral Right; Future  Osteoporosis, unspecified osteoporosis type, unspecified pathological fracture presence      Assessment and Plan Assessment & Plan Right rib pain Right rib pain following an incident while leaning over in the car. Pain is localized to the right lower rib area, exacerbated by certain movements, but not by coughing or sneezing. No history of fracture or spontaneous bone breaks. Differential diagnosis includes muscle strain or rib fracture. - Order x-ray of right ribs to assess for fracture - Advise to avoid overexertion and strenuous activities until diagnosis is confirmed  Osteoporosis Osteoporosis with recent DEXA scan showing worsening bone density, T-score of -3.1 at the right femoral neck and -3.2 at the left radius. Previously on Fosamax and Prolia , she personally discontinued Prolia  due to concerns about jaw deterioration. Need to look at newer options. She is concerned about side effects and is hesitant to start new treatments without further consultation.  - Consult with Doctor Daneil Dunker regarding osteoporosis management and potential referral to an osteoporosis clinic - Discuss potential osteoporosis treatments - Continue [facility] program for bone strengthening      No follow-ups on file.    Subjective:    Chief Complaint  Patient presents with   Flank Pain    R side; feels like ribs are bruised, sore to touch; happened Thursday, reached for something and just started hurting; no other injury or fall to recall    Flank Pain   Discussed the use of AI scribe software for clinical note transcription with the patient, who gave verbal consent to proceed.  History of Present Illness Latoya Wright is a 78 year old female with osteoporosis who presents with right rib pain. She is accompanied by her  husband.  She experiences right rib pain after leaning over the console in her car to retrieve an item from the floor last week. The pain was initially significant but has since decreased. It is more noticeable when lying down at night but does not worsen with coughing, sneezing, or twisting. She did not feel any crack or pop at the time of the incident.  She has a history of osteoporosis and recently underwent a bone density test, with a T-score of -3.1 at the right femoral neck and -3.2 at the left radius. She has previously been on Fosamax and Prolia  but discontinued Prolia  due to concerns about side effects. She is currently attending [facility], a program aimed at building bone strength.  She has a history of a stroke and has been cautious about engaging in activities that might lead to fractures. She has not experienced any spontaneous fractures or bone breaks in the past.  She has been teaching physical education for thirty years and had a fall in a gym previously, which did not result in any fractures but did cause bruising.     Past Medical History:  Diagnosis Date   Allergy    Arthritis    Stroke [facility])     Past Surgical History:  Procedure Laterality Date   TOTAL HIP ARTHROPLASTY 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   Procedure: TOTAL HIP ARTHROPLASTY ANTERIOR APPROACH;  Surgeon: Arnie Lao, MD;  Location: [facility];  Service: Orthopedics;  Laterality: Left;    Family History  Problem Relation Age of Onset   Heart disease Father  Heart disease Maternal Grandmother    Heart disease Maternal Grandfather    Heart disease Paternal Grandmother    Heart disease Paternal Grandfather    Rheum arthritis Sister    Rheum arthritis Brother     Social History   Tobacco Use   Smoking status: Never   Smokeless tobacco: Never  Substance Use Topics   Alcohol use: No   Drug use: No     Allergies  Allergen Reactions   Pseudoephedrine-Dm-Gg-Apap Other (See Comments)   Dayquil  [Pseudoephedrine-Apap-Dm]     jittery    Review of Systems  Genitourinary:  Positive for flank pain.   NEGATIVE UNLESS OTHERWISE INDICATED IN HPI      Objective:     BP (!) 156/76   Pulse 72   Temp 98.1 F (36.7 C)   Ht 5' 8 (1.727 m)   Wt 108 lb (49 kg)   SpO2 99%   BMI 16.42 kg/m   Wt Readings from Last 3 Encounters:  07/12/23 108 lb (49 kg)  07/07/23 108 lb 9.6 oz (49.3 kg)  05/02/23 110 lb (49.9 kg)    BP Readings from Last 3 Encounters:  07/12/23 (!) 156/76  07/07/23 120/64  05/09/23 (!) 170/68     Physical Exam Vitals and nursing note reviewed.  Constitutional:      Appearance: Normal appearance. She is underweight.  Chest:    Neurological:     Mental Status: She is alert.             Hashir Deleeuw M Zayneb Baucum, PA-C

## 2023-07-14 ENCOUNTER — Ambulatory Visit: Payer: Self-pay | Admitting: Physician Assistant

## 2023-07-14 DIAGNOSIS — M810 Age-related osteoporosis without current pathological fracture: Secondary | ICD-10-CM

## 2023-07-14 NOTE — Progress Notes (Signed)
 The bone density scan that she had recently showed a T-score of -3.1 which is in the osteoporotic range.  This number is slightly worse than her number last year however this is not an accurate comparison as it was done on separate machines. Recommend referral to osteoporosis clinic to discuss alternative treatment options.

## 2023-07-20 ENCOUNTER — Other Ambulatory Visit: Payer: Self-pay | Admitting: *Deleted

## 2023-08-09 ENCOUNTER — Ambulatory Visit: Admitting: Plastic Surgery

## 2023-08-11 ENCOUNTER — Ambulatory Visit: Payer: Self-pay

## 2023-08-11 ENCOUNTER — Encounter: Payer: Self-pay | Admitting: Family Medicine

## 2023-08-11 ENCOUNTER — Ambulatory Visit: Admitting: Family Medicine

## 2023-08-11 VITALS — BP 154/61 | HR 63 | Temp 97.3°F | Ht 68.0 in | Wt 107.4 lb

## 2023-08-11 DIAGNOSIS — I10 Essential (primary) hypertension: Secondary | ICD-10-CM | POA: Diagnosis not present

## 2023-08-11 DIAGNOSIS — L259 Unspecified contact dermatitis, unspecified cause: Secondary | ICD-10-CM

## 2023-08-11 MED ORDER — METHYLPREDNISOLONE ACETATE 40 MG/ML IJ SUSP
40.0000 mg | Freq: Once | INTRAMUSCULAR | Status: AC
Start: 2023-08-11 — End: 2023-08-11
  Administered 2023-08-11: 80 mg via INTRA_ARTICULAR

## 2023-08-11 MED ORDER — PREDNISONE 10 MG PO TABS: 10.0000 mg | ORAL_TABLET | Freq: Every day | ORAL | 0 refills | Status: DC

## 2023-08-11 NOTE — Patient Instructions (Addendum)
 It was very nice to see you today!  You have rash due to poison ivy.  Will give you a steroid shot today.  Start the prednisone  by mouth tomorrow.  Take this for the next 2 weeks.  He can use triamcinolone  to the area as well.  Let us  know if your symptoms worsen or fail to improve.  Return if symptoms worsen or fail to improve.   Take care, Dr Kennyth  PLEASE NOTE:  If you had any lab tests, please let us  know if you have not heard back within a few days. You may see your results on mychart before we have a chance to review them but we will give you a call once they are reviewed by us .   If we ordered any referrals today, please let us  know if you have not heard from their office within the next week.   If you had any urgent prescriptions sent in today, please check with the pharmacy within an hour of our visit to make sure the prescription was transmitted appropriately.   Please try these tips to maintain a healthy lifestyle:  Eat at least 3 REAL meals and 1-2 snacks per day.  Aim for no more than 5 hours between eating.  If you eat breakfast, please do so within one hour of getting up.   Each meal should contain half fruits/vegetables, one quarter protein, and one quarter carbs (no bigger than a computer mouse)  Cut down on sweet beverages. This includes juice, soda, and sweet tea.   Drink at least 1 glass of water with each meal and aim for at least 8 glasses per day  Exercise at least 150 minutes every week.

## 2023-08-11 NOTE — Telephone Encounter (Signed)
 FYI Only or Action Required?: FYI only for provider.  Patient was last seen in primary care on 11/10/2022 by Latoya Worth HERO, MD.  Called Nurse Triage reporting Rash.  Symptoms began several days ago.  Interventions attempted: OTC medications: Rash Cream.  Symptoms are: gradually worsening.  Triage Disposition: See Physician Within 24 Hours  Patient/caregiver understands and will follow disposition?: Yes       Copied from CRM [id number]. Topic: Clinical - Red Word Triage >> Aug 11, 2023  8:18 AM Latoya Wright wrote: Red Word that prompted transfer to Nurse Triage: The patient has blisters that is either from shingles or poison ivy as she was doing yard work yesterday but states she had her body covered. The patient states that it is itchy and painful. It is all over her back and front and it is spreading. Reason for Disposition  MODERATE to SEVERE itching (e.g., interferes with work, school, sleep, or other activities)  Answer Assessment - Initial Assessment Questions 1. APPEARANCE of RASH: What does the rash look like?      Red, blisters 2. LOCATION: Where is the rash located?  (e.g., face, genitals, hands, legs)     Back, stomach, neck, wrist  3. SIZE: How large is the rash?      Diffused  4. ONSET: When did the rash begin?      Strted 4 days ago  5. ITCHING: Does the rash itch? If Yes, ask: How bad is it?     Yes, itches and burns  6. EXPOSURE:  How were you exposed to the plant (poison ivy, poison oak, sumac)  When were you exposed?  Note: Sometimes a poison ivy/oak/sumac rash does not appear for 2 to 3 weeks after exposure.      Poison ivy  7. PAST HISTORY: Have you had a poison ivy rash before? If Yes, ask: How bad was it?      8. OTHER SYMPTOMS: Do you have any other symptoms? (e.g., fever)      *No Answer* 9. PREGNANCY: Is there any chance you are pregnant? When was your last menstrual period?     *No Answer*  Protocols used: Poison Ivy -  Oak - Sumac-A-AH

## 2023-08-11 NOTE — Progress Notes (Signed)
   Latoya Wright is a 78 y.o. female who presents today for an office visit.  Assessment/Plan:  New/Acute Problems: Rash  Exam and history consistent with contact dermatitis likely due to poison ivy.  Given widespread distribution would be reasonable to start systemic steroids at this point.  Will give 80 mg of Depo-Medrol  to be followed by prednisone  10 mg daily for the next 2 weeks.  She can continue using topical triamcinolone  to the area.  We did discuss strategies for avoiding exposure to poison ivy oil in the future.  We discussed reasons to return to care.  Chronic Problems Addressed Today: Essential hypertension Elevated here though was well-controlled at her previous visit here.  May be elevated in setting of her acute dermatitis secondary poison ivy exposure as above.  She will monitor at home and let us  know if persistently elevated.  Currently on amlodipine  2.5 mg daily for cardiology.     Subjective:  HPI:  Patient here with rash. This started a few days ago.  Believes she was exposed to poison ivy while helping a friend trim bushes a few days ago.  She has used topical triamcinolone  to the area with some improvement.  Rash located on both arms, neck, and bilateral flanks.       Objective:  Physical Exam: BP (!) 154/61   Pulse 63   Temp (!) 97.3 F (36.3 C) (Temporal)   Ht 5' 8 (1.727 m)   Wt 107 lb 6.4 oz (48.7 kg)   SpO2 100%   BMI 16.33 kg/m   Gen: No acute distress, resting comfortably  Skin: Linear excoriated vesicular erythematous rash on right wrist, bilateral flanks, and neck. Neuro: Grossly normal, moves all extremities Psych: Normal affect and thought content      Cordie Beazley M. Kennyth, MD 08/11/2023 10:18 AM

## 2023-08-11 NOTE — Assessment & Plan Note (Addendum)
 Elevated here though was well-controlled at her previous visit here.  May be elevated in setting of her acute dermatitis secondary poison ivy exposure as above.  She will monitor at home and let us  know if persistently elevated.  Currently on amlodipine  2.5 mg daily for cardiology.

## 2023-08-11 NOTE — Telephone Encounter (Signed)
 Appt today

## 2023-08-24 NOTE — Progress Notes (Signed)
 Cardiology Office Note:    Date:  09/06/2023   ID:  Latoya Wright, DOB [birth year], MRN [id number]  PCP:  Kennyth Worth HERO, MD  Cardiologist:  Darryle ONEIDA Decent, MD     Referring MD: Kennyth Worth HERO, MD   Chief Complaint: follow-up of palpitations and hypertension  History of Present Illness:    Latoya Wright is a 78 y.o. female with a history of palpitations with brief SVT and rare PACs/ PVCs noted on monitor in 03/2021,, CVA in 09/2020, hypertension, and hyperlipidemia who is followed by Dr. Decent and presents today for routine follow-up.   Patient has a history of a small vessel stroke in 09/2020. Echo at that time showed LVEF of 60-65% with normal wall motion and no significant valvular disease. Carotid dopplers showed on minimal 1-39% stenosis of bilateral ICAs. Monitor was ordered in 03/2021 for further evaluation of palpitations which showed predominant sinus rhythm with 13 short runs of SVT (longest run 9.4 seconds) and rare PACs/ PVCs but no significant arrhythmias.   She was last seen by me in 02/2023 at which time she was doing well from a cardiac standpoint. Home BP log showed BP was still mildly elevated. She was hesitant to started any medications because her husband has previously struggled with side effects from multiple medications. However, she was ultimately agreeable to starting Amlodipine  2.5mg  daily.   Patient presents today for follow-up. Here with her husband.  She is doing very well from a cardiac standpoint and has no complaints today.  She has been monitoring her BP at home and it has been generally well-controlled with systolic BP in the 130s or low 140s.  ROS: No chest pain, shortness of breath, orthopnea, PND, lower extremity edema, palpitations, lightheadedness, dizziness, syncope.    EKGs/Labs/Other Studies Reviewed:    The following studies were reviewed:  Echocardiogram 10/17/2020: Impressions: 1. Left ventricular ejection fraction, by estimation, is 60 to 65%.  The  left ventricle has normal function. The left ventricle has no regional  wall motion abnormalities. Left ventricular diastolic parameters are  indeterminate.   2. Right ventricular systolic function is normal. The right ventricular  size is normal. There is normal pulmonary artery systolic pressure.   3. The mitral valve is grossly normal. Trivial mitral valve  regurgitation. No evidence of mitral stenosis.   4. The aortic valve was not well visualized. Aortic valve regurgitation  is not visualized. No aortic stenosis is present.  _______________   Monitor 04/20/2021 to 04/27/2021:  Patient had a min HR of 53 bpm (sinus bradycardia), max HR of 197 bpm (supraventricular tachycardia, 2 second duration), and avg HR of 71 bpm (normal sinus rhythm). Predominant underlying rhythm was Sinus Rhythm. 13 Supraventricular Tachycardia runs occurred, the run with the fastest interval lasting 6 beats (2 seconds) with a max rate of 197 bpm, the longest lasting 9.4 secs with an avg rate of 138 bpm.Episodes appear to be brief atrial tachycardia. Isolated SVEs were rare (<1.0%), SVE Couplets were rare (<1.0%), and SVE Triplets were rare (<1.0%). Isolated VEs were rare (<1.0%), VE Couplets were rare (<1.0%), and no VE Triplets were present.   Impression: 1. Brief supraventricular tachycardia detected (13 episodes in 6 days; longest episode 9.4 seconds).  2. Rare ectopy.   EKG:  EKG not ordered today.   Recent Labs: 09/28/2022: ALT 26; BUN 20; Creatinine, Ser 0.88; Potassium 4.3; Sodium 140  Recent Lipid Panel    Component Value Date/Time   CHOL 148 07/19/2022 1030  CHOL 182 04/15/2021 0813   TRIG 49.0 07/19/2022 1030   HDL 71.10 07/19/2022 1030   HDL 102 04/15/2021 0813   CHOLHDL 2 07/19/2022 1030   VLDL 9.8 07/19/2022 1030   LDLCALC 67 07/19/2022 1030   LDLCALC 69 04/15/2021 0813    Physical Exam:    Vital Signs: BP (!) 140/62   Pulse 71   Ht 5' 8 (1.727 m)   Wt 107 lb 12.8 oz (48.9 kg)    SpO2 98%   BMI 16.39 kg/m     Wt Readings from Last 3 Encounters:  09/06/23 107 lb 12.8 oz (48.9 kg)  08/11/23 107 lb 6.4 oz (48.7 kg)  07/12/23 108 lb (49 kg)     General: 78 y.o. Caucasian female in no acute distress. HEENT: Normocephalic and atraumatic. Sclera clear.  Neck: Supple. No carotid bruits. No JVD. Heart: RRR. Distinct S1 and S2. No murmurs, gallops, or rubs.  Lungs: No increased work of breathing. Clear to ausculation bilaterally. No wheezes, rhonchi, or rales.  Extremities: No lower extremity edema.   Skin: Warm and dry. Neuro: No focal deficits. Psych: Normal affect. Responds appropriately.  Assessment:    1. Primary hypertension   2. Palpitations   3. Hyperlipidemia, unspecified hyperlipidemia type   4. History of CVA (cerebrovascular accident)     Plan:    Hypertension BP mildly elevated in the office. Initially 152/68 and then 140/62 on my personal recheck at the end of the visit. Systolic BP typically in the 130s or low 140s at home. - Continue Amlodipine  2.5mg  daily.  - She has been hesitant to be on BP medications in the past due to concern about side effects. She is a thin 78 year old female so we previously decided on a systolic BP goal of <140. It looks like she is right around this at home so will continue current dose of Amlodipine . Advised patient to let us  know if systolic BP consistently >140 at home.   Palpitations Patient has a history of palpitations. Monitor in 03/2021 showed predominant sinus rhythm with 13 short runs of SVT (longest run 9.4 seconds) and rare PACs/ PVCs but no significant arrhythmias.  - Stable. No recent palpitations.    Hyperlipidemia Lipid panel in 06/2022: Total Cholesterol 148, Triglycerides 49, HDL 71, LDL 67.  - She was previously on a statin but this was stopped due to elevated LFTs and a diagnosis of fatty liver disease. - Labs followed by PCP. She is scheduled to have her annual physical in 10/2023 and labs should  be draw at that time. Will make myself a reminder to follow-up on lipid panel. If she has had a large increase in her LDL, we may need to consider an alternative therapy.   History of CVA History of CVA in 09/2020.  - Continue Aspirin  81mg  daily for secondary prevention.  - Not on statin as above.   Disposition: Follow up in 6 months (she preferred a 6 month follow-up rather than a 12 month follow-up).     Signed, Aline FORBES Door, PA-C  09/06/2023 11:07 AM    [facility]

## 2023-08-25 ENCOUNTER — Other Ambulatory Visit: Payer: Self-pay

## 2023-08-25 MED ORDER — AMLODIPINE BESYLATE 2.5 MG PO TABS: 2.5000 mg | ORAL_TABLET | Freq: Every day | ORAL | 2 refills | Status: AC

## 2023-09-06 ENCOUNTER — Encounter: Payer: Self-pay | Admitting: Student

## 2023-09-06 ENCOUNTER — Ambulatory Visit: Attending: Student | Admitting: Student

## 2023-09-06 VITALS — BP 140/62 | HR 71 | Ht 68.0 in | Wt 107.8 lb

## 2023-09-06 DIAGNOSIS — E785 Hyperlipidemia, unspecified: Secondary | ICD-10-CM | POA: Diagnosis not present

## 2023-09-06 DIAGNOSIS — Z8673 Personal history of transient ischemic attack (TIA), and cerebral infarction without residual deficits: Secondary | ICD-10-CM

## 2023-09-06 DIAGNOSIS — I10 Essential (primary) hypertension: Secondary | ICD-10-CM

## 2023-09-06 DIAGNOSIS — R002 Palpitations: Secondary | ICD-10-CM | POA: Diagnosis not present

## 2023-09-06 NOTE — Patient Instructions (Signed)
 Medication Instructions:  No changes. Continue current medications. *If you need a refill on your cardiac medications before your next appointment, please call your pharmacy*  Lab Work: None. If you have labs (blood work) drawn today and your tests are completely normal, you will receive your results only by: MyChart Message (if you have MyChart) OR A paper copy in the mail If you have any lab test that is abnormal or we need to change your treatment, we will call you to review the results.  Testing/Procedures: None.  Follow-Up: At [facility], you and your health needs are our priority.  As part of our continuing mission to provide you with exceptional heart care, our providers are all part of one team.  This team includes your primary Cardiologist (physician) and Advanced Practice Providers or APPs (Physician Assistants and Nurse Practitioners) who all work together to provide you with the care you need, when you need it.  Your next appointment:   6 month(s)  Provider:   Darryle ONEIDA Decent, MD or Aline Door, PA-C  We recommend signing up for the patient portal called MyChart.  Sign up information is provided on this After Visit Summary.  MyChart is used to connect with patients for Virtual Visits (Telemedicine).  Patients are able to view lab/test results, encounter notes, upcoming appointments, etc.  Non-urgent messages can be sent to your provider as well.   To learn more about what you can do with MyChart, go to [email or web address].   Other Instructions Please let us  know if systolic BP (top number) is consistently >140.

## 2023-09-08 ENCOUNTER — Ambulatory Visit: Admitting: Physician Assistant

## 2023-09-08 ENCOUNTER — Ambulatory Visit: Payer: Self-pay

## 2023-09-08 ENCOUNTER — Encounter: Payer: Self-pay | Admitting: Physician Assistant

## 2023-09-08 VITALS — BP 163/69 | HR 77 | Temp 97.9°F | Ht 66.0 in | Wt 105.2 lb

## 2023-09-08 DIAGNOSIS — M810 Age-related osteoporosis without current pathological fracture: Secondary | ICD-10-CM | POA: Diagnosis not present

## 2023-09-08 DIAGNOSIS — L259 Unspecified contact dermatitis, unspecified cause: Secondary | ICD-10-CM

## 2023-09-08 DIAGNOSIS — I10 Essential (primary) hypertension: Secondary | ICD-10-CM

## 2023-09-08 NOTE — Patient Instructions (Signed)
 VISIT SUMMARY: During your visit, we discussed the spreading rash you developed after exposure to poison ivy. We also reviewed your osteoporosis management and the impact it has on treating your rash.  YOUR PLAN: RECURRENT SPREADING ALLERGIC CONTACT DERMATITIS: You have a rash that is likely due to poison ivy exposure, which has spread despite previous treatments. -Use triamcinolone  cream on the rash. -Try using Tecnu wash to remove any remaining poison ivy oils. -If the rash continues to spread, we may need to do blood work to rule out other causes. -Avoid additional steroids due to the risk of bone loss from your osteoporosis.  OSTEOPOROSIS: Your osteoporosis limits the use of steroids for your rash due to the risk of bone loss. -Continue taking Fosamax for osteoporosis management.                      Contains text generated by Abridge.                                 Contains text generated by Abridge.

## 2023-09-08 NOTE — Progress Notes (Signed)
 Latoya Wright is a 78 y.o. female here for a follow up of a pre-existing problem.  History of Present Illness:   Chief Complaint  Patient presents with   Rash    No itching.  Spots not gone away since last visit     Discussed the use of AI scribe software for clinical note transcription with the patient, who gave verbal consent to proceed.  History of Present Illness Latoya Wright is a 78 year old female who presents with a spreading rash.  The rash developed after exposure to poison ivy during yard work. Despite a steroid injection of 80 mg DepoMedrol, oral steroids 10 mg daily x 2 weeks, and triamcinolone  cream, the rash has spread bilaterally and towards the midline of her chest. The rash is not pruritic. She has not used the triamcinolone  cream recently and is uncertain if she has any at home.  She has a history of stroke affecting her speech and is currently taking Fosamax for osteoporosis. Her blood pressure is consistently high, with a recent reading of 140/62, and she is on antihypertensive medication.  Her husband has been suffering from shingles since last November, with persistent pain and pruritus. He did not complete the shingles vaccination series. She is experiencing significant stress due to her husband's health issues and concern for her niece, who may have leukemia.  No current itching, burning, or flu-like symptoms. No cough or other systemic symptoms.    Past Medical History:  Diagnosis Date   Allergy    Arthritis    Stroke [facility])      Social History   Tobacco Use   Smoking status: Never   Smokeless tobacco: Never  Substance Use Topics   Alcohol use: No   Drug use: No    Past Surgical History:  Procedure Laterality Date   TOTAL HIP ARTHROPLASTY 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   Procedure: TOTAL HIP ARTHROPLASTY ANTERIOR APPROACH;  Surgeon: Vernetta Lonni GRADE, MD;  Location: [facility];  Service: Orthopedics;  Laterality: Left;    Family History  Problem  Relation Age of Onset   Heart disease Father    Heart disease Maternal Grandmother    Heart disease Maternal Grandfather    Heart disease Paternal Grandmother    Heart disease Paternal Grandfather    Rheum arthritis Sister    Rheum arthritis Brother     Allergies  Allergen Reactions   Pseudoephedrine-Dm-Gg-Apap Other (See Comments)   Dayquil [Pseudoephedrine-Apap-Dm]     jittery    Current Medications:   Current Outpatient Medications:    alendronate (FOSAMAX) 70 MG tablet, take one tablet weekly -- take it upon awakening with a big glass of water and do not have anything else to eat or drink for 30 minutes, Disp: , Rfl:    amLODipine  (NORVASC ) 2.5 MG tablet, Take 1 tablet (2.5 mg total) by mouth daily., Disp: 90 tablet, Rfl: 2   aspirin  EC 81 MG EC tablet, Take 1 tablet (81 mg total) by mouth daily. Swallow whole., Disp: 30 tablet, Rfl: 11   Biotin 5 MG TABS, Take 5 mg by mouth daily., Disp: , Rfl:    Calcium -Vitamin D-Vitamin K (CALCIUM  + D PO), TAKING 3 GUMMIES BY MOUTH DAILY, Disp: , Rfl:    Magnesium 500 MG CAPS, Take by mouth. (Patient taking differently: Take 1 capsule by mouth every morning.), Disp: , Rfl:    Multiple Vitamin (MULTIVITAMIN ADULT PO), Take 1 tablet by mouth every morning., Disp: , Rfl:    tretinoin (  RETIN-A) 0.05 % cream, Apply 1 Application topically every other day., Disp: , Rfl:    triamcinolone  ointment (KENALOG ) 0.1 %, , Disp: , Rfl:    Review of Systems:   Negative unless otherwise specified per HPI.  Vitals:   Vitals:   09/08/23 1049 09/08/23 1129  BP: (!) 166/65 (!) 163/69  Pulse: 77   Temp: 97.9 F (36.6 C)   TempSrc: Temporal   SpO2: 100%   Weight: 105 lb 3.2 oz (47.7 kg)   Height: 5' 6 (1.676 m)      Body mass index is 16.98 kg/m.  Physical Exam:   Physical Exam Constitutional:      Appearance: Normal appearance. She is well-developed.  HENT:     Head: Normocephalic and atraumatic.  Eyes:     General: Lids are normal.      Extraocular Movements: Extraocular movements intact.     Conjunctiva/sclera: Conjunctivae normal.  Pulmonary:     Effort: Pulmonary effort is normal.  Musculoskeletal:        General: Normal range of motion.     Cervical back: Normal range of motion and neck supple.  Skin:    General: Skin is warm and dry.     Comments: Well defined erythematous patches to bilateral flank and lower abdomen  Neurological:     Mental Status: She is alert and oriented to person, place, and time.  Psychiatric:        Attention and Perception: Attention and perception normal.        Mood and Affect: Mood normal.        Behavior: Behavior normal.        Thought Content: Thought content normal.        Judgment: Judgment normal.     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 Osteoporosis limits steroid use. - Advise use of triamcinolone  cream on the rash. - Recommend trying Tecnu wash for potential residual poison ivy oils. - Consider blood work vs biopsy if the rash continues to spread to rule out other causes. - Avoid additional steroids due to osteoporosis and risk of bone loss.  Osteoporosis Osteoporosis complicates dermatitis management due to steroid-related bone loss risk. - Continue Fosamax for osteoporosis management.  Essential hypertension Above goal today No evidence of end-organ damage on my exam Recommend patient monitor home blood pressure at least a few times weekly Continue amlodipine  2.5 mg daily If home monitoring shows consistent elevation, or any symptom(s) develop, recommend reach out to us  for further advice on next steps    Lucie Buttner, PA-C

## 2023-09-08 NOTE — Telephone Encounter (Signed)
 Patient had an OV today

## 2023-09-08 NOTE — Telephone Encounter (Signed)
 FYI Only or Action Required?: FYI only for provider.  Patient was last seen in primary care on 08/11/2023 by Kennyth Worth HERO, MD.  Called Nurse Triage reporting [facility].  Symptoms began several weeks ago.  Interventions attempted: Other: seen in office, Rx medications.  Symptoms are: gradually worsening.  Triage Disposition: See PCP When Office is Open (Within 3 Days)  Patient/caregiver understands and will follow disposition?: Yes                     Copied from CRM #[id number]. Topic: Clinical - Red Word Triage >> Sep 08, 2023  8:05 AM Tinnie BROCKS wrote: Red Word that prompted transfer to Nurse Triage: Redness that is spreading up from a poison ivy rash. Reason for Disposition  Rash lasts > 3 weeks  Answer Assessment - Initial Assessment Questions 1. APPEARANCE of RASH: What does the rash look like?      Red rash 2. LOCATION: Where is the rash located?  (e.g., face, genitals, hands, legs)     Large portion of body 3. SIZE: How large is the rash?      Underarms to hip 4. ONSET: When did the rash begin?      weeks 5. ITCHING: Does the rash itch? If Yes, ask: How bad is it?     no 6. EXPOSURE:  How were you exposed to the plant (poison ivy, poison oak, sumac)  When were you exposed?  Note: Sometimes a poison ivy/oak/sumac rash does not appear for 2 to 3 weeks after exposure.      Poison Ivy 7. PAST HISTORY: Have you had a poison ivy rash before? If Yes, ask: How bad was it?     yes 8. OTHER SYMPTOMS: Do you have any other symptoms? (e.g., fever)      no  Protocols used: Poison Ivy - Oak - Sumac-A-AH

## 2023-10-03 ENCOUNTER — Ambulatory Visit: Payer: Self-pay

## 2023-10-03 ENCOUNTER — Encounter: Payer: Self-pay | Admitting: Family Medicine

## 2023-10-03 ENCOUNTER — Ambulatory Visit: Admitting: Family Medicine

## 2023-10-03 VITALS — BP 146/67 | HR 75 | Temp 97.7°F | Ht 66.0 in | Wt 105.8 lb

## 2023-10-03 DIAGNOSIS — R21 Rash and other nonspecific skin eruption: Secondary | ICD-10-CM | POA: Diagnosis not present

## 2023-10-03 DIAGNOSIS — I10 Essential (primary) hypertension: Secondary | ICD-10-CM

## 2023-10-03 NOTE — Assessment & Plan Note (Signed)
 Mildly elevated today.  She was well-controlled at previous office visits.  She will continue current regimen amlodipine  2.5 mg daily and let us  know if not improving.

## 2023-10-03 NOTE — Patient Instructions (Signed)
 It was very nice to see you today!  VISIT SUMMARY: You visited us  today for a persistent skin rash that has been present for six to seven weeks. The rash has not responded well to previous treatments, including triamcinolone  cream and oral steroids.  YOUR PLAN: PERSISTENT DERMATITIS: You have a persistent skin rash that has not improved with previous treatments. The rash is widespread and changes color but does not itch or burn. -We will perform a punch biopsy to get a definitive diagnosis of the rash. -Based on the biopsy results, we may consider prescribing a stronger topical corticosteroid.  Return if symptoms worsen or fail to improve.   Take care, Dr Kennyth  PLEASE NOTE:  If you had any lab tests, please let us  know if you have not heard back within a few days. You may see your results on mychart before we have a chance to review them but we will give you a call once they are reviewed by us .   If we ordered any referrals today, please let us  know if you have not heard from their office within the next week.   If you had any urgent prescriptions sent in today, please check with the pharmacy within an hour of our visit to make sure the prescription was transmitted appropriately.   Please try these tips to maintain a healthy lifestyle:  Eat at least 3 REAL meals and 1-2 snacks per day.  Aim for no more than 5 hours between eating.  If you eat breakfast, please do so within one hour of getting up.   Each meal should contain half fruits/vegetables, one quarter protein, and one quarter carbs (no bigger than a computer mouse)  Cut down on sweet beverages. This includes juice, soda, and sweet tea.   Drink at least 1 glass of water with each meal and aim for at least 8 glasses per day  Exercise at least 150 minutes every week.

## 2023-10-03 NOTE — Telephone Encounter (Signed)
 FYI Only or Action Required?: Action required by provider: request for appointment.  Patient was last seen in primary care on 09/08/2023 by Job Lukes, PA.  Called Nurse Triage reporting Rash.  Symptoms began several weeks ago.  Interventions attempted: Prescription medications: not sure.  Symptoms are: gradually worsening.  Triage Disposition: See PCP When Office is Open (Within 3 Days)  Patient/caregiver understands and will follow disposition?: YesCopied from CRM [id number]. Topic: Clinical - Red Word Triage >> Oct 03, 2023  8:08 AM Fonda T wrote: Red Word that prompted transfer to Nurse Triage: Patient calling for inflamed rash that is spreading from waist to back, and all over.   Patient reports, a shot and prednisone  was given, it helped initially, but now symptoms are back and are worsening. Reason for Disposition  Mild widespread rash  (Exception: Heat rash lasting 3 days or less.)  Answer Assessment - Initial Assessment Questions Pt has been seeing numerous times. Dermatology has no clue either.  Rash never went away. Redness fades, but keeps coming back.     1. APPEARANCE of RASH: What does the rash look like? (e.g., blisters, dry flaky skin, red spots, redness, sores)     Red patches 2. SIZE: How big are the spots? (e.g., tip of pen, eraser, coin; inches, centimeters)     Different sizes 3. LOCATION: Where is the rash located?     Both hips, back, top of bottom 4. COLOR: What color is the rash? (Note: It is difficult to assess rash color in people with darker-colored skin. When this situation occurs, simply ask the caller to describe what they see.)     red 5. ONSET: When did the rash begin?     8/14 6. FEVER: Do you have a fever? If Yes, ask: What is your temperature, how was it measured, and when did it start?     denies 7. ITCHING: Does the rash itch? If Yes, ask: How bad is the itch? (Scale 1-10; or mild, moderate, severe)     Mild  8.  CAUSE: What do you think is causing the rash?     Not sure 9. MEDICINE FACTORS: Have you started any new medicines within the last 2 weeks? (e.g., antibiotics)      na 10. OTHER SYMPTOMS: Do you have any other symptoms? (e.g., dizziness, headache, sore throat, joint pain)       denies  Protocols used: Rash or Redness - [facility]

## 2023-10-03 NOTE — Progress Notes (Signed)
 Latoya Wright is a 78 y.o. female who presents today for an office visit.  Assessment/Plan:  New/Acute Problems: Rash  Patient has been seen by multiple providers for this over the last 6 weeks.  No she thought to be contact dermatitis however still has persistent symptoms despite systemic steroids and topical triamcinolone .  We discussed further treatment options including having her follow back up with dermatology versus biopsy today.  She would like to proceed with biopsy today.  See below procedure note.  She tolerated well.  She can continue the topical triamcinolone  until results return.  Depending on results of above she will need to follow back up with dermatology.  Chronic Problems Addressed Today: Essential hypertension Mildly elevated today.  She was well-controlled at previous office visits.  She will continue current regimen amlodipine  2.5 mg daily and let us  know if not improving.     Subjective:  HPI:  See assessment / plan for status of chronic conditions.   Discussed the use of AI scribe software for clinical note transcription with the patient, who gave verbal consent to proceed.  History of Present Illness Latoya Wright is a 79 year old female who presents with a persistent skin rash.  The rash has been present for approximately six to seven weeks.  Last her for that at this time and we treated her for contact dermatitis with Depo-Medrol  and prednisone  taper.  Her symptoms did improve somewhat however returned after finishing her prednisone  taper.  She was seen here again by a PA at the dermatologist office who prescribed triamcinolone  cream.  She followed back up with them shortly afterwards and was told that the area was proven with triamcinolone  however she feels like the rash has continued to spread.  She also tried washing with a solution and using alcohol, which temporarily lightened the rash but did not resolve it. The rash has spread and changes in color from  light pink to dark.   The rash has spread to areas where her bra sits and is described as rougher and lathered, spreading to her back and chest. No itching, burning, or stinging is associated with the rash.  She has not experienced any fevers, chills, or other systemic symptoms. There have been no changes in soaps, detergents, or lotions that could account for the rash. She is currently using a cream prescribed by a dermatologist, but it has not been effective.          Objective:  Physical Exam: BP (!) 146/67   Pulse 75   Temp 97.7 F (36.5 C) (Temporal)   Ht 5' 6 (1.676 m)   Wt 105 lb 12.8 oz (48 kg)   SpO2 99%   BMI 17.08 kg/m   Gen: No acute distress, resting comfortably CV: Regular rate and rhythm with no murmurs appreciated Pulm: Normal work of breathing, clear to auscultation bilaterally with no crackles, wheezes, or rhonchi Skin: Several discrete scattered erythematous macules ranging from 1 cm to 5 cm with well-demarcated borders across the low back. Neuro: Grossly normal, moves all extremities Psych: Normal affect and thought content  Procedure note: After informed written consent was obtained, using Betadine for cleansing and 1% Lidocaine  with epinephrine for anesthetic, with sterile technique a 3 mm punch biopsy was used to obtain a biopsy specimen of the lesion. Hemostasis was obtained by pressure and wound was not sutured. Antibiotic dressing is applied, and wound care instructions provided. Be alert for any signs of cutaneous infection. The  specimen is labeled and sent to pathology for evaluation. The procedure was well tolerated without complications.       Worth HERO. Kennyth, MD 10/03/2023 12:34 PM

## 2023-10-03 NOTE — Telephone Encounter (Signed)
 Appt today

## 2023-10-05 LAB — DERMATOLOGY PATHOLOGY

## 2023-10-06 ENCOUNTER — Telehealth: Payer: Self-pay | Admitting: *Deleted

## 2023-10-06 ENCOUNTER — Ambulatory Visit: Payer: Self-pay | Admitting: Family Medicine

## 2023-10-06 NOTE — Telephone Encounter (Signed)
 Copied from CRM #[id number]. Topic: Clinical - Lab/Test Results >> Oct 06, 2023  1:49 PM Tysheama G wrote: Reason for CRM: Patient wanted to know if her lab results are in from her biopsy. Callback number [phone]   See results note  Loma [place],RMA

## 2023-10-06 NOTE — Progress Notes (Signed)
 Her biopsy shows that her rash is due to a hypersensitivity reaction.  It is possible that one of her medications could be causing this if she is not had any changes to any products such as soaps, detergents, lotions that she has been using at home.  If she still having the rash it would be reasonable for us  to try stopping her medications to see if this is causing her rash.  Recommend starting with the Fosamax and stopping it for a few weeks to see if the rash goes away.  I would like for her to follow-up with us  in a few weeks.

## 2023-10-07 ENCOUNTER — Telehealth: Payer: Self-pay | Admitting: *Deleted

## 2023-10-07 NOTE — Telephone Encounter (Signed)
 Copied from CRM #[id number]. Topic: General - Other >> Oct 07, 2023 12:30 PM Jasmin G wrote: Reason for CRM: Pt called regarding recent missed phone call from Ms. Tylisa, Alcivar, RMA, call pt back at [phone] at your earliest convenience.   See results note  Destine Ambroise,RMA

## 2023-10-18 ENCOUNTER — Ambulatory Visit: Admitting: Family Medicine

## 2023-10-18 ENCOUNTER — Encounter: Payer: Self-pay | Admitting: Family Medicine

## 2023-10-18 VITALS — BP 118/62 | HR 71 | Temp 97.6°F | Ht 66.0 in | Wt 104.2 lb

## 2023-10-18 DIAGNOSIS — I10 Essential (primary) hypertension: Secondary | ICD-10-CM

## 2023-10-18 DIAGNOSIS — R21 Rash and other nonspecific skin eruption: Secondary | ICD-10-CM | POA: Diagnosis not present

## 2023-10-18 DIAGNOSIS — M810 Age-related osteoporosis without current pathological fracture: Secondary | ICD-10-CM | POA: Diagnosis not present

## 2023-10-18 NOTE — Assessment & Plan Note (Signed)
 Patient discontinued Fosamax due to recent biopsy results of her rash which showed drug hypersensitivity.  She can discuss with gynecology other options for treating osteoporosis however she would like to avoid Prolia  if possible.  We also did discuss referring her to osteoporosis clinic however she is not interested in this at this point.  She will let us  know if she changes her mind.

## 2023-10-18 NOTE — Progress Notes (Signed)
   Latoya Wright is a 78 y.o. female who presents today for an office visit.  Assessment/Plan:  New/Acute Problems: Rash  Reviewed recent biopsy results which showed hypersensitivity reaction likely due to drug.  She discontinued Fosamax a couple of weeks ago and rash does seem to be improving.  She will avoid this going forward.  Hopefully her rash will continue to improve.  She will let us  know if this does not resolve over the next several weeks.  Chronic Problems Addressed Today: Osteoporosis Patient discontinued Fosamax due to recent biopsy results of her rash which showed drug hypersensitivity.  She can discuss with gynecology other options for treating osteoporosis however she would like to avoid Prolia  if possible.  We also did discuss referring her to osteoporosis clinic however she is not interested in this at this point.  She will let us  know if she changes her mind.  Essential hypertension Blood pressure at goal today on amlodipine  2.5 mg daily.     Subjective:  HPI:  See assessment / plan for status of chronic conditions.  Patient is here today for follow-up.  I last saw her 15 days ago.  At that time we discussed rash which had been present for multiple weeks.  We obtained a biopsy at that time which showed hypersensitivity reaction.  We instructed her to discontinue the Fosamax.  She has been off of this for the last week and a half or so.  Since then she does note that the rash is still present though seems to be fading in color.  Patient does note that she started the Fosamax a few weeks before the rash started.  She has not had any other obvious new exposures over the last few months.         Objective:  Physical Exam: BP 118/62   Pulse 71   Temp 97.6 F (36.4 C) (Temporal)   Ht 5' 6 (1.676 m)   Wt 104 lb 3.2 oz (47.3 kg)   SpO2 98%   BMI 16.82 kg/m   Gen: No acute distress, resting comfortably Skin: Several scattered discrete erythematous macules ranging from 1  cm to 5 cm in diameter with well-demarcated borders seem to be fading in coloration compared to previous exam. Neuro: Grossly normal, moves all extremities Psych: Normal affect and thought content      Yaw Escoto M. Kennyth, MD 10/18/2023 11:10 AM

## 2023-10-18 NOTE — Patient Instructions (Addendum)
 It was very nice to see you today!  Your rash is probably due to the Fosamax.  Please discuss with gynecology alternative treatment options for your osteoporosis.  Also let me know if you would like a referral to see the osteoporosis clinic with [facility].  Please let us  know if your rash does not continue to improve over the next few weeks.  I will see you back soon for your physical.  Please let the front know it is okay to overbook this in any open available slot.  Return if symptoms worsen or fail to improve, for Annual Physical.   Take care, Dr Kennyth  PLEASE NOTE:  If you had any lab tests, please let us  know if you have not heard back within a few days. You may see your results on mychart before we have a chance to review them but we will give you a call once they are reviewed by us .   If we ordered any referrals today, please let us  know if you have not heard from their office within the next week.   If you had any urgent prescriptions sent in today, please check with the pharmacy within an hour of our visit to make sure the prescription was transmitted appropriately.   Please try these tips to maintain a healthy lifestyle:  Eat at least 3 REAL meals and 1-2 snacks per day.  Aim for no more than 5 hours between eating.  If you eat breakfast, please do so within one hour of getting up.   Each meal should contain half fruits/vegetables, one quarter protein, and one quarter carbs (no bigger than a computer mouse)  Cut down on sweet beverages. This includes juice, soda, and sweet tea.   Drink at least 1 glass of water with each meal and aim for at least 8 glasses per day  Exercise at least 150 minutes every week.

## 2023-10-18 NOTE — Assessment & Plan Note (Signed)
Blood pressure at goal today on amlodipine 2.5 mg daily.

## 2023-10-27 ENCOUNTER — Encounter: Admitting: Family Medicine

## 2023-10-28 ENCOUNTER — Ambulatory Visit (INDEPENDENT_AMBULATORY_CARE_PROVIDER_SITE_OTHER): Admitting: Family Medicine

## 2023-10-28 VITALS — BP 138/66 | HR 70 | Temp 97.6°F | Ht 68.0 in | Wt 105.6 lb

## 2023-10-28 DIAGNOSIS — M810 Age-related osteoporosis without current pathological fracture: Secondary | ICD-10-CM | POA: Diagnosis not present

## 2023-10-28 DIAGNOSIS — Z13220 Encounter for screening for lipoid disorders: Secondary | ICD-10-CM | POA: Diagnosis not present

## 2023-10-28 DIAGNOSIS — Z131 Encounter for screening for diabetes mellitus: Secondary | ICD-10-CM | POA: Diagnosis not present

## 2023-10-28 DIAGNOSIS — R21 Rash and other nonspecific skin eruption: Secondary | ICD-10-CM

## 2023-10-28 DIAGNOSIS — I10 Essential (primary) hypertension: Secondary | ICD-10-CM

## 2023-10-28 DIAGNOSIS — Z0001 Encounter for general adult medical examination with abnormal findings: Secondary | ICD-10-CM

## 2023-10-28 LAB — COMPREHENSIVE METABOLIC PANEL WITH GFR
ALT: 18 U/L (ref 0–35)
AST: 21 U/L (ref 0–37)
Albumin: 4 g/dL (ref 3.5–5.2)
Alkaline Phosphatase: 50 U/L (ref 39–117)
BUN: 16 mg/dL (ref 6–23)
CO2: 33 meq/L — ABNORMAL HIGH (ref 19–32)
Calcium: 9.3 mg/dL (ref 8.4–10.5)
Chloride: 106 meq/L (ref 96–112)
Creatinine, Ser: 0.68 mg/dL (ref 0.40–1.20)
GFR: 83.5 mL/min (ref >60.00)
Glucose, Bld: 96 mg/dL (ref 70–99)
Potassium: 4.2 meq/L (ref 3.5–5.1)
Sodium: 144 meq/L (ref 135–145)
Total Bilirubin: 0.6 mg/dL (ref 0.2–1.2)
Total Protein: 6.1 g/dL (ref 6.0–8.3)

## 2023-10-28 LAB — LIPID PANEL
Cholesterol: 196 mg/dL (ref 0–200)
HDL: 86.5 mg/dL (ref >39.00)
LDL Cholesterol: 99 mg/dL (ref 0–99)
NonHDL: 109.58
Total CHOL/HDL Ratio: 2
Triglycerides: 52 mg/dL (ref 0.0–149.0)
VLDL: 10.4 mg/dL (ref 0.0–40.0)

## 2023-10-28 LAB — CBC
HCT: 38.2 % (ref 36.0–46.0)
Hemoglobin: 12.6 g/dL (ref 12.0–15.0)
MCHC: 33 g/dL (ref 30.0–36.0)
MCV: 89.9 fl (ref 78.0–100.0)
Platelets: 206 K/uL (ref 150.0–400.0)
RBC: 4.24 Mil/uL (ref 3.87–5.11)
RDW: 14.8 % (ref 11.5–15.5)
WBC: 5 K/uL (ref 4.0–10.5)

## 2023-10-28 LAB — HEMOGLOBIN A1C: Hgb A1c MFr Bld: 5.6 % (ref 4.6–6.5)

## 2023-10-28 LAB — TSH: TSH: 0.67 u[IU]/mL (ref 0.35–5.50)

## 2023-10-28 NOTE — Progress Notes (Signed)
 Chief Complaint:  Latoya Wright is a 78 y.o. female who presents today for her annual comprehensive physical exam.    Assessment/Plan:  New/Acute Problems: Rash Patient had biopsy performed several weeks ago that showed hypersensitivity reaction.  We asked her to stop the Fosamax about a month ago which did initially seem to help however recently she has had persistence of rash.  She has tried use any over-the-counter athlete's foot cream which may have helped some.  We did discuss that her biopsy result indicated hypersensitivity reaction and would be reasonable for us  to try off a few of her other medications to see if this improves.  She is agreeable this plan.  Will ask her to stop the aspirin  and she will follow-up with us  in a few weeks.  If no improvement with this would consider stopping the amlodipine  versus a oral course of Diflucan given that she did have some improvement with over-the-counter antifungal.  Chronic Problems Addressed Today: Osteoporosis Patient recently discontinued the Fosamax due to concerns that this may be causing her rash.  Unfortunately her rash has persisted - does not seem like Fosamax was causing this.  Will hold off on restarting at this point however may consider restarting at some point in the future if it is determined that her rash was due to some other etiology.  Essential hypertension Blood pressure at goal today on amlodipine  2.5 mg daily.  Preventative Healthcare: Check labs. Declined flu vaccine.  Up-to-date on other vaccines and screenings.  Patient Counseling(The following topics were reviewed and/or handout was given):  -Nutrition: Stressed importance of moderation in sodium/caffeine intake, saturated fat and cholesterol, caloric balance, sufficient intake of fresh fruits, vegetables, and fiber.  -Stressed the importance of regular exercise.   -Substance Abuse: Discussed cessation/primary prevention of tobacco, alcohol, or other drug use;  driving or other dangerous activities under the influence; availability of treatment for abuse.   -Injury prevention: Discussed safety belts, safety helmets, smoke detector, smoking near bedding or upholstery.   -Sexuality: Discussed sexually transmitted diseases, partner selection, use of condoms, avoidance of unintended pregnancy and contraceptive alternatives.   -Dental health: Discussed importance of regular tooth brushing, flossing, and dental visits.  -Health maintenance and immunizations reviewed. Please refer to Health maintenance section.  Return to care in 1 year for next preventative visit.     Subjective:  HPI:  She has no acute complaints today. Patient is here today for her annual physical.  See assessment / plan for status of chronic conditions.  Discussed the use of AI scribe software for clinical note transcription with the patient, who gave verbal consent to proceed.  History of Present Illness Latoya Wright is a 78 year old female who presents with a persistent rash.  She has a persistent rash that remains red and continues to spread, with new spots appearing. She has been using an athlete's foot cream on the rash, which has resulted in some improvement on one side but not the other. She has been off Fosamax for over a month, but the rash persists. She is currently taking amlodipine  and a baby aspirin , which she associates with bruising easily. She has been on amlodipine  and aspirin  for two to three years.  Her diet includes a daily Boost supplement, breakfast with nuts, and occasional brownies. She mentions a lack of appetite and difficulty eating full meals, aiming to maintain her weight at 110 pounds. She enjoys berries, nuts, yogurt, cottage cheese, and cheese cubes.  10/18/2023   10:33 AM  Depression screen PHQ 2/9  Decreased Interest 0  Down, Depressed, Hopeless 0  PHQ - 2 Score 0    There are no preventive care reminders to display for this  patient.   ROS: Per HPI, otherwise a complete review of systems was negative.   PMH:  The following were reviewed and entered/updated in epic: Past Medical History:  Diagnosis Date   Allergy    Arthritis    Stroke [facility])    Patient Active Problem List   Diagnosis Date Noted   Essential hypertension 08/11/2023   Cholelithiasis 09/01/2022   Dyslipidemia 09/01/2022   Paroxysmal SVT (supraventricular tachycardia) 05/19/2021   Adjustment disorder with depressed mood 10/28/2020   Acute CVA (cerebrovascular accident) [facility]) - with RUE > RLE weakness and expressive aphasia 10/16/2020   Underweight 10/14/2020   Stress 10/14/2020   Allergic rhinitis 10/01/2020   Lipoma 08/21/2020   Osteoporosis 08/21/2020   Pulmonary nodules 04/10/2014   Thyroid  nodule 12/12/2013   Hemangioma of liver 12/12/2013   Past Surgical History:  Procedure Laterality Date   TOTAL HIP ARTHROPLASTY 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   Procedure: TOTAL HIP ARTHROPLASTY ANTERIOR APPROACH;  Surgeon: Vernetta Lonni GRADE, MD;  Location: [facility];  Service: Orthopedics;  Laterality: Left;    Family History  Problem Relation Age of Onset   Heart disease Father    Heart disease Maternal Grandmother    Heart disease Maternal Grandfather    Heart disease Paternal Grandmother    Heart disease Paternal Grandfather    Rheum arthritis Sister    Rheum arthritis Brother     Medications- reviewed and updated Current Outpatient Medications  Medication Sig Dispense Refill   amLODipine  (NORVASC ) 2.5 MG tablet Take 1 tablet (2.5 mg total) by mouth daily. 90 tablet 2   aspirin  EC 81 MG EC tablet Take 1 tablet (81 mg total) by mouth daily. Swallow whole. 30 tablet 11   Biotin 5 MG TABS Take 5 mg by mouth daily.     Calcium -Vitamin D-Vitamin K (CALCIUM  + D PO) TAKING 3 GUMMIES BY MOUTH DAILY     Multiple Vitamin (MULTIVITAMIN ADULT PO) Take 1 tablet by mouth every morning.     tretinoin (RETIN-A) 0.05 % cream Apply 1 Application topically  every other day.     alendronate (FOSAMAX) 70 MG tablet take one tablet weekly -- take it upon awakening with a big glass of water and do not have anything else to eat or drink for 30 minutes (Patient not taking: Reported on 10/28/2023)     Magnesium 500 MG CAPS Take by mouth. (Patient not taking: Reported on 10/28/2023)     triamcinolone  cream (KENALOG ) 0.1 % Apply 1 Application topically 2 (two) times daily. (Patient not taking: Reported on 10/28/2023)     triamcinolone  ointment (KENALOG ) 0.1 %  (Patient not taking: Reported on 10/28/2023)     No current facility-administered medications for this visit.    Allergies-reviewed and updated Allergies  Allergen Reactions   Pseudoephedrine-Dm-Gg-Apap Other (See Comments)   Dayquil [Pseudoephedrine-Apap-Dm]     jittery    Social History   Socioeconomic History   Marital status: Married    Spouse name: Not on file   Number of children: 2   Years of education: Not on file   Highest education level: Not on file  Occupational History   Occupation: Engineer, site - Retired  Tobacco Use   Smoking status: Never   Smokeless tobacco: Never  Substance and Sexual Activity  Alcohol use: No   Drug use: No   Sexual activity: Not on file  Other Topics Concern   Not on file  Social History Narrative   Not on file   Social Drivers of Health   Financial Resource Strain: Low Risk  (07/07/2023)   Overall Financial Resource Strain (CARDIA)    Difficulty of Paying Living Expenses: Not hard at all  Food Insecurity: No Food Insecurity (07/07/2023)   Hunger Vital Sign    Worried About Running Out of Food in the Last Year: Never true    Ran Out of Food in the Last Year: Never true  Transportation Needs: No Transportation Needs (07/07/2023)   PRAPARE - Administrator, Civil Service (Medical): No    Lack of Transportation (Non-Medical): No  Physical Activity: Sufficiently Active (07/07/2023)   Exercise Vital Sign    Days of Exercise per  Week: 5 days    Minutes of Exercise per Session: 60 min  Stress: No Stress Concern Present (07/07/2023)   [employer] of Occupational Health - Occupational Stress Questionnaire    Feeling of Stress: Not at all  Social Connections: Moderately Integrated (07/07/2023)   Social Connection and Isolation Panel    Frequency of Communication with Friends and Family: More than three times a week    Frequency of Social Gatherings with Friends and Family: More than three times a week    Attends Religious Services: More than 4 times per year    Active Member of [employer] or Organizations: No    Attends Banker Meetings: Never    Marital Status: Married        Objective:  Physical Exam: BP 138/66   Pulse 70   Temp 97.6 F (36.4 C) (Oral)   Ht 5' 8 (1.727 m)   Wt 105 lb 9.6 oz (47.9 kg)   SpO2 98%   BMI 16.06 kg/m   Body mass index is 16.06 kg/m. Wt Readings from Last 3 Encounters:  10/28/23 105 lb 9.6 oz (47.9 kg)  10/18/23 104 lb 3.2 oz (47.3 kg)  10/03/23 105 lb 12.8 oz (48 kg)   Gen: NAD, resting comfortably HEENT: TMs normal bilaterally. OP clear. No thyromegaly noted.  CV: RRR with no murmurs appreciated Pulm: NWOB, CTAB with no crackles, wheezes, or rhonchi GI: Normal bowel sounds present. Soft, Nontender, Nondistended. MSK: no edema, cyanosis, or clubbing noted Skin: warm, dry. Several scattered discrete erythematous macules ranging from 1 cm to 5 cm diameter with well-demarcated borders across abdomen and back Neuro: CN2-12 grossly intact. Strength 5/5 in upper and lower extremities. Reflexes symmetric and intact bilaterally.  Psych: Normal affect and thought content     Kasheena Sambrano M. Kennyth, MD 10/28/2023 10:31 AM

## 2023-10-28 NOTE — Patient Instructions (Signed)
 It was very nice to see you today!  VISIT SUMMARY: You visited us  today due to a persistent rash and concerns about weight loss and appetite. We discussed potential causes for the rash and made some changes to your medications. We also reviewed your diet and general health maintenance.  YOUR PLAN: RASH: You have a persistent rash that may be due to an allergic reaction, a fungal infection, or a reaction to medication. -Stop taking aspirin  for two weeks to see if the rash improves. -Use ketoconazole cream on the rash as directed. -If the rash does not improve, we may consider prescribing fluconazole.  ESSENTIAL HYPERTENSION: Your high blood pressure is being managed with medication. -Continue taking amlodipine  2.5 mg daily.  GENERAL HEALTH MAINTENANCE: We discussed your diet and nutrition, and there are no concerns about blueberries. -We will order routine blood work including blood counts, A1c, and cholesterol.  Return in about 1 year (around 10/27/2024) for Annual Physical.   Take care, Dr Kennyth  PLEASE NOTE:  If you had any lab tests, please let us  know if you have not heard back within a few days. You may see your results on mychart before we have a chance to review them but we will give you a call once they are reviewed by us .   If we ordered any referrals today, please let us  know if you have not heard from their office within the next week.   If you had any urgent prescriptions sent in today, please check with the pharmacy within an hour of our visit to make sure the prescription was transmitted appropriately.   Please try these tips to maintain a healthy lifestyle:  Eat at least 3 REAL meals and 1-2 snacks per day.  Aim for no more than 5 hours between eating.  If you eat breakfast, please do so within one hour of getting up.   Each meal should contain half fruits/vegetables, one quarter protein, and one quarter carbs (no bigger than a computer mouse)  Cut down on sweet  beverages. This includes juice, soda, and sweet tea.   Drink at least 1 glass of water with each meal and aim for at least 8 glasses per day  Exercise at least 150 minutes every week.    Preventive Care 26 Years and Older, Female Preventive care refers to lifestyle choices and visits with your health care provider that can promote health and wellness. Preventive care visits are also called wellness exams. What can I expect for my preventive care visit? Counseling Your health care provider may ask you questions about your: Medical history, including: Past medical problems. Family medical history. Pregnancy and menstrual history. History of falls. Current health, including: Memory and ability to understand (cognition). Emotional well-being. Home life and relationship well-being. Sexual activity and sexual health. Lifestyle, including: Alcohol, nicotine or tobacco, and drug use. Access to firearms. Diet, exercise, and sleep habits. Work and work Astronomer. Sunscreen use. Safety issues such as seatbelt and bike helmet use. Physical exam Your health care provider will check your: Height and weight. These may be used to calculate your BMI (body mass index). BMI is a measurement that tells if you are at a healthy weight. Waist circumference. This measures the distance around your waistline. This measurement also tells if you are at a healthy weight and may help predict your risk of certain diseases, such as type 2 diabetes and high blood pressure. Heart rate and blood pressure. Body temperature. Skin for abnormal spots. What immunizations  do I need?  Vaccines are usually given at various ages, according to a schedule. Your health care provider will recommend vaccines for you based on your age, medical history, and lifestyle or other factors, such as travel or where you work. What tests do I need? Screening Your health care provider may recommend screening tests for certain  conditions. This may include: Lipid and cholesterol levels. Hepatitis C test. Hepatitis B test. HIV (human immunodeficiency virus) test. STI (sexually transmitted infection) testing, if you are at risk. Lung cancer screening. Colorectal cancer screening. Diabetes screening. This is done by checking your blood sugar (glucose) after you have not eaten for a while (fasting). Mammogram. Talk with your health care provider about how often you should have regular mammograms. BRCA-related cancer screening. This may be done if you have a family history of breast, ovarian, tubal, or peritoneal cancers. Bone density scan. This is done to screen for osteoporosis. Talk with your health care provider about your test results, treatment options, and if necessary, the need for more tests. Follow these instructions at home: Eating and drinking  Eat a diet that includes fresh fruits and vegetables, whole grains, lean protein, and low-fat dairy products. Limit your intake of foods with high amounts of sugar, saturated fats, and salt. Take vitamin and mineral supplements as recommended by your health care provider. Do not drink alcohol if your health care provider tells you not to drink. If you drink alcohol: Limit how much you have to 0-1 drink a day. Know how much alcohol is in your drink. In the [place] one drink equals one 12 oz bottle of beer (355 mL), one 5 oz glass of wine (148 mL), or one 1 oz glass of hard liquor (44 mL). Lifestyle Brush your teeth every morning and night with fluoride toothpaste. Floss one time each day. Exercise for at least 30 minutes 5 or more days each week. Do not use any products that contain nicotine or tobacco. These products include cigarettes, chewing tobacco, and vaping devices, such as e-cigarettes. If you need help quitting, ask your health care provider. Do not use drugs. If you are sexually active, practice safe sex. Use a condom or other form of protection in order to  prevent STIs. Take aspirin  only as told by your health care provider. Make sure that you understand how much to take and what form to take. Work with your health care provider to find out whether it is safe and beneficial for you to take aspirin  daily. Ask your health care provider if you need to take a cholesterol-lowering medicine (statin). Find healthy ways to manage stress, such as: Meditation, yoga, or listening to music. Journaling. Talking to a trusted person. Spending time with friends and family. Minimize exposure to UV radiation to reduce your risk of skin cancer. Safety Always wear your seat belt while driving or riding in a vehicle. Do not drive: If you have been drinking alcohol. Do not ride with someone who has been drinking. When you are tired or distracted. While texting. If you have been using any mind-altering substances or drugs. Wear a helmet and other protective equipment during sports activities. If you have firearms in your house, make sure you follow all gun safety procedures. What's next? Visit your health care provider once a year for an annual wellness visit. Ask your health care provider how often you should have your eyes and teeth checked. Stay up to date on all vaccines. This information is not intended to replace  advice given to you by your health care provider. Make sure you discuss any questions you have with your health care provider. Document Revised: 07/09/2020 Document Reviewed: 07/09/2020 Elsevier Patient Education  2024 [employer].

## 2023-10-28 NOTE — Assessment & Plan Note (Signed)
Blood pressure at goal today on amlodipine 2.5 mg daily.

## 2023-10-28 NOTE — Assessment & Plan Note (Signed)
 Patient recently discontinued the Fosamax due to concerns that this may be causing her rash.  Unfortunately her rash has persisted - does not seem like Fosamax was causing this.  Will hold off on restarting at this point however may consider restarting at some point in the future if it is determined that her rash was due to some other etiology.

## 2023-10-31 ENCOUNTER — Ambulatory Visit: Payer: Self-pay | Admitting: Family Medicine

## 2023-10-31 NOTE — Progress Notes (Signed)
Great news! Labs are all normal. We can recheck again in a year.

## 2023-11-01 ENCOUNTER — Encounter: Payer: Self-pay | Admitting: Plastic Surgery

## 2023-11-01 ENCOUNTER — Ambulatory Visit: Admitting: Plastic Surgery

## 2023-11-01 VITALS — BP 153/64 | HR 79 | Ht 68.0 in | Wt 105.6 lb

## 2023-11-01 DIAGNOSIS — D1722 Benign lipomatous neoplasm of skin and subcutaneous tissue of left arm: Secondary | ICD-10-CM | POA: Diagnosis not present

## 2023-11-01 NOTE — Progress Notes (Signed)
 Procedure Note  Preoperative Dx: Lipoma left arm x 3  Postoperative Dx: Same  Procedure: Excision lipomas left arm x 3 (all were 1 cm in size)  Anesthesia: Lidocaine  1% with 1:100,000 epinephrine  Indication for Procedure: Lipomas  Description of Procedure: Risks and complications were explained to the patient.  Consent was confirmed and the patient understands the risks and benefits.  The potential complications and alternatives were explained and the patient consents.  The patient expressed understanding the option of not having the procedure and the risks of a scar.  Time out was called and all information was confirmed to be correct.    Wrist: The area was prepped and drapped.  Lidocaine  1% with epinephrine was injected in the subcutaneous area.  After waiting several minutes for the local to take affect a #15 blade was used to incise the skin.  The lipoma was identified and easily removed.  The skin edges were reapproximated with 6-0 Monocryl.    Distal to elbow on volar aspect x 2: The area was prepped and drapped.  Lidocaine  1% with epinephrine was injected in the subcutaneous area.  After waiting several minutes for the local to take affect a #15 blade was used to incise the skin.  The lipoma was identified and easily removed x 2.  The skin edges were reapproximated with 6-0 Monocryl.  A dressing was applied.  The patient was given instructions on how to care for the area and a follow up appointment.  Latoya Wright tolerated the procedure well and there were no complications.

## 2023-11-09 NOTE — Progress Notes (Unsigned)
 Patient is a pleasant 78 year old female s/p excision of left arm lipoma x 3 performed in office 11/01/2023 by Dr. Lowery who returns to clinic for postprocedural follow-up.  Reviewed procedural report and the excision sites were closed with 6-0 Monocryl.  Specimens were not sent to pathology.  Today,

## 2023-11-10 ENCOUNTER — Ambulatory Visit (INDEPENDENT_AMBULATORY_CARE_PROVIDER_SITE_OTHER): Payer: Self-pay | Admitting: Physician Assistant

## 2023-11-10 ENCOUNTER — Encounter: Payer: Self-pay | Admitting: Physician Assistant

## 2023-11-10 VITALS — BP 149/64 | HR 66 | Ht 68.0 in | Wt 105.8 lb

## 2023-11-10 DIAGNOSIS — R2232 Localized swelling, mass and lump, left upper limb: Secondary | ICD-10-CM

## 2023-11-10 DIAGNOSIS — H26493 Other secondary cataract, bilateral: Secondary | ICD-10-CM | POA: Diagnosis not present

## 2023-11-10 DIAGNOSIS — Z961 Presence of intraocular lens: Secondary | ICD-10-CM | POA: Diagnosis not present

## 2023-11-10 DIAGNOSIS — H524 Presbyopia: Secondary | ICD-10-CM | POA: Diagnosis not present

## 2023-11-10 DIAGNOSIS — H5212 Myopia, left eye: Secondary | ICD-10-CM | POA: Diagnosis not present

## 2023-11-14 ENCOUNTER — Telehealth: Payer: Self-pay | Admitting: Cardiovascular Disease

## 2023-11-14 ENCOUNTER — Encounter: Payer: Self-pay | Admitting: Family Medicine

## 2023-11-14 NOTE — Telephone Encounter (Signed)
 Patient reports she developed a rash and had a biopsy done that showed she was having an allergic reaction to a medication. It was determined that the rash was caused by baby aspirin . She states that she went off the aspirin  for two weeks and the rash is now gone. She would like to know if there is something else that she should be on instead.

## 2023-11-14 NOTE — Telephone Encounter (Signed)
 Pt c/o medication issue:  1. Name of Medication:   aspirin  EC 81 MG EC tablet   2. How are you currently taking this medication (dosage and times per day)?   3. Are you having a reaction (difficulty breathing--STAT)?   4. What is your medication issue?   Patient stated she recently learned she is allergic to baby aspirin  and has stopped taking this medication.  Patient wants advice on next steps.

## 2023-11-14 NOTE — Telephone Encounter (Signed)
 She is on Aspirin  given her history of CVA not a specific cardiac reason. So would recommend she discuss this with her PCP.  Thanks so much!

## 2023-11-14 NOTE — Telephone Encounter (Signed)
 Advised patient to reach out to PCP per Callie. Patient verbalized understanding.

## 2023-11-15 NOTE — Telephone Encounter (Signed)
 Please advice

## 2023-11-15 NOTE — Telephone Encounter (Signed)
 With her history of stroke it probably would be a good idea for her to be one something to prevent recurrences. Typically in this situation we would start Plavix  75 mg daily. This is similar to the aspirin  but should not cause any issues with the rash she had.  It is ok to send this in or she can schedule an appointment to discuss further if she wishes.  W[employer] HER[employer]. Kennyth, MD 11/15/2023 8:35 AM

## 2023-11-17 NOTE — Telephone Encounter (Signed)
 See note

## 2023-11-17 NOTE — Telephone Encounter (Signed)
 That is fine for her to try it again. She can follow up with us  in a few weeks.  W[employer] HER[employer]. Kennyth, MD 11/17/2023 11:38 AM

## 2023-12-19 ENCOUNTER — Ambulatory Visit: Admitting: Plastic Surgery

## 2023-12-21 ENCOUNTER — Telehealth: Payer: Self-pay

## 2023-12-21 NOTE — Telephone Encounter (Signed)
 Spoke with patient about medical bill concerns. Informed her that if the appointment covered anything outside the annual insurance may not cover. Offered medical billing number.

## 2024-01-17 ENCOUNTER — Ambulatory Visit: Admitting: Plastic Surgery

## 2024-02-28 ENCOUNTER — Ambulatory Visit: Admitting: Plastic Surgery

## 2024-03-06 ENCOUNTER — Ambulatory Visit: Admitting: Plastic Surgery

## 2024-07-12 ENCOUNTER — Ambulatory Visit

## 2024-11-01 ENCOUNTER — Encounter: Admitting: Family Medicine
